# Patient Record
Sex: Male | Born: 1940 | Race: Black or African American | Hispanic: No | Marital: Married | Smoking: Former smoker
Health system: Southern US, Community
[De-identification: ages and names within clinical notes are randomized; demographics above are authoritative.]

## PROBLEM LIST (undated history)

## (undated) DIAGNOSIS — D509 Iron deficiency anemia, unspecified: Secondary | ICD-10-CM

## (undated) DIAGNOSIS — R609 Edema, unspecified: Secondary | ICD-10-CM

## (undated) DIAGNOSIS — M1990 Unspecified osteoarthritis, unspecified site: Secondary | ICD-10-CM

## (undated) DIAGNOSIS — R0609 Other forms of dyspnea: Secondary | ICD-10-CM

## (undated) DIAGNOSIS — R3915 Urgency of urination: Secondary | ICD-10-CM

## (undated) DIAGNOSIS — Z9862 Peripheral vascular angioplasty status: Secondary | ICD-10-CM

## (undated) DIAGNOSIS — E7800 Pure hypercholesterolemia, unspecified: Secondary | ICD-10-CM

## (undated) DIAGNOSIS — R600 Localized edema: Secondary | ICD-10-CM

## (undated) DIAGNOSIS — R350 Frequency of micturition: Secondary | ICD-10-CM

## (undated) DIAGNOSIS — D72829 Elevated white blood cell count, unspecified: Secondary | ICD-10-CM

## (undated) DIAGNOSIS — R011 Cardiac murmur, unspecified: Secondary | ICD-10-CM

## (undated) DIAGNOSIS — IMO0001 Reserved for inherently not codable concepts without codable children: Secondary | ICD-10-CM

## (undated) DIAGNOSIS — N529 Male erectile dysfunction, unspecified: Secondary | ICD-10-CM

## (undated) DIAGNOSIS — I2510 Atherosclerotic heart disease of native coronary artery without angina pectoris: Secondary | ICD-10-CM

## (undated) DIAGNOSIS — Z5189 Encounter for other specified aftercare: Secondary | ICD-10-CM

## (undated) DIAGNOSIS — I10 Essential (primary) hypertension: Secondary | ICD-10-CM

## (undated) DIAGNOSIS — I739 Peripheral vascular disease, unspecified: Secondary | ICD-10-CM

## (undated) DIAGNOSIS — C9210 Chronic myeloid leukemia, BCR/ABL-positive, not having achieved remission: Secondary | ICD-10-CM

## (undated) DIAGNOSIS — Z87438 Personal history of other diseases of male genital organs: Secondary | ICD-10-CM

## (undated) DIAGNOSIS — Z8679 Personal history of other diseases of the circulatory system: Secondary | ICD-10-CM

## (undated) DIAGNOSIS — I509 Heart failure, unspecified: Secondary | ICD-10-CM

## (undated) DIAGNOSIS — N189 Chronic kidney disease, unspecified: Secondary | ICD-10-CM

## (undated) DIAGNOSIS — M1A00X0 Idiopathic chronic gout, unspecified site, without tophus (tophi): Secondary | ICD-10-CM

## (undated) DIAGNOSIS — N179 Acute kidney failure, unspecified: Secondary | ICD-10-CM

## (undated) DIAGNOSIS — R0600 Dyspnea, unspecified: Secondary | ICD-10-CM

## (undated) DIAGNOSIS — K219 Gastro-esophageal reflux disease without esophagitis: Secondary | ICD-10-CM

## (undated) DIAGNOSIS — R351 Nocturia: Secondary | ICD-10-CM

## (undated) DIAGNOSIS — Z862 Personal history of diseases of the blood and blood-forming organs and certain disorders involving the immune mechanism: Secondary | ICD-10-CM

## (undated) DIAGNOSIS — C61 Malignant neoplasm of prostate: Secondary | ICD-10-CM

## (undated) DIAGNOSIS — M1A9XX0 Chronic gout, unspecified, without tophus (tophi): Secondary | ICD-10-CM

## (undated) HISTORY — DX: Personal history of other diseases of the circulatory system: Z86.79

## (undated) HISTORY — PX: OTHER SURGICAL HISTORY: SHX169

## (undated) HISTORY — PX: CARDIOVASCULAR STRESS TEST: SHX262

## (undated) HISTORY — PX: TRACHEOSTOMY: SUR1362

## (undated) HISTORY — PX: HEMORRHOID SURGERY: SHX153

## (undated) HISTORY — PX: TRANSTHORACIC ECHOCARDIOGRAM: SHX275

## (undated) HISTORY — PX: LUNG LOBECTOMY: SHX167

---

## 2004-04-03 ENCOUNTER — Ambulatory Visit: Payer: Self-pay | Admitting: Family Medicine

## 2004-05-08 ENCOUNTER — Ambulatory Visit: Payer: Self-pay | Admitting: Family Medicine

## 2004-10-15 ENCOUNTER — Ambulatory Visit: Payer: Self-pay | Admitting: Family Medicine

## 2005-01-08 ENCOUNTER — Ambulatory Visit: Payer: Self-pay | Admitting: Family Medicine

## 2005-04-02 ENCOUNTER — Ambulatory Visit: Payer: Self-pay | Admitting: Family Medicine

## 2007-03-24 ENCOUNTER — Ambulatory Visit: Payer: Self-pay | Admitting: Vascular Surgery

## 2007-04-07 ENCOUNTER — Ambulatory Visit (HOSPITAL_COMMUNITY): Admission: RE | Admit: 2007-04-07 | Discharge: 2007-04-07 | Payer: Self-pay | Admitting: Vascular Surgery

## 2007-04-07 ENCOUNTER — Ambulatory Visit: Payer: Self-pay | Admitting: Vascular Surgery

## 2007-05-17 ENCOUNTER — Encounter: Admission: RE | Admit: 2007-05-17 | Discharge: 2007-05-17 | Payer: Self-pay | Admitting: Vascular Surgery

## 2007-05-17 ENCOUNTER — Ambulatory Visit: Payer: Self-pay | Admitting: Vascular Surgery

## 2007-11-15 ENCOUNTER — Ambulatory Visit: Payer: Self-pay | Admitting: Vascular Surgery

## 2010-01-11 HISTORY — PX: CATARACT EXTRACTION W/ INTRAOCULAR LENS  IMPLANT, BILATERAL: SHX1307

## 2010-02-01 ENCOUNTER — Encounter: Payer: Self-pay | Admitting: Vascular Surgery

## 2010-05-26 NOTE — Assessment & Plan Note (Signed)
OFFICE VISIT   AARIZ, MAISH  DOB:  Apr 03, 1940                                       05/17/2007  ZOXWR#:[id number]   The patient returns for followup today.  He underwent angioplasty and  stenting of his left common iliac artery on March 27.  He states since  then his leg has improved significantly.  He is walking some but has not  really determined his precise walking distance at this point due to the  significant improvement in his leg.  His feet are much warmer he says as  well.   PHYSICAL EXAMINATION:  Vital signs:  On physical exam today blood  pressure is 176/83 in the left arm, pulse is 82 and regular.  Lower  extremities:  He has 2+ femoral pulses bilaterally.  Pedal pulses are  absent bilaterally.  Feet are warm and well-perfused.   Of note, he had findings of possible scar on his chest x-ray versus a  nodule.  Therefore, this was followed up with a chest CT which I  reviewed today.  This again showed right upper lobe scarring.   He had bilateral ABIs performed today which were 0.85 on the left  improved from 0.41 and 0.75 on the right improved from 0.59.  Overall,  the patient is significantly improved.  He will follow up in six months'  time for repeat ABIs and arterial duplex to make sure that he has had no  renarrowing of his iliac stent.  He was also referred to [facility] today for evaluation of erectile dysfunction.   Janetta Hora. Fields, MD  Electronically Signed   CEF/MEDQ  D:  05/18/2007  T:  05/18/2007  Job:  [id number]   cc:   Elease Hashimoto A. Benedetto Goad, M.D.  [facility]

## 2010-05-26 NOTE — Procedures (Signed)
AORTA-ILIAC DUPLEX EVALUATION   INDICATION:  Followup evaluation of lower extremity stent.   HISTORY:  Diabetes:  Yes.  Cardiac:  No.  Hypertension:  Yes.  Smoking:  Former smoker.  Previous Surgery:  Left common iliac artery PTA and stent on 04/07/2007.               SINGLE LEVEL ARTERIAL EXAM                              RIGHT                  LEFT  Brachial:                  156                    150  Anterior tibial:           96                     108  Posterior tibial:          126                    98  Peroneal:  Ankle/brachial index:      0.81                   0.72  Previous ABI/date:         05/17/2007, 0.75       05/17/2007, 0.85   AORTA-ILIAC DUPLEX EXAM  Aorta - Proximal     67 cm/s  Aorta - Mid          89 cm/s  Aorta - Distal       125 cm/s   RIGHT                                   LEFT  97 cm/s           CIA-PROXIMAL          167 cm/s  85 cm/s           CIA-DISTAL            84 cm/s  110 cm/s          HYPOGASTRIC           cm/s  101 cm/s          EIA-PROXIMAL          135 cm/s  189 cm/s          EIA-MID               132 cm/s  116 cm/s          EIA-DISTAL            113 cm/s   IMPRESSION:  1. ABIs are stable compared to previous study bilaterally.  2. No evidence of in-stent restenosis.   ___________________________________________  Janetta Hora Fields, MD   MC/MEDQ  D:  11/15/2007  T:  11/15/2007  Job:  [id number]
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
## 2010-05-26 NOTE — Assessment & Plan Note (Signed)
OFFICE VISIT   CLAYTON, BOSSERMAN  DOB:  03-29-1940                                       11/15/2007  UJWJX#:[id number]   The patient returns for follow-up today.  He underwent angioplasty and  stenting of his left common iliac artery in March 2009.  He states that  since then his claudication symptoms have resolved.  His primary  atherosclerotic risk factors include diabetes and hypertension.  He is a  former smoker and quit over 30 years ago.   On physical exam today, blood pressure is 155/79 in the left arm, pulse  is 71 and regular, temperature 98.  HEENT:  Unremarkable.  Neck:  Has 2+  carotid pulses without bruit.  Extremities:  He has 2+ femoral pulses  bilaterally.  He has absent popliteal and pedal pulses bilaterally.  He  had bilateral ABIs performed today which were 0.81 on the right and 0.72  on the left.  These are essentially unchanged from May 2009.  There was  no evidence of elevated velocity within the stent.   Overall, the patient is doing well.  His symptoms are stable and his  ABIs have not changed significantly since his stenting.  He will follow  up with me in 6 months' time for repeat ABIs or sooner if he has  recurrent claudication symptoms.   Janetta Hora. Fields, MD  Electronically Signed   CEF/MEDQ  D:  11/16/2007  T:  11/16/2007  Job:  [id number]   cc:   Elease Hashimoto A. Benedetto Goad, M.D.

## 2010-05-26 NOTE — Consult Note (Signed)
NEW PATIENT CONSULTATION   Dale Taylor, Dale Taylor  DOB:  July 16, 1940                                       03/24/2007  UYQIH#:[id number]   HISTORY:  The patient presents today for evaluation of lower extremity  discomfort.  He is an active 70 year old gentleman with a history of  progressive lower extremity claudication symptoms.  He reports that he  has left hip and buttock claudication and also some discomfort in both  calves with prolonged walking.  He also reports impotence.  He is a non-  insulin-dependent diabetic and he does have a history of hypertension.  He does not have a history of premature atherosclerotic disease in his  family.   SOCIAL HISTORY:  Significant for being married with 7 children.  He  works in Orthoptist.  He does not smoke, having quit 30 years ago.  He does  not drink alcohol on a regular basis.   REVIEW OF SYSTEMS:  Positive for shortness of breath with lying flat and  with exertion, pain in his legs with walking and also arthritis.   ALLERGIES:  He has no known drug allergies.   PHYSICAL EXAMINATION:  His blood pressure today is 154/64, pulse 82,  respirations 18.  His oxygen saturation is 95% on room air.  His carotid  artery is without bruits bilaterally.  He has 2+ radial pulses.  He has  diminished left femoral pulse and 2+ right femoral pulse.  He has absent  popliteal and distal pulses bilaterally.  Heart regular rate and rhythm.  Chest is clear bilaterally.  He has no tissue loss in his lower  extremities.   He underwent noninvasive vascular laboratory studies in our office today  and this reveals an ankle arm index of 0.59 on the right and 0.41 on the  left.  He has a copy of his CT [facility].  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  I  discussed this at length with the patient.  I suspect that he will  get  excellent relief of his hip claudication with angioplasty treatment of  his tight stenosis.  He prefers treatment and doctors visits on Fridays  from a work standpoint so therefore we will schedule him for outpatient  arteriography and probable angioplasty with Dr. Fabienne Bruns at [facility] on March 27.   Larina Earthly, M.D.  Electronically Signed   TFE/MEDQ  D:  03/24/2007  T:  03/27/2007  Job:  [id number]   cc:   Elease Hashimoto A. Benedetto Goad, M.D.

## 2010-10-05 LAB — CBC
HCT: 37 — ABNORMAL LOW
Hemoglobin: 12.5 — ABNORMAL LOW
RBC: 4.1 — ABNORMAL LOW
WBC: 4.6

## 2010-10-05 LAB — COMPREHENSIVE METABOLIC PANEL
ALT: 15
Alkaline Phosphatase: 61
BUN: 19
CO2: 23
Chloride: 107
Glucose, Bld: 115 — ABNORMAL HIGH
Potassium: 5.3 — ABNORMAL HIGH
Sodium: 136
Total Bilirubin: 0.6
Total Protein: 6.5

## 2010-10-05 LAB — PROTIME-INR: Prothrombin Time: 13.5

## 2010-10-09 ENCOUNTER — Other Ambulatory Visit: Payer: Self-pay | Admitting: Oncology

## 2010-10-09 ENCOUNTER — Other Ambulatory Visit (HOSPITAL_COMMUNITY)
Admission: RE | Admit: 2010-10-09 | Discharge: 2010-10-09 | Disposition: A | Discharge status: Home / Self Care | Payer: Medicare Other | Source: Ambulatory Visit | Attending: Oncology | Admitting: Oncology

## 2010-10-09 DIAGNOSIS — D72829 Elevated white blood cell count, unspecified: Secondary | ICD-10-CM | POA: Insufficient documentation

## 2010-11-18 ENCOUNTER — Encounter: Payer: Self-pay | Admitting: Radiation Oncology

## 2010-11-18 DIAGNOSIS — E7800 Pure hypercholesterolemia, unspecified: Secondary | ICD-10-CM | POA: Insufficient documentation

## 2010-11-19 ENCOUNTER — Ambulatory Visit: Payer: Medicare Other | Admitting: Radiation Oncology

## 2010-11-19 ENCOUNTER — Encounter: Payer: Self-pay | Admitting: Radiation Oncology

## 2010-11-19 ENCOUNTER — Ambulatory Visit
Admission: RE | Admit: 2010-11-19 | Discharge: 2010-11-19 | Disposition: A | Discharge status: Home / Self Care | Payer: Medicare Other | Source: Ambulatory Visit | Attending: Radiation Oncology | Admitting: Radiation Oncology

## 2010-11-19 ENCOUNTER — Ambulatory Visit: Payer: Medicare Other

## 2010-11-19 VITALS — BP 154/64 | HR 76 | Temp 97.8°F | Resp 20 | Ht 71.0 in | Wt 229.9 lb

## 2010-11-19 DIAGNOSIS — E119 Type 2 diabetes mellitus without complications: Secondary | ICD-10-CM | POA: Insufficient documentation

## 2010-11-19 DIAGNOSIS — I10 Essential (primary) hypertension: Secondary | ICD-10-CM | POA: Insufficient documentation

## 2010-11-19 DIAGNOSIS — C61 Malignant neoplasm of prostate: Secondary | ICD-10-CM | POA: Insufficient documentation

## 2010-11-19 DIAGNOSIS — Z79899 Other long term (current) drug therapy: Secondary | ICD-10-CM | POA: Insufficient documentation

## 2010-11-19 DIAGNOSIS — E7800 Pure hypercholesterolemia, unspecified: Secondary | ICD-10-CM | POA: Insufficient documentation

## 2010-11-19 HISTORY — DX: Pure hypercholesterolemia, unspecified: E78.00

## 2010-11-19 HISTORY — DX: Unspecified osteoarthritis, unspecified site: M19.90

## 2010-11-19 HISTORY — DX: Essential (primary) hypertension: I10

## 2010-11-19 HISTORY — DX: Malignant neoplasm of prostate: C61

## 2010-11-19 NOTE — Progress Notes (Signed)
Encounter addended by: Lowella Petties, RN on: 11/19/2010  1:26 PM<BR>     Documentation filed: Inpatient Document Flowsheet

## 2010-11-19 NOTE — Progress Notes (Signed)
Encounter addended by: Lowella Petties, RN on: 11/19/2010  1:34 PM<BR>     Documentation filed: Charges VN

## 2010-11-19 NOTE — Progress Notes (Signed)
Please see the Nurse Progress Note in the MD Initial Consult Encounter for this patient. 

## 2010-11-19 NOTE — Progress Notes (Signed)
[facility] Radiation Oncology NEW PATIENT EVALUATION  Name: Dale Taylor MRN: [id number]  Date: 11/19/2010  DOB: 02/10/1940  Status:outpatient    CC  Debroah Baller, MD (Urology),  Donnel Saxon (Primary Care)    REFERRING PHYSICIAN: Debroah Baller, MD   DIAGNOSIS: Stage TI C. favorable risk adenocarcinoma prostate   HISTORY OF PRESENT ILLNESS::Dale Taylor is a 70 y.o. male who is seen today for the courtesy of Dr. 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 He was noted to have a PSA of 8.4 and a PCA 3 of 101.6. He was seen by Dr. Saddie Benders who performed ultrasound-guided biopsies of the prostate on 10/21/2010. He was found to have Gleason's 6 (3+3) involving 10% of a core from the medial left base and 29% of a core from the lateral left base. His gland volume was 82.6 cc and prosthetic length 5.4 cm. He is doing reasonably well from a GU and GI standpoint. His IPS S. score is 6. He does have erectile dysfunction. A CT scan of the abdomen and pelvis on 11/04/2010 was without evidence for metastatic disease.    PREVIOUS RADIATION THERAPY: No   PAST MEDICAL HISTORY:  has a past medical history of Prostate cancer; Hypertension; Hypercholesterolemia; Diabetes mellitus; Arthritis; Cataract; and Heartburn.     PAST SURGICAL HISTORY: Past Surgical History  Procedure Date  . Lung lobectomy     RIGHT LOWER LOBECTOMY 20 YEARS AGO, NOT CANCER     ETIOLOGIC FACTORS:   FAMILY HISTORY: family history includes Cancer in his maternal aunt.   SOCIAL HISTORY:  reports that he quit smoking about 32 years ago. His smoking use included Cigarettes. He smoked 1.5 packs per day. He does not have any smokeless tobacco history on file. He reports that he does not drink alcohol or use illicit drugs.   ALLERGIES: Review of patient's allergies indicates no known allergies.   MEDICATIONS:Current outpatient  prescriptions:allopurinol (ZYLOPRIM) 300 MG tablet, Take 300 mg by mouth daily.  , Disp: , Rfl: ;  aspirin 81 MG chewable tablet, Chew 81 mg by mouth daily.  , Disp: , Rfl: ;  doxazosin (CARDURA) 8 MG tablet, Take 8 mg by mouth at bedtime.  , Disp: , Rfl: ;  furosemide (LASIX) 20 MG tablet, Take 20 mg by mouth 2 (two) times daily.  , Disp: , Rfl:  glimepiride (AMARYL) 4 MG tablet, Take 4 mg by mouth daily before breakfast.  , Disp: , Rfl: ;  labetalol (NORMODYNE) 100 MG tablet, Take 100 mg by mouth daily.  , Disp: , Rfl: ;  omeprazole (PRILOSEC) 40 MG capsule, Take 40 mg by mouth daily.  , Disp: , Rfl: ;  simvastatin (ZOCOR) 40 MG tablet, Take 40 mg by mouth at bedtime.  , Disp: , Rfl: ;  Tamsulosin HCl (FLOMAX) 0.4 MG CAPS, Take 0.4 mg by mouth daily.  , Disp: , Rfl:  traMADol (ULTRAM) 50 MG tablet, Take 50 mg by mouth every 6 (six) hours as needed. Maximum dose= 8 tablets per day , Disp: , Rfl: ;  vitamin B-12 (CYANOCOBALAMIN) 1000 MCG tablet, Take 1,000 mcg by mouth daily.  , Disp: , Rfl: ;  vitamin E 200 UNIT capsule, Take 200 Units by mouth daily.  , Disp: , Rfl: ;  vitamin E 400 UNIT capsule, Take 400 Units by mouth daily.  , Disp: , Rfl:    REVIEW OF SYSTEMS:  A comprehensive review of systems  was negative.    PHYSICAL EXAM:  height is 5\' 11"  (1.803 m) and weight is 229 lb 14.4 oz (104.282 kg). His temperature is 97.8 F (36.6 C). His blood pressure is 154/64 and his pulse is 76. His respiration is 20.   Alert and oriented. Head and neck examination unremarkable. Nodes: Without palpable cervical or supraclavicular lymphadenopathy. Chest: Lungs clear. Back: Without spinal or CVA tenderness. Heart: Regular rate and rhythm. Abdomen: Without masses or organomegaly. Genitalia: Unremarkable to inspection. Rectal: The prostate gland is moderately enlarged and is without focal induration or nodularity. Extremities without edema. Neurologic examination: Grossly nonfocal.   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 I explained to the patient that his prognosis is related to his stage, Gleason score, and PSA level. All are favorable. His management options include surgery versus close observation versus radiation therapy. I spent a good deal of time discussing his radiation therapy options which include prostate seed implantation or external beam/IMR T. After a lengthy discussion, he is most interested in prostate seed implantation. Because of his large gland size, he will have to be downsized prior to seed implantation. I spoke with Dr. Cindee Salt nurse and she will get this scheduled. I discussed the potential acute and late toxicities of prostate seed implantation with the patient and he wishes to proceed as outlined. He was given literature for review. I will see the patient back for a followup visit in approximately 3 months.   PLAN: As above. Followup visit with me in 3 months.  I spent 60 minutes minutes face to face with the patient and more than 50% of that time was spent in counseling and/or coordination of care.

## 2010-11-20 NOTE — Progress Notes (Signed)
Encounter addended by: Amanda Pea, RN on: 11/20/2010  4:39 PM<BR>     Documentation filed: Charges VN

## 2010-11-20 NOTE — Progress Notes (Signed)
Encounter addended by: Lowella Petties, RN on: 11/20/2010  9:19 AM<BR>     Documentation filed: Charges VN

## 2011-02-17 ENCOUNTER — Telehealth: Payer: Self-pay | Admitting: *Deleted

## 2011-02-23 ENCOUNTER — Ambulatory Visit: Payer: Medicare Other

## 2011-02-23 ENCOUNTER — Ambulatory Visit: Payer: Medicare Other | Admitting: Radiation Oncology

## 2011-02-25 ENCOUNTER — Encounter: Payer: Self-pay | Admitting: Radiation Oncology

## 2011-02-25 ENCOUNTER — Ambulatory Visit
Admission: RE | Admit: 2011-02-25 | Discharge: 2011-02-25 | Disposition: A | Discharge status: Home / Self Care | Payer: Medicare Other | Source: Ambulatory Visit | Attending: Radiation Oncology | Admitting: Radiation Oncology

## 2011-02-25 VITALS — BP 162/74 | HR 73 | Temp 97.1°F | Resp 18 | Ht 71.0 in | Wt 220.8 lb

## 2011-02-25 DIAGNOSIS — Z79899 Other long term (current) drug therapy: Secondary | ICD-10-CM | POA: Insufficient documentation

## 2011-02-25 DIAGNOSIS — E7800 Pure hypercholesterolemia, unspecified: Secondary | ICD-10-CM | POA: Insufficient documentation

## 2011-02-25 DIAGNOSIS — C61 Malignant neoplasm of prostate: Secondary | ICD-10-CM

## 2011-02-25 DIAGNOSIS — E119 Type 2 diabetes mellitus without complications: Secondary | ICD-10-CM | POA: Insufficient documentation

## 2011-02-25 DIAGNOSIS — I10 Essential (primary) hypertension: Secondary | ICD-10-CM | POA: Insufficient documentation

## 2011-02-25 NOTE — Progress Notes (Signed)
Patient presents to the clinic today unaccompanied for a follow up new consult with Dr. [name] reference prostate cancer. Patient is alert and oriented to person, place, and time. No distress noted. Steady gait noted. Pleasant affect noted. Patient denies pain at this time. Patient denies burning upon urination. Patient reports that is urine stream is neither weak or strong. Patient reports that he gets up every hour during the night to void. Patient denies hematuria. Patient denies nausea, vomiting, diarrhea, headaches or dizziness. Patient reports that he is "blessed and feels fine." IPSS score of 17 noted. Reported all findings to Dr. [name]. Patient reports his daughter is waiting in the upstairs lobby.

## 2011-02-25 NOTE — Progress Notes (Signed)
Please see the Nurse Progress Note in the MD Initial Consult Encounter for this patient. 

## 2011-02-25 NOTE — Progress Notes (Signed)
Followup note: Diagnosis Stage TI C. favorable risk adenocarcinoma prostate  History: Dale Taylor returns today for review and scheduling of his prostate seed implant after downsizing. I saw the patient in consultation on November 8. His gland volume at the time of his biopsy was 82.6 cc with a prostatic length 5.4 cm.  He started [place] just over 2 months ago. His I PSS score over 2 months ago was 6 and it is 14 today. He does have nocturia x3. He is without GI difficulties. He tells me he has been under the care of Dr. Melvyn Neth at [facility] for [facility]. I spoke with Dr. 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  Physical examination: Rectal the prostate gland is clearly smaller on digital examination. No masses or induration appreciated.  Impression: Stage TI C. favorable risk adenocarcinoma prostate. He had a CT arch study today which shows excellent downsizing with a prostate volume of 53 cc. His prosthetic length is 5.4 cm which includes a prominent median bar. His pubic arch is open widely. He is a candidate for prostate seed implantation with the exception of his prosthetic length approaching 5.5 cm. We recently ordered a new probe within extended focal length which should allow [place] to implant patient's with a prosthetic length up to 6 cm. This should be available in 2 months. I will ask Dr. Saddie Benders to continue with his androgen deprivation therapy for at least 2 more months until the new probe arrives and get him scheduled for his implant. As mentioned above, I spoke with Dr. 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 He suspects that he will die from something else. I discussed in detail the potential acute and late toxicities of radiation therapy. Consent was signed today.   Plan: As discussed above. I'll see by Dr. Saddie Benders about delaying his implant a few more months and continuing with his androgen deprivation  therapy. I will order his seeds, which have a 2 week delivery  time once the new probe has arrived.  30 minutes was spent face-to-face with the patient, primarily counseling the patient and coordinating his care.

## 2011-02-26 NOTE — Progress Notes (Signed)
Encounter addended by: Ardell Isaacs, RN on: 02/26/2011  4:59 PM<BR>     Documentation filed: Inpatient Patient Education, Charges VN

## 2011-03-05 NOTE — Telephone Encounter (Signed)
XXXX 

## 2011-03-05 NOTE — Telephone Encounter (Signed)
xxx

## 2011-03-18 ENCOUNTER — Other Ambulatory Visit (HOSPITAL_COMMUNITY)
Admission: RE | Admit: 2011-03-18 | Discharge: 2011-03-18 | Disposition: A | Discharge status: Home / Self Care | Payer: Medicare Other | Source: Ambulatory Visit | Attending: Oncology | Admitting: Oncology

## 2011-03-18 DIAGNOSIS — C9110 Chronic lymphocytic leukemia of B-cell type not having achieved remission: Secondary | ICD-10-CM | POA: Insufficient documentation

## 2011-03-18 DIAGNOSIS — D649 Anemia, unspecified: Secondary | ICD-10-CM | POA: Insufficient documentation

## 2011-05-11 ENCOUNTER — Ambulatory Visit
Admission: RE | Admit: 2011-05-11 | Discharge: 2011-05-11 | Disposition: A | Discharge status: Home / Self Care | Payer: Medicare Other | Source: Ambulatory Visit | Attending: Radiation Oncology | Admitting: Radiation Oncology

## 2011-05-11 ENCOUNTER — Encounter: Payer: Self-pay | Admitting: Radiation Oncology

## 2011-05-11 VITALS — BP 183/66 | HR 70 | Resp 18 | Ht 71.0 in | Wt 226.0 lb

## 2011-05-11 DIAGNOSIS — C61 Malignant neoplasm of prostate: Secondary | ICD-10-CM

## 2011-05-11 LAB — COMPREHENSIVE METABOLIC PANEL
ALT: 14 U/L (ref 0–53)
AST: 17 U/L (ref 0–37)
Albumin: 4 g/dL (ref 3.5–5.2)
Alkaline Phosphatase: 52 U/L (ref 39–117)
CO2: 26 mEq/L (ref 19–32)
Chloride: 105 mEq/L (ref 96–112)
Creatinine, Ser: 1.53 mg/dL — ABNORMAL HIGH (ref 0.50–1.35)
GFR calc non Af Amer: 44 mL/min — ABNORMAL LOW (ref >90)
Potassium: 5.5 mEq/L — ABNORMAL HIGH (ref 3.5–5.1)
Total Bilirubin: 0.2 mg/dL — ABNORMAL LOW (ref 0.3–1.2)

## 2011-05-11 LAB — CBC
HCT: 31.2 % — ABNORMAL LOW (ref 39.0–52.0)
MCV: 91.2 fL (ref 78.0–100.0)
Platelets: 186 10*3/uL (ref 150–400)
RBC: 3.42 MIL/uL — ABNORMAL LOW (ref 4.22–5.81)
RDW: 14.4 % (ref 11.5–15.5)
WBC: 6.7 10*3/uL (ref 4.0–10.5)

## 2011-05-11 LAB — PROTIME-INR: INR: 0.99 (ref 0.00–1.49)

## 2011-05-11 NOTE — Progress Notes (Signed)
Patient presents to the clinic today for a follow up post seed with Dr. [name] reference prostate ca. Patient is alert and oriented to person, place, and time. No distress noted. Steady gait noted. Pleasant affect noted. Patient denies pain at this time. Patient denies burning with urination or hematuria. Patient reports urgency. Patient reports taking flomax and proscar as directed. Patient's blood pressure elevated. Patient reports his PCP is aware of the elevated bp and working to find a medication that works for him. Patient reports on average he gets up to void four times per night or less. Reported all findings to Dr. [name].

## 2011-05-11 NOTE — Progress Notes (Signed)
Followup note:  [name] returns today for a repeat CT arch study to measure his prostatic length. He has a known median bar. His CT arch study today shows a prostate volume of approximately 45 cc and a prosthetic length approaching 5.0 cm. He is without new GU or GI difficulties.  Physical examination not performed.  Impression: Stage TI C. favorable risk adenocarcinoma prostate.  Plan: He is scheduled for seed implantation with Dr. Albin Felling on May 7. I will check to confirm that his consent has been previously signed.  15 minutes was spent face-to-face with the patient reviewing his recent CT arch study. I met with his daughter as well and answered all of their questions.

## 2011-05-11 NOTE — Progress Notes (Signed)
His CT arch study note: The patient underwent a CT arch study, having continued with "downsizing" prior to his implant which is tentatively scheduled for next week. His prostate volume was 45 cc with a prosthetic length approaching 5.0 cm. The arch remains open.

## 2011-05-11 NOTE — Progress Notes (Signed)
Met with patient to discuss RO billing.  Patient had no concerns today. 

## 2011-05-14 ENCOUNTER — Encounter (HOSPITAL_BASED_OUTPATIENT_CLINIC_OR_DEPARTMENT_OTHER): Payer: Self-pay | Admitting: *Deleted

## 2011-05-14 NOTE — Progress Notes (Addendum)
NPO AFTER MN WITH EXCEPTION CLEAR LIQUIDS (NO CREAM/ MILK PRODUCTS) UNTIL 0800. ARRIVES AT 1245. CURRENT LAB RESULTS, CXR AND EKG IN EPIC AND IN CHART. AND D/C NOTE AND ECHO REPORT W/ CHART THAT WAS REQUESTED FROM [facility]. WILL TAKE LABETOLOL, ISOSORBIDE, ZANTAC, PRILOSEC, AND SPRYCEL AM OF SURG. W/ SIPS OF WATER AND DO FLEET ENEMA.  REVIEWED CHART W/ DR SCHUSTER MDA.  DUE TO RECENT CHF AND DOCUMENTED CAD AND NO FOLLOW-UP, PT NEEDS CARDIAC CLEARANCE. NOTIFIED DEBORAH AT DR [facility] OFFICE, FAXED ABNORMAL LABS AND INFORMED THEM OF PT HX AND NEED OF CLEARANCE.

## 2011-05-17 ENCOUNTER — Telehealth: Payer: Self-pay | Admitting: *Deleted

## 2011-05-17 NOTE — Progress Notes (Signed)
Spoke w/ daughter Laurelyn Sickle.  Instructions reviewed w daughter per her request. She was also informed of cardiac clearance ordered by Dr. Shireen Quan .  She understands that if clearance not obtained, surgery may be cancelled.

## 2011-05-17 NOTE — Telephone Encounter (Signed)
Called patient to remind of procedure for 05-18-11, lvm for a return call

## 2011-05-18 ENCOUNTER — Ambulatory Visit (HOSPITAL_BASED_OUTPATIENT_CLINIC_OR_DEPARTMENT_OTHER)
Admission: RE | Admit: 2011-05-18 | Discharge: 2011-05-18 | Disposition: A | Discharge status: Home / Self Care | Payer: Medicare Other | Source: Ambulatory Visit | Attending: Urology | Admitting: Urology

## 2011-05-18 ENCOUNTER — Encounter (HOSPITAL_BASED_OUTPATIENT_CLINIC_OR_DEPARTMENT_OTHER)
Admission: RE | Disposition: A | Discharge status: Home / Self Care | Payer: Self-pay | Source: Ambulatory Visit | Attending: Urology

## 2011-05-18 DIAGNOSIS — Z01812 Encounter for preprocedural laboratory examination: Secondary | ICD-10-CM | POA: Insufficient documentation

## 2011-05-18 HISTORY — DX: Encounter for other specified aftercare: Z51.89

## 2011-05-18 HISTORY — DX: Peripheral vascular disease, unspecified: I73.9

## 2011-05-18 HISTORY — DX: Edema, unspecified: R60.9

## 2011-05-18 HISTORY — DX: Peripheral vascular angioplasty status: Z98.62

## 2011-05-18 HISTORY — DX: Localized edema: R60.0

## 2011-05-18 HISTORY — DX: Chronic gout, unspecified, without tophus (tophi): M1A.9XX0

## 2011-05-18 HISTORY — DX: Frequency of micturition: R35.0

## 2011-05-18 HISTORY — DX: Gastro-esophageal reflux disease without esophagitis: K21.9

## 2011-05-18 HISTORY — DX: Idiopathic chronic gout, unspecified site, without tophus (tophi): M1A.00X0

## 2011-05-18 HISTORY — DX: Chronic kidney disease, unspecified: N18.9

## 2011-05-18 HISTORY — DX: Urgency of urination: R39.15

## 2011-05-18 HISTORY — DX: Male erectile dysfunction, unspecified: N52.9

## 2011-05-18 HISTORY — DX: Other forms of dyspnea: R06.09

## 2011-05-18 HISTORY — DX: Personal history of diseases of the blood and blood-forming organs and certain disorders involving the immune mechanism: Z86.2

## 2011-05-18 HISTORY — DX: Cardiac murmur, unspecified: R01.1

## 2011-05-18 HISTORY — DX: Nocturia: R35.1

## 2011-05-18 HISTORY — DX: Elevated white blood cell count, unspecified: D72.829

## 2011-05-18 HISTORY — DX: Chronic myeloid leukemia, BCR/ABL-positive, not having achieved remission: C92.10

## 2011-05-18 HISTORY — DX: Iron deficiency anemia, unspecified: D50.9

## 2011-05-18 HISTORY — DX: Personal history of other diseases of male genital organs: Z87.438

## 2011-05-18 HISTORY — DX: Atherosclerotic heart disease of native coronary artery without angina pectoris: I25.10

## 2011-05-18 HISTORY — DX: Reserved for inherently not codable concepts without codable children: IMO0001

## 2011-05-18 HISTORY — DX: Heart failure, unspecified: I50.9

## 2011-05-18 HISTORY — DX: Dyspnea, unspecified: R06.00

## 2011-05-18 SURGERY — INSERTION, RADIATION SOURCE, PROSTATE
Anesthesia: General

## 2011-06-03 ENCOUNTER — Encounter: Payer: Self-pay | Admitting: Radiation Oncology

## 2011-06-03 NOTE — Progress Notes (Signed)
Chart note: I spoke with Dr. Bing Matter, his cardiologist, [phone] who cleared him from a cardiac standpoint. He has no cardiac ischemia and he has an ejection fraction of 55%. 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 I'm told that he should avoid hypotension at the time of general anesthesia.  Plan: I will go ahead and get him reschedule for seed implantation with Dr. Albin Felling.

## 2011-06-08 ENCOUNTER — Telehealth: Payer: Self-pay | Admitting: *Deleted

## 2011-06-08 NOTE — Telephone Encounter (Signed)
CALLED PATIENT TO INFORM OF IMPLANT ON 07-21-11 AT 2:00 PM, SPOKE WITH PATIENT AND HE IS AWARE OF THIS PROCEDURE

## 2011-07-09 ENCOUNTER — Encounter (HOSPITAL_BASED_OUTPATIENT_CLINIC_OR_DEPARTMENT_OTHER): Payer: Self-pay | Admitting: *Deleted

## 2011-07-09 NOTE — Progress Notes (Addendum)
Spoke with Dale Taylor in Dr Epifanio Lesches office-requested  his cardiac work up,also made aware he stated he made arrangements to have lab work in the office on 07/16/11. To [facility] at 1245-Npo after mn-will take nexium,imdur,sprycel with small  amt water that am.Clear liquids until 0700,then Npo.  REVIEWED CHART W/ DR DENNENEY MDA, WITH CARDIOLOGIST CLEARENCE AND STRESS TEST REPORT, OK TO PROCEED.

## 2011-07-12 ENCOUNTER — Encounter (HOSPITAL_BASED_OUTPATIENT_CLINIC_OR_DEPARTMENT_OTHER): Payer: Self-pay | Admitting: *Deleted

## 2011-07-13 ENCOUNTER — Telehealth: Payer: Self-pay | Admitting: *Deleted

## 2011-07-13 NOTE — Telephone Encounter (Signed)
CALLED PATIENT TO REMIND OF APPT., LVM FOR A RETURN CALL 

## 2011-07-20 ENCOUNTER — Telehealth: Payer: Self-pay | Admitting: *Deleted

## 2011-07-20 NOTE — Telephone Encounter (Signed)
Called patient to remind of procedure for 07-21-11, lvm for a return call

## 2011-07-21 ENCOUNTER — Encounter (HOSPITAL_BASED_OUTPATIENT_CLINIC_OR_DEPARTMENT_OTHER): Payer: Self-pay | Admitting: Urology

## 2011-07-21 ENCOUNTER — Ambulatory Visit (HOSPITAL_BASED_OUTPATIENT_CLINIC_OR_DEPARTMENT_OTHER)
Admission: RE | Admit: 2011-07-21 | Discharge: 2011-07-21 | Disposition: A | Discharge status: Home / Self Care | Payer: Medicare Other | Source: Ambulatory Visit | Attending: Urology | Admitting: Urology

## 2011-07-21 ENCOUNTER — Ambulatory Visit (HOSPITAL_COMMUNITY): Payer: Medicare Other

## 2011-07-21 ENCOUNTER — Encounter: Payer: Self-pay | Admitting: Radiation Oncology

## 2011-07-21 ENCOUNTER — Encounter (HOSPITAL_BASED_OUTPATIENT_CLINIC_OR_DEPARTMENT_OTHER)
Admission: RE | Disposition: A | Discharge status: Home / Self Care | Payer: Self-pay | Source: Ambulatory Visit | Attending: Urology

## 2011-07-21 ENCOUNTER — Encounter (HOSPITAL_BASED_OUTPATIENT_CLINIC_OR_DEPARTMENT_OTHER): Payer: Self-pay | Admitting: *Deleted

## 2011-07-21 ENCOUNTER — Encounter (HOSPITAL_BASED_OUTPATIENT_CLINIC_OR_DEPARTMENT_OTHER): Payer: Self-pay | Admitting: Anesthesiology

## 2011-07-21 ENCOUNTER — Ambulatory Visit (HOSPITAL_BASED_OUTPATIENT_CLINIC_OR_DEPARTMENT_OTHER): Payer: Medicare Other | Admitting: Anesthesiology

## 2011-07-21 DIAGNOSIS — E119 Type 2 diabetes mellitus without complications: Secondary | ICD-10-CM | POA: Insufficient documentation

## 2011-07-21 DIAGNOSIS — C61 Malignant neoplasm of prostate: Secondary | ICD-10-CM | POA: Insufficient documentation

## 2011-07-21 DIAGNOSIS — I10 Essential (primary) hypertension: Secondary | ICD-10-CM | POA: Insufficient documentation

## 2011-07-21 DIAGNOSIS — E7800 Pure hypercholesterolemia, unspecified: Secondary | ICD-10-CM

## 2011-07-21 DIAGNOSIS — Z87891 Personal history of nicotine dependence: Secondary | ICD-10-CM | POA: Insufficient documentation

## 2011-07-21 DIAGNOSIS — Z902 Acquired absence of lung [part of]: Secondary | ICD-10-CM | POA: Insufficient documentation

## 2011-07-21 DIAGNOSIS — Z79899 Other long term (current) drug therapy: Secondary | ICD-10-CM | POA: Insufficient documentation

## 2011-07-21 HISTORY — PX: CYSTOSCOPY: SHX5120

## 2011-07-21 HISTORY — PX: RADIOACTIVE SEED IMPLANT: SHX5150

## 2011-07-21 SURGERY — INSERTION, RADIATION SOURCE, PROSTATE
Anesthesia: General | Site: Prostate | Wound class: Clean Contaminated

## 2011-07-21 MED ORDER — GENTAMICIN IN SALINE 1.6-0.9 MG/ML-% IV SOLN
80.0000 mg | Freq: Once | INTRAVENOUS | Status: AC
  Administered 2011-07-21: 80 mg via INTRAVENOUS

## 2011-07-21 MED ORDER — BELLADONNA ALKALOIDS-OPIUM 16.2-60 MG RE SUPP
RECTAL | Status: DC | PRN
  Administered 2011-07-21: 1 via RECTAL

## 2011-07-21 MED ORDER — SODIUM CHLORIDE 0.9 % IV SOLN
10.0000 mg | INTRAVENOUS | Status: DC | PRN
  Administered 2011-07-21: 50 ug/min via INTRAVENOUS

## 2011-07-21 MED ORDER — LIDOCAINE HCL (CARDIAC) 20 MG/ML IV SOLN
INTRAVENOUS | Status: DC | PRN
  Administered 2011-07-21: 60 mg via INTRAVENOUS

## 2011-07-21 MED ORDER — FENTANYL CITRATE 0.05 MG/ML IJ SOLN: 25.0000 ug | INTRAMUSCULAR | Status: DC | PRN

## 2011-07-21 MED ORDER — FLEET ENEMA 7-19 GM/118ML RE ENEM: 1.0000 | ENEMA | Freq: Once | RECTAL | Status: DC

## 2011-07-21 MED ORDER — PHENYLEPHRINE HCL 10 MG/ML IJ SOLN
INTRAMUSCULAR | Status: DC | PRN
  Administered 2011-07-21 (×5): 40 ug via INTRAVENOUS

## 2011-07-21 MED ORDER — CIPROFLOXACIN HCL 500 MG PO TABS: 500.0000 mg | ORAL_TABLET | Freq: Two times a day (BID) | ORAL | Status: AC

## 2011-07-21 MED ORDER — FENTANYL CITRATE 0.05 MG/ML IJ SOLN
INTRAMUSCULAR | Status: DC | PRN
  Administered 2011-07-21: 50 ug via INTRAVENOUS
  Administered 2011-07-21 (×6): 25 ug via INTRAVENOUS

## 2011-07-21 MED ORDER — STERILE WATER FOR IRRIGATION IR SOLN
Status: DC | PRN
  Administered 2011-07-21: 3000 mL

## 2011-07-21 MED ORDER — LACTATED RINGERS IV SOLN
INTRAVENOUS | Status: DC
  Administered 2011-07-21: 100 mL/h via INTRAVENOUS

## 2011-07-21 MED ORDER — OXYCODONE HCL 5 MG PO TABS: 5.0000 mg | ORAL_TABLET | Freq: Four times a day (QID) | ORAL | Status: AC | PRN

## 2011-07-21 MED ORDER — PROPOFOL 10 MG/ML IV EMUL
INTRAVENOUS | Status: DC | PRN
  Administered 2011-07-21: 150 mg via INTRAVENOUS
  Administered 2011-07-21: 50 mg via INTRAVENOUS

## 2011-07-21 MED ORDER — IOHEXOL 350 MG/ML SOLN
INTRAVENOUS | Status: DC | PRN
  Administered 2011-07-21: 7 mL via INTRAVENOUS

## 2011-07-21 MED ORDER — STERILE WATER FOR IRRIGATION IR SOLN
Status: DC | PRN
  Administered 2011-07-21: 500 mL

## 2011-07-21 MED ORDER — ONDANSETRON HCL 4 MG/2ML IJ SOLN
INTRAMUSCULAR | Status: DC | PRN
  Administered 2011-07-21: 4 mg via INTRAVENOUS

## 2011-07-21 MED ORDER — CIPROFLOXACIN IN D5W 400 MG/200ML IV SOLN
400.0000 mg | INTRAVENOUS | Status: AC
  Administered 2011-07-21: 400 mg via INTRAVENOUS

## 2011-07-21 MED ORDER — DEXAMETHASONE SODIUM PHOSPHATE 4 MG/ML IJ SOLN
INTRAMUSCULAR | Status: DC | PRN
  Administered 2011-07-21: 5 mg via INTRAVENOUS

## 2011-07-21 SURGICAL SUPPLY — 28 items
BLADE SURG ROTATE 9660 (MISCELLANEOUS) ×3 IMPLANT
CATH FOLEY 2WAY SLVR  5CC 16FR (CATHETERS) ×2
CATH FOLEY 2WAY SLVR 5CC 16FR (CATHETERS) ×4 IMPLANT
CATH ROBINSON RED A/P 20FR (CATHETERS) ×3 IMPLANT
CLOTH BEACON ORANGE TIMEOUT ST (SAFETY) ×3 IMPLANT
COVER MAYO STAND STRL (DRAPES) ×3 IMPLANT
COVER TABLE BACK 60X90 (DRAPES) ×3 IMPLANT
DRSG TEGADERM 4X4.75 (GAUZE/BANDAGES/DRESSINGS) ×3 IMPLANT
DRSG TEGADERM 8X12 (GAUZE/BANDAGES/DRESSINGS) ×3 IMPLANT
GLOVE BIO SURGEON STRL SZ7.5 (GLOVE) ×5 IMPLANT
GLOVE BIOGEL M 6.5 STRL (GLOVE) ×1 IMPLANT
GLOVE BIOGEL PI IND STRL 6.5 (GLOVE) IMPLANT
GLOVE BIOGEL PI INDICATOR 6.5 (GLOVE) ×1
GLOVE ECLIPSE 6.0 STRL STRAW (GLOVE) ×1 IMPLANT
GLOVE ECLIPSE 8.0 STRL XLNG CF (GLOVE) IMPLANT
GLOVE SURG SIGNA 7.5 PF LTX (GLOVE) ×6 IMPLANT
GOWN PREVENTION PLUS LG XLONG (DISPOSABLE) ×2 IMPLANT
GOWN STRL REIN XL XLG (GOWN DISPOSABLE) ×2 IMPLANT
GOWN SURGICAL LARGE (GOWNS) ×1 IMPLANT
GOWN SURGICAL XLG (GOWNS) ×1 IMPLANT
HOLDER FOLEY CATH W/STRAP (MISCELLANEOUS) ×3 IMPLANT
PACK CYSTOSCOPY (CUSTOM PROCEDURE TRAY) ×3 IMPLANT
SYRINGE 10CC LL (SYRINGE) ×3 IMPLANT
TOWEL NATURAL 6PK STERILE (DISPOSABLE) ×2 IMPLANT
UNDERPAD 30X30 INCONTINENT (UNDERPADS AND DIAPERS) ×6 IMPLANT
WATER STERILE IRR 3000ML UROMA (IV SOLUTION) ×1 IMPLANT
WATER STERILE IRR 500ML POUR (IV SOLUTION) ×3 IMPLANT
nucletron selectseed ×1 IMPLANT

## 2011-07-21 NOTE — Anesthesia Postprocedure Evaluation (Signed)
  Anesthesia Post-op Note  Patient: Dale Taylor  Procedure(s) Performed: Procedure(s) (LRB): RADIOACTIVE SEED IMPLANT (N/A) CYSTOSCOPY (N/A)  Patient Location: PACU  Anesthesia Type: General  Level of Consciousness: awake and alert   Airway and Oxygen Therapy: Patient Spontanous Breathing  Post-op Pain: mild  Post-op Assessment: Post-op Vital signs reviewed, Patient's Cardiovascular Status Stable, Respiratory Function Stable, Patent Airway and No signs of Nausea or vomiting  Post-op Vital Signs: stable  Complications: No apparent anesthesia complications

## 2011-07-21 NOTE — Op Note (Signed)
Op note dictated:  MRN: [id number], CSN: [id number]  July 21, 2011  Dictation #: [id number]

## 2011-07-21 NOTE — Anesthesia Procedure Notes (Signed)
Procedure Name: LMA Insertion Date/Time: 07/21/2011 2:37 PM Performed by: Norva Pavlov Pre-anesthesia Checklist: Patient identified, Emergency Drugs available, Suction available and Patient being monitored Patient Re-evaluated:Patient Re-evaluated prior to inductionOxygen Delivery Method: Circle System Utilized Preoxygenation: Pre-oxygenation with 100% oxygen Intubation Type: IV induction Ventilation: Mask ventilation without difficulty LMA: LMA inserted LMA Size: 4.0 Number of attempts: 1 Airway Equipment and Method: bite block Placement Confirmation: positive ETCO2 Tube secured with: Tape Dental Injury: Teeth and Oropharynx as per pre-operative assessment

## 2011-07-21 NOTE — Transfer of Care (Signed)
Immediate Anesthesia Transfer of Care Note  Patient: Dale Taylor  Procedure(s) Performed: Procedure(s) (LRB): RADIOACTIVE SEED IMPLANT (N/A) CYSTOSCOPY (N/A)  Patient Location: PACU  Anesthesia Type: General  Level of Consciousness: awake, sedated, patient cooperative and responds to stimulation  Airway & Oxygen Therapy: Patient Spontanous Breathing and Patient connected to face mask oxygen  Post-op Assessment: Report given to PACU RN, Post -op Vital signs reviewed and stable and Patient moving all extremities  Post vital signs: Reviewed and stable  Complications: No apparent anesthesia complications

## 2011-07-21 NOTE — Progress Notes (Signed)
End of treatment summary:  Diagnosis: Stage TI C. favorable risk adenocarcinoma prostate  Site/dose: Prostate 14,500 cGy  Isotope: I-125 utilizing 72 seeds and 22 active needles  Seed activity 0.508 mCi per seed for a total implant activity of 36.58 mCi.  Narrative: The patient appears to undergone a successful Nucletron seed Selectron implant with Dr. Saddie Benders.  Plan: The patient will visit Dr. Saddie Benders tomorrow morning at his office and return here for a followup visit in 3 weeks. We will obtain a CT scan at that time for his post implant dosimetry to assess the quality of his implant.

## 2011-07-21 NOTE — Progress Notes (Signed)
Complex simulation note:  Mr. Danowski underwent complex simulation on April 30 for his CT arch study. He was taken to the CT simulator. His pelvis was scanned. I contoured his prostate and he was found to have a prostate volume of approximately 45 cc with a prosthetic length approaching 5.0 cm. The arch was open. Of note is that he underwent treatment planning on February 14 and again on April 30 in preparation of his prostate seed implant. I prescribed 14,500 cGy utilizing I-125 seeds. I requested that he be implanted with Nucletron seed Selectron system. His implant was delayed because of the need for cardiac clearance.

## 2011-07-21 NOTE — H&P (Signed)
NAMECLINT, BIELLO                 ACCOUNT NO.:  [id number]  MEDICAL RECORD NO.:  [id number]  LOCATION:                               FACILITY:  [facility]  PHYSICIAN:  Debroah Baller, M.D.     DATE OF BIRTH:  [birth year]  DATE OF ADMISSION: DATE OF DISCHARGE:                             HISTORY & PHYSICAL   HISTORY OF PRESENT ILLNESS:  Mr. Joung is a 71 year old male with a history of prostate cancer.  He has been undergoing downstaging and downsizing of his prostate over the last few months and he has been prepared now for radioactive seed implantation.  Initially, he was noted to have an elevated PSA of 8.4 and underwent transrectal ultrasound and biopsy of the prostate in October 2012.  He was found to have Gleason 6 (3+ 3) adenocarcinoma involving 10% of 1 core and 29% of the second core in the left base.  The staging of the cancer showed no evidence of metastasis.  His prostate was enlarged at that time, so he has undergone several months of hormonal downstaging and he is ready now for seed implantation.  PAST MEDICAL HISTORY:  Significant for hypertension, elevated cholesterol, diabetes, arthritis, history of angina, and he has been cleared by his cardiologist.  PAST SURGICAL HISTORY:  Includes lung lobectomy in the past.  FAMILY HISTORY:  Noncontributory.  SOCIAL HISTORY:  He does not smoke.  He stopped 32 years ago.  ALLERGIES:  He has no known drug allergies.  MEDICATIONS: 1. Zyloprim 200 mg daily. 2. Daily aspirin 81 mg which he has stopped. 3. Cardura 8 mg daily. 4. Lasix 20 mg b.i.d. 5. Amaryl 4 mg q.a.m. 6. Labetalol 100 mg tablet daily. 7. Prilosec 40 mg capsule daily. 8. Zocor 40 mg tablet daily. 9. Tamsulosin 0.4 mg daily. 10.He also does take some vitamin supplements.  REVIEW OF SYMPTOMS:  Show no complaints of shortness of breath, chest pain.  No abdominal pain.  Bowel movements are regular.  PHYSICAL EXAMINATION:  GENERAL:  He is a well-developed,  well-nourished man in no acute distress.  HEENT:  The head is normocephalic.  The eyes show pupils are reactive to light.  NECK:  Without masses, palpable nodes, or bruits.  Neck is supple. BACK:  Without CVA tenderness. LUNGS:  Clear to auscultation. HEART:  Sounds showed regular rate and rhythm. ABDOMEN:  Obese and nontender. GENITALIA:  Normal.  Penis is normal.  Both testes descended. RECTAL:  Shows an approximately 50 g prostate without nodularity at this time.  EXTREMITIES:  Without edema. NEUROLOGIC:  Nonfocal.  PERTINENT LABS:  Pending at this time.  His initial PSA was 8.4.  Recent PSA is less than 0.1.  IMPRESSION:  Stage T1c adenocarcinoma of the prostate.  The patient has been counseled extensively to his therapies.  PLAN:  We will proceed with radioactive seed implantation for definitive therapy of his prostate cancer.  His questions have been answered, and he is willing and ready to proceed.          ______________________________ Debroah Baller, M.D.     RC/MEDQ  D:  07/20/2011  T:  07/20/2011  Job:  [id number]

## 2011-07-21 NOTE — Progress Notes (Signed)
[facility] Radiation Oncology Brachytherapy Operative Procedure Note  Name: Darald Uzzle MRN: [id number]  Date:   06/03/2011           DOB: 1940/10/07  Status:outpatient    CC: Dr. Debroah Baller, Dr. Donnel Saxon DIAGNOSIS: A 71 year old gentlemen with stage T1 C. adenocarcinoma of the prostate with a Gleason of 6 and a PSA of 8.4.  PROCEDURE: Insertion of radioactive I-125 seeds into the prostate gland.  RADIATION DOSE: 145 Gy, definitive therapy.  TECHNIQUE: Dionisios Ricci was brought to the operating room with Dr. Saddie Benders. He was placed in the dorsolithotomy position. He was catheterized and a rectal tube was inserted. The perineum was shaved, prepped and draped. The ultrasound probe was then introduced into the rectum to see the prostate gland.  TREATMENT DEVICE: A needle grid was attached to the ultrasound probe stand and anchor needles were placed.  COMPLEX ISODOSE CALCULATION: The prostate was imaged in 3D using a sagittal sweep of the prostate probe. These images were transferred to the planning computer. There, the prostate, urethra and rectum were defined on each axial reconstructed image. Then, the software created an optimized plan and a few seed positions were adjusted. Then the accepted plan was uploaded to the seed Selectron afterloading unit.  SPECIAL TREATMENT PROCEDURE/SUPERVISION AND HANDLING: The Nucletron FIRST system was used to place the needles under sagittal guidance. A total of 22 needles were used to deposit 72 seeds in the prostate gland. The individual seed activity was 0.51 mCi for a total implant activity of 36.6 mCi.  COMPLEX SIMULATION: At the end of the procedure, an anterior radiograph of the pelvis was obtained to document seed positioning and count. Cystoscopy was performed to check the urethra and bladder.  MICRODOSIMETRY: At the end of the procedure, the patient was emitting 0.53 mrem/hr at 1 meter. Accordingly, he was considered safe for hospital  discharge.  PLAN: The patient will return to the radiation oncology clinic for post implant CT dosimetry in three weeks.

## 2011-07-21 NOTE — Anesthesia Preprocedure Evaluation (Addendum)
Anesthesia Evaluation  Patient identified by MRN, date of birth, ID band Patient awake  General Assessment Comment:Gout- left wrist painful today  Reviewed: Allergy & Precautions, H&P , NPO status , Patient's Chart, lab work & pertinent test results, reviewed documented beta blocker date and time   Airway Mallampati: II TM Distance: >3 FB Neck ROM: Full    Dental  (+) Upper Dentures and Lower Dentures   Pulmonary shortness of breath, former smoker,  CXR report from 12/05/10 reviewed. breath sounds clear to auscultation        Cardiovascular hypertension, Pt. on medications + Peripheral Vascular Disease negative cardio ROS  Rhythm:Regular Rate:Normal  Given CV clearance Denies cardiac symptoms Nl cardiolite scan May 2013   Neuro/Psych negative neurological ROS  negative psych ROS   GI/Hepatic Neg liver ROS, GERD-  Medicated and Controlled,  Endo/Other  Type 2, Oral Hypoglycemic AgentsGout  Renal/GU Mildly elevated Cr 1.6 potassium 5.5   Prostate cancer    Musculoskeletal negative musculoskeletal ROS (+)   Abdominal   Peds negative pediatric ROS (+)  Hematology Anemia, Hgb 10.5 Hx leukemia    Anesthesia Other Findings   Reproductive/Obstetrics negative OB ROS                         Anesthesia Physical Anesthesia Plan  ASA: III  Anesthesia Plan: General   Post-op Pain Management:    Induction: Intravenous  Airway Management Planned: LMA  Additional Equipment:   Intra-op Plan:   Post-operative Plan: Extubation in OR  Informed Consent: I have reviewed the patients History and Physical, chart, labs and discussed the procedure including the risks, benefits and alternatives for the proposed anesthesia with the patient or authorized representative who has indicated his/her understanding and acceptance.     Plan Discussed with: CRNA and Surgeon  Anesthesia Plan Comments:          Anesthesia Quick Evaluation

## 2011-07-22 ENCOUNTER — Encounter (HOSPITAL_BASED_OUTPATIENT_CLINIC_OR_DEPARTMENT_OTHER): Payer: Self-pay | Admitting: Urology

## 2011-07-22 NOTE — Cardiovascular Report (Signed)
Dale Taylor, Dale Taylor                 ACCOUNT NO.:  [id number]  MEDICAL RECORD NO.:  [id number]  LOCATION:                               FACILITY:  [facility]  PHYSICIAN:  Debroah Baller, M.D.     DATE OF BIRTH:  29-Nov-1940  DATE OF PROCEDURE:  07/21/2011 DATE OF DISCHARGE:                           CARDIAC CATHETERIZATION   PREOPERATIVE DIAGNOSIS:  Stage T1c prostate cancer.  POSTOP DIAGNOSIS:  Stage T1c prostate cancer.  PROCEDURE:  Transrectal ultrasound, injured prostatic seed implantation of I125 seeds, and cystoscopy.  SURGEON:  Dr. Debroah Baller and Dr. Maryln Gottron.  ANESTHESIA:  General.  INDICATION FOR PROCEDURE:  The patient is a 71 year old, black gentleman with a diagnosis of adenocarcinoma of the prostate gland Gleason 6 in the left base.  He has undergone hormonal down staging and is now brought in for definitive therapy of his prostate cancer with seed implantation.  PROCEDURE:  The patient was brought into the operating room, placed on the table in the supine position.  After adequate general anesthesia was achieved, Foley catheter was placed, and then the patient was carefully placed in exaggerated lithotomy with Yellofin stirrups.  The scrotum was then elevated and taped onto the lower abdomen.  The perineum was then shaved, prepped, and draped for the performance of the procedure.  The red rubber catheter was inserted into the rectum to relieve excess air and then the transrectal ultrasound probe was placed.  The prostate was scanned from apex to seminal vesicles and good images could be seen. Prostatic anchors were then placed.  Then prostate was again scanned from apex to seminal vesicles.  The images were captured then into the new Clinitron program.  Each individual images were then reviewed by myself and Dr. [name] and the prostatic borders were marked as was the urethra and rectum.  The computer program was then used to calculate appropriate seed  distribution.  All the individual images were then reviewed by myself physician assistant, Dr. [name], to ascertain seed placement.  Once we had good coverage of the prostate with sparing of the urethra and rectum, seed implantation was begun and the bladder neck was identified with the Foley catheter balloon in place with contrast. Needles were then placed from anterior to posterior on the prostate to encompass the prostate in its entirety, with no portion being left untreated.  Once the final needles were placed, the anchor, needles were removed and pelvic x-ray was obtained showing good distribution of the seeds.  Foley catheter was then removed.  The patient was then re- prepped and draped and flexible cystoscopy was performed which showed no seeds within the bladder.  Rectal exam was performed.  This did not reveal any foreign bodies in the rectum.  Under sterile conditions, a 16- [place] Foley was placed to drain the bladder.  Prior to the procedure, a B and O suppository was placed for local analgesia.  The patient had tolerated all the procedure well.  He was then awakened and taken to the recovery room in good condition.          ______________________________ Debroah Baller, M.D.     RC/MEDQ  D:  07/21/2011  T:  07/22/2011  Job:  [id number]

## 2011-08-06 ENCOUNTER — Encounter: Payer: Self-pay | Admitting: Radiation Oncology

## 2011-08-09 ENCOUNTER — Telehealth: Payer: Self-pay | Admitting: *Deleted

## 2011-08-09 NOTE — Telephone Encounter (Signed)
CALLED PATIENT TO REMIND OF APPTS. FOR 08-10-11, LVM FOR A RETURN CALL

## 2011-08-10 ENCOUNTER — Ambulatory Visit
Admission: RE | Admit: 2011-08-10 | Discharge: 2011-08-10 | Disposition: A | Discharge status: Home / Self Care | Payer: Medicare Other | Source: Ambulatory Visit | Attending: Radiation Oncology | Admitting: Radiation Oncology

## 2011-08-10 VITALS — BP 171/66 | HR 73 | Temp 98.1°F | Wt 225.7 lb

## 2011-08-10 DIAGNOSIS — Z79899 Other long term (current) drug therapy: Secondary | ICD-10-CM | POA: Insufficient documentation

## 2011-08-10 DIAGNOSIS — E119 Type 2 diabetes mellitus without complications: Secondary | ICD-10-CM | POA: Insufficient documentation

## 2011-08-10 DIAGNOSIS — E7800 Pure hypercholesterolemia, unspecified: Secondary | ICD-10-CM | POA: Insufficient documentation

## 2011-08-10 DIAGNOSIS — C61 Malignant neoplasm of prostate: Secondary | ICD-10-CM

## 2011-08-10 DIAGNOSIS — I10 Essential (primary) hypertension: Secondary | ICD-10-CM | POA: Insufficient documentation

## 2011-08-10 NOTE — Progress Notes (Signed)
Complex simulation note: The patient was taken to the CT simulator. His pelvis was scanned. He appears to have a satisfactory seed distribution. It is noted that he does have a median bar. The CT data set was sent to the [facility] seed Selectron planning system for contouring of his prostate and rectum. He'll then undergo 3-D simulation to assess the quality of his implant.

## 2011-08-10 NOTE — Progress Notes (Signed)
Followup note: Mr. Bologna returns today approximately 3 weeks following his prostate seed implant with Dr. Albin Felling in the management of his stage TI C. favorable risk adenocarcinoma prostate. Since his implant he has had worsening nocturia, urinating to 3 times an hour. His family feels that his urinary frequency may be worsened by taking his "fluid pill" after 8:00 in the evening. His urination is somewhat slow. Dysuria is minimal. He denies fever or chills. He will back to see Dr. Albin Felling on August 5. He is on Flomax once or twice a day. He does have occasional constipation.  Physical examination: He's not examined today. 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 He tells me he takes his diuretic when necessary he may want to hold off on taking his diuretic after 4:00 in the evening. Of note is that his CT scan for his post implant dosimetry shows what appears to be an excellent distribution with no seeds in the rectum. There is excellent coverage of the base, and it is noted that he does have a median bar.  Plan: The patient will keep his followup visit with Dr. Saddie Benders on August 5. If he does not have a post void residual and he may benefit from an antispasmodic agent in addition of Flomax. I certainly defer to Dr. Saddie Benders. I ask that Saddie Benders keep  posted on his progress. We will move ahead with his post implant dosimetry and for the results of Dr. Saddie Benders.

## 2011-08-10 NOTE — Progress Notes (Signed)
Here for routien follow up post radioactive seed implant on 07/21/11. Patient having frequency and urgency and nocturia as much as every 15 minutes.Currntly takes flomax bid. Has cramping pain on urination most everytime he urinates but no burning.Denies visible blood un urine.

## 2011-08-24 ENCOUNTER — Telehealth: Payer: Self-pay | Admitting: Radiation Oncology

## 2011-08-24 NOTE — Telephone Encounter (Signed)
Faxed FUP 02/25/11, EOT 07/21/11, FUP 08/10/11, path 10/21/10 and 10/27/10 to [facility], Fax [phone].  OK per SES in RJM's absence.

## 2011-12-03 ENCOUNTER — Ambulatory Visit
Admission: RE | Admit: 2011-12-03 | Discharge: 2011-12-03 | Disposition: A | Discharge status: Home / Self Care | Payer: Medicare Other | Source: Ambulatory Visit | Attending: Radiation Oncology | Admitting: Radiation Oncology

## 2011-12-03 DIAGNOSIS — C61 Malignant neoplasm of prostate: Secondary | ICD-10-CM | POA: Insufficient documentation

## 2011-12-08 ENCOUNTER — Encounter: Payer: Self-pay | Admitting: Radiation Oncology

## 2011-12-08 NOTE — Progress Notes (Signed)
Post-prostate seed implant dosimetry/3-D simulation note: The patient underwent post-prostate seed implant dosimetry/3-D simulation to assess the quality of his implant. His post implant CT simulation was on July 30, but the data set was not sent for contouring until this week, and thus the delay for reporting his post implant dosimetry. His intraoperative prostate volume by ultrasound was 45.7 cc while his postoperative prostate volume by CT was 49.7 cc, a good correlation. Dose volume histograms were obtained for the prostate and rectum. His prostate D 90 is 106.0% and V100 93.7%, both excellent. Only 0.1 cc of rectum received the prescribed dose of 14,500 cGy. In summary, Dale Taylor has excellent post implant dosimetry with a low-risk for late rectal toxicity.  CC: Dr. Debroah Baller

## 2011-12-20 ENCOUNTER — Encounter: Payer: Self-pay | Admitting: *Deleted

## 2012-06-06 DIAGNOSIS — I214 Non-ST elevation (NSTEMI) myocardial infarction: Secondary | ICD-10-CM | POA: Diagnosis present

## 2012-06-06 DIAGNOSIS — S72142A Displaced intertrochanteric fracture of left femur, initial encounter for closed fracture: Secondary | ICD-10-CM

## 2012-06-06 DIAGNOSIS — R339 Retention of urine, unspecified: Secondary | ICD-10-CM | POA: Insufficient documentation

## 2012-06-30 ENCOUNTER — Inpatient Hospital Stay (HOSPITAL_COMMUNITY): Payer: Medicare Other

## 2012-06-30 ENCOUNTER — Encounter (HOSPITAL_COMMUNITY): Payer: Self-pay | Admitting: Nephrology

## 2012-06-30 ENCOUNTER — Inpatient Hospital Stay (HOSPITAL_COMMUNITY)
Admission: AD | Admit: 2012-06-30 | Discharge: 2012-07-10 | DRG: 682 | Disposition: A | Discharge status: Skilled Nursing Facility | Payer: Medicare Other | Source: Other Acute Inpatient Hospital | Attending: Internal Medicine | Admitting: Internal Medicine

## 2012-06-30 DIAGNOSIS — R0602 Shortness of breath: Secondary | ICD-10-CM

## 2012-06-30 DIAGNOSIS — I252 Old myocardial infarction: Secondary | ICD-10-CM

## 2012-06-30 DIAGNOSIS — R339 Retention of urine, unspecified: Secondary | ICD-10-CM

## 2012-06-30 DIAGNOSIS — G929 Unspecified toxic encephalopathy: Secondary | ICD-10-CM | POA: Diagnosis present

## 2012-06-30 DIAGNOSIS — Z8546 Personal history of malignant neoplasm of prostate: Secondary | ICD-10-CM

## 2012-06-30 DIAGNOSIS — I129 Hypertensive chronic kidney disease with stage 1 through stage 4 chronic kidney disease, or unspecified chronic kidney disease: Secondary | ICD-10-CM | POA: Diagnosis present

## 2012-06-30 DIAGNOSIS — E875 Hyperkalemia: Secondary | ICD-10-CM | POA: Diagnosis present

## 2012-06-30 DIAGNOSIS — M109 Gout, unspecified: Secondary | ICD-10-CM | POA: Diagnosis present

## 2012-06-30 DIAGNOSIS — I4891 Unspecified atrial fibrillation: Secondary | ICD-10-CM | POA: Diagnosis not present

## 2012-06-30 DIAGNOSIS — C61 Malignant neoplasm of prostate: Secondary | ICD-10-CM | POA: Diagnosis present

## 2012-06-30 DIAGNOSIS — Z794 Long term (current) use of insulin: Secondary | ICD-10-CM

## 2012-06-30 DIAGNOSIS — E872 Acidosis, unspecified: Secondary | ICD-10-CM | POA: Diagnosis present

## 2012-06-30 DIAGNOSIS — D649 Anemia, unspecified: Secondary | ICD-10-CM

## 2012-06-30 DIAGNOSIS — S72142E Displaced intertrochanteric fracture of left femur, subsequent encounter for open fracture type I or II with routine healing: Secondary | ICD-10-CM

## 2012-06-30 DIAGNOSIS — C9210 Chronic myeloid leukemia, BCR/ABL-positive, not having achieved remission: Secondary | ICD-10-CM | POA: Diagnosis present

## 2012-06-30 DIAGNOSIS — N179 Acute kidney failure, unspecified: Principal | ICD-10-CM | POA: Diagnosis present

## 2012-06-30 DIAGNOSIS — D509 Iron deficiency anemia, unspecified: Secondary | ICD-10-CM | POA: Diagnosis present

## 2012-06-30 DIAGNOSIS — E1169 Type 2 diabetes mellitus with other specified complication: Secondary | ICD-10-CM | POA: Diagnosis present

## 2012-06-30 DIAGNOSIS — I5032 Chronic diastolic (congestive) heart failure: Secondary | ICD-10-CM | POA: Diagnosis present

## 2012-06-30 DIAGNOSIS — E119 Type 2 diabetes mellitus without complications: Secondary | ICD-10-CM | POA: Diagnosis present

## 2012-06-30 DIAGNOSIS — E162 Hypoglycemia, unspecified: Secondary | ICD-10-CM | POA: Diagnosis present

## 2012-06-30 DIAGNOSIS — R079 Chest pain, unspecified: Secondary | ICD-10-CM

## 2012-06-30 DIAGNOSIS — S72142A Displaced intertrochanteric fracture of left femur, initial encounter for closed fracture: Secondary | ICD-10-CM

## 2012-06-30 DIAGNOSIS — N184 Chronic kidney disease, stage 4 (severe): Secondary | ICD-10-CM

## 2012-06-30 DIAGNOSIS — I739 Peripheral vascular disease, unspecified: Secondary | ICD-10-CM | POA: Diagnosis present

## 2012-06-30 DIAGNOSIS — R4182 Altered mental status, unspecified: Secondary | ICD-10-CM | POA: Diagnosis present

## 2012-06-30 DIAGNOSIS — N400 Enlarged prostate without lower urinary tract symptoms: Secondary | ICD-10-CM | POA: Diagnosis present

## 2012-06-30 DIAGNOSIS — E8809 Other disorders of plasma-protein metabolism, not elsewhere classified: Secondary | ICD-10-CM | POA: Diagnosis present

## 2012-06-30 DIAGNOSIS — Z7982 Long term (current) use of aspirin: Secondary | ICD-10-CM

## 2012-06-30 DIAGNOSIS — G92 Toxic encephalopathy: Secondary | ICD-10-CM | POA: Diagnosis present

## 2012-06-30 DIAGNOSIS — E7800 Pure hypercholesterolemia, unspecified: Secondary | ICD-10-CM | POA: Diagnosis present

## 2012-06-30 DIAGNOSIS — I214 Non-ST elevation (NSTEMI) myocardial infarction: Secondary | ICD-10-CM

## 2012-06-30 DIAGNOSIS — I10 Essential (primary) hypertension: Secondary | ICD-10-CM | POA: Diagnosis present

## 2012-06-30 DIAGNOSIS — Z79899 Other long term (current) drug therapy: Secondary | ICD-10-CM

## 2012-06-30 DIAGNOSIS — I509 Heart failure, unspecified: Secondary | ICD-10-CM | POA: Diagnosis present

## 2012-06-30 DIAGNOSIS — E440 Moderate protein-calorie malnutrition: Secondary | ICD-10-CM | POA: Diagnosis present

## 2012-06-30 DIAGNOSIS — Z7902 Long term (current) use of antithrombotics/antiplatelets: Secondary | ICD-10-CM

## 2012-06-30 DIAGNOSIS — K219 Gastro-esophageal reflux disease without esophagitis: Secondary | ICD-10-CM | POA: Diagnosis present

## 2012-06-30 DIAGNOSIS — T383X5A Adverse effect of insulin and oral hypoglycemic [antidiabetic] drugs, initial encounter: Secondary | ICD-10-CM | POA: Diagnosis present

## 2012-06-30 DIAGNOSIS — D631 Anemia in chronic kidney disease: Secondary | ICD-10-CM | POA: Diagnosis present

## 2012-06-30 DIAGNOSIS — N183 Chronic kidney disease, stage 3 unspecified: Secondary | ICD-10-CM | POA: Diagnosis present

## 2012-06-30 DIAGNOSIS — Z87891 Personal history of nicotine dependence: Secondary | ICD-10-CM

## 2012-06-30 HISTORY — DX: Acute kidney failure, unspecified: N17.9

## 2012-06-30 LAB — CBC
HCT: 20.4 % — ABNORMAL LOW (ref 39.0–52.0)
Hemoglobin: 7.5 g/dL — ABNORMAL LOW (ref 13.0–17.0)
MCH: 29.9 pg (ref 26.0–34.0)
MCH: 29.7 pg (ref 26.0–34.0)
MCHC: 34.8 g/dL (ref 30.0–36.0)
MCV: 85.3 fL (ref 78.0–100.0)
MCV: 85.4 fL (ref 78.0–100.0)
Platelets: 369 10*3/uL (ref 150–400)
RBC: 2.51 MIL/uL — ABNORMAL LOW (ref 4.22–5.81)
RDW: 14.9 % (ref 11.5–15.5)
WBC: 11.4 10*3/uL — ABNORMAL HIGH (ref 4.0–10.5)

## 2012-06-30 LAB — ABO/RH: ABO/RH(D): O POS

## 2012-06-30 LAB — COMPREHENSIVE METABOLIC PANEL
ALT: 19 U/L (ref 0–53)
CO2: 21 mEq/L (ref 19–32)
Calcium: 8.2 mg/dL — ABNORMAL LOW (ref 8.4–10.5)
Creatinine, Ser: 6.97 mg/dL — ABNORMAL HIGH (ref 0.50–1.35)
GFR calc Af Amer: 8 mL/min — ABNORMAL LOW (ref >90)
GFR calc non Af Amer: 7 mL/min — ABNORMAL LOW (ref >90)
Glucose, Bld: 102 mg/dL — ABNORMAL HIGH (ref 70–99)

## 2012-06-30 LAB — RETICULOCYTES
RBC.: 2.39 MIL/uL — ABNORMAL LOW (ref 4.22–5.81)
Retic Count, Absolute: 23.9 10*3/uL (ref 19.0–186.0)
Retic Ct Pct: 1 % (ref 0.4–3.1)

## 2012-06-30 LAB — URINALYSIS, ROUTINE W REFLEX MICROSCOPIC
Nitrite: NEGATIVE
Protein, ur: NEGATIVE mg/dL
Urobilinogen, UA: 1 mg/dL (ref 0.0–1.0)

## 2012-06-30 LAB — GLUCOSE, CAPILLARY: Glucose-Capillary: 98 mg/dL (ref 70–99)

## 2012-06-30 LAB — FERRITIN: Ferritin: 831 ng/mL — ABNORMAL HIGH (ref 22–322)

## 2012-06-30 LAB — VITAMIN B12: Vitamin B-12: 913 pg/mL — ABNORMAL HIGH (ref 211–911)

## 2012-06-30 LAB — URINE MICROSCOPIC-ADD ON

## 2012-06-30 MED ORDER — INSULIN ASPART 100 UNIT/ML IV SOLN
6.0000 [IU] | Freq: Once | INTRAVENOUS | Status: AC
  Administered 2012-06-30: 6 [IU] via INTRAVENOUS
  Filled 2012-06-30: qty 0.06

## 2012-06-30 MED ORDER — FERROUS SULFATE 325 (65 FE) MG PO TABS
325.0000 mg | ORAL_TABLET | Freq: Every day | ORAL | Status: DC
  Administered 2012-07-01 – 2012-07-07 (×7): 325 mg via ORAL
  Filled 2012-06-30 (×10): qty 1

## 2012-06-30 MED ORDER — HEPARIN SODIUM (PORCINE) 5000 UNIT/ML IJ SOLN
5000.0000 [IU] | Freq: Three times a day (TID) | INTRAMUSCULAR | Status: DC
  Filled 2012-06-30 (×3): qty 1

## 2012-06-30 MED ORDER — OXYBUTYNIN CHLORIDE ER 5 MG PO TB24
5.0000 mg | ORAL_TABLET | Freq: Every day | ORAL | Status: DC
  Administered 2012-06-30 – 2012-07-09 (×10): 5 mg via ORAL
  Filled 2012-06-30 (×11): qty 1

## 2012-06-30 MED ORDER — TAMSULOSIN HCL 0.4 MG PO CAPS
0.4000 mg | ORAL_CAPSULE | Freq: Two times a day (BID) | ORAL | Status: DC
  Administered 2012-06-30 – 2012-07-10 (×20): 0.4 mg via ORAL
  Filled 2012-06-30 (×21): qty 1

## 2012-06-30 MED ORDER — CLOPIDOGREL BISULFATE 75 MG PO TABS
75.0000 mg | ORAL_TABLET | Freq: Every day | ORAL | Status: DC
  Administered 2012-06-30 – 2012-07-10 (×11): 75 mg via ORAL
  Filled 2012-06-30 (×11): qty 1

## 2012-06-30 MED ORDER — SODIUM CHLORIDE 0.9 % IJ SOLN
3.0000 mL | Freq: Two times a day (BID) | INTRAMUSCULAR | Status: DC
  Administered 2012-06-30 – 2012-07-10 (×12): 3 mL via INTRAVENOUS

## 2012-06-30 MED ORDER — SIMVASTATIN 40 MG PO TABS
40.0000 mg | ORAL_TABLET | Freq: Every day | ORAL | Status: DC
  Administered 2012-06-30 – 2012-07-09 (×10): 40 mg via ORAL
  Filled 2012-06-30 (×11): qty 1

## 2012-06-30 MED ORDER — LUBIPROSTONE 24 MCG PO CAPS
24.0000 ug | ORAL_CAPSULE | Freq: Two times a day (BID) | ORAL | Status: DC
  Administered 2012-07-01 – 2012-07-10 (×20): 24 ug via ORAL
  Filled 2012-06-30 (×23): qty 1

## 2012-06-30 MED ORDER — ALUM & MAG HYDROXIDE-SIMETH 200-200-20 MG/5ML PO SUSP: 30.0000 mL | Freq: Four times a day (QID) | ORAL | Status: DC | PRN

## 2012-06-30 MED ORDER — ALBUTEROL SULFATE (5 MG/ML) 0.5% IN NEBU
2.5000 mg | INHALATION_SOLUTION | Freq: Three times a day (TID) | RESPIRATORY_TRACT | Status: DC
  Administered 2012-06-30 – 2012-07-02 (×4): 2.5 mg via RESPIRATORY_TRACT
  Filled 2012-06-30 (×4): qty 0.5

## 2012-06-30 MED ORDER — SODIUM BICARBONATE 8.4 % IV SOLN
INTRAVENOUS | Status: DC
  Administered 2012-06-30 – 2012-07-01 (×2): via INTRAVENOUS
  Filled 2012-06-30 (×5): qty 150

## 2012-06-30 MED ORDER — SODIUM POLYSTYRENE SULFONATE 15 GM/60ML PO SUSP
45.0000 g | Freq: Once | ORAL | Status: AC
  Administered 2012-06-30: 45 g via ORAL
  Filled 2012-06-30: qty 180

## 2012-06-30 MED ORDER — FINASTERIDE 5 MG PO TABS
5.0000 mg | ORAL_TABLET | Freq: Every day | ORAL | Status: DC
  Administered 2012-06-30 – 2012-07-10 (×11): 5 mg via ORAL
  Filled 2012-06-30 (×11): qty 1

## 2012-06-30 MED ORDER — IPRATROPIUM BROMIDE 0.02 % IN SOLN
0.5000 mg | Freq: Three times a day (TID) | RESPIRATORY_TRACT | Status: DC
  Administered 2012-06-30 – 2012-07-02 (×4): 0.5 mg via RESPIRATORY_TRACT
  Filled 2012-06-30 (×4): qty 2.5

## 2012-06-30 MED ORDER — ALBUTEROL SULFATE (5 MG/ML) 0.5% IN NEBU: 2.5000 mg | INHALATION_SOLUTION | RESPIRATORY_TRACT | Status: DC | PRN

## 2012-06-30 MED ORDER — DEXTROSE-NACL 5-0.9 % IV SOLN
INTRAVENOUS | Status: DC
  Administered 2012-06-30: 18:00:00 via INTRAVENOUS

## 2012-06-30 MED ORDER — DEXTROSE 50 % IV SOLN
1.0000 | Freq: Once | INTRAVENOUS | Status: AC
  Administered 2012-06-30: 50 mL via INTRAVENOUS
  Filled 2012-06-30: qty 50

## 2012-06-30 MED ORDER — ACETAMINOPHEN 650 MG RE SUPP: 650.0000 mg | Freq: Four times a day (QID) | RECTAL | Status: DC | PRN

## 2012-06-30 MED ORDER — ACETAMINOPHEN 325 MG PO TABS
650.0000 mg | ORAL_TABLET | Freq: Four times a day (QID) | ORAL | Status: DC | PRN
  Administered 2012-07-01 – 2012-07-09 (×8): 650 mg via ORAL
  Filled 2012-06-30 (×8): qty 2

## 2012-06-30 MED ORDER — IPRATROPIUM-ALBUTEROL 0.5-2.5 (3) MG/3ML IN SOLN: 3.0000 mL | Freq: Three times a day (TID) | RESPIRATORY_TRACT | Status: DC

## 2012-06-30 MED ORDER — INSULIN ASPART 100 UNIT/ML IV SOLN
10.0000 [IU] | Freq: Once | INTRAVENOUS | Status: DC
  Filled 2012-06-30: qty 0.1

## 2012-06-30 MED ORDER — VITAMIN E 180 MG (400 UNIT) PO CAPS
400.0000 [IU] | ORAL_CAPSULE | Freq: Two times a day (BID) | ORAL | Status: DC
  Administered 2012-06-30 – 2012-07-10 (×20): 400 [IU] via ORAL
  Filled 2012-06-30 (×21): qty 1

## 2012-06-30 MED ORDER — ONDANSETRON HCL 4 MG/2ML IJ SOLN: 4.0000 mg | Freq: Four times a day (QID) | INTRAMUSCULAR | Status: DC | PRN

## 2012-06-30 MED ORDER — ONDANSETRON HCL 4 MG PO TABS
4.0000 mg | ORAL_TABLET | Freq: Four times a day (QID) | ORAL | Status: DC | PRN
  Administered 2012-07-01: 4 mg via ORAL
  Filled 2012-06-30: qty 1

## 2012-06-30 MED ORDER — ASPIRIN 81 MG PO CHEW
81.0000 mg | CHEWABLE_TABLET | ORAL | Status: DC
  Administered 2012-06-30 – 2012-07-10 (×6): 81 mg via ORAL
  Filled 2012-06-30 (×6): qty 1

## 2012-06-30 MED ORDER — SODIUM CHLORIDE 0.9 % IJ SOLN
INTRAMUSCULAR | Status: AC
  Administered 2012-06-30: 10 mL
  Filled 2012-06-30: qty 30

## 2012-06-30 NOTE — Consult Note (Addendum)
Requesting Physician:  Dr. Jerral Ralph Reason for Consult:  Acute renal failure and hyperkalemia HPI: The patient is a 72 y.o. year-old AAM transferred from the [facility] ED to [facility] because of acute renal failure. He has a background history of HTN, dyslipidemia, diastolic heart failure, prostate cancer,  urinary retention, gout, PAD, CML with recurrent symptomatic anemia, and baseline CKD3.  Patient's baseline creatinine is around 1.2  He was hospitalized at [facility] 5/27-06/16/12 with a hip fracture. At the time of presentation he had a creatinine of 4.3 and a potassium of 7.8.  He was on lisinopril and spironolactone at that time.  His potassium responded to conservative management, and his renal function returned to baseline with a creatinine 0n 06/16/12 of 1.10 and a potassium of 4.4  He was discharged to [facility] for rehab post hip fracture.  Says he has felt bad for a couple of weeks, no appetite.  Was taken to [facility] ED today because of abnormal labs done at the nursing home the day before (done because of altered mental status), with BUN 119, creatinine 6.3, potassium 6.5.  Labs today in the ED showed BUN 120, creatinine 7.4, potassium 6.4, CO2 of 20.  He was transferred to [facility] for further management.  Of note, his med list from the nursing facility showed that he was back on spironolactone and lisinopril.  He was treated in the ED there with kayexalate, glucose, insulin and bicarb and transferred here for further management.  Creatinine, Serum  Date/Time Value Range Status  06/30/12 7.4    06/29/12 6.3    06/16/12 1.2    06/05/12 4.3    05/11/2011  3:44 PM 1.53* 0.50 - 1.35 mg/dL Final  1/61/0960  4:54 AM 1.17   Final    Past Medical History:  Past Medical History  Diagnosis Date  . Hypertension   . Hypercholesterolemia   . GERD (gastroesophageal reflux disease)   . Dyspnea on exertion   . Claudication in peripheral vascular disease   . PVD (peripheral vascular  disease)   . S/P renal artery angioplasty 2009--- W/ STENT X1  TO LEFT COMMON ILIAC    . ED (erectile dysfunction)   . History of BPH   . Frequency of urination   . Urgency of urination   . Nocturia   . Chronic gouty arthritis FEET  . Diabetes mellitus     type II-- ORAL MED  . Prostate cancer SCHEDULED FOR RADIOACTIVE SEED IMPLANTS OF PROSTATE  05-18-2011    FOLLOWED BY DR [facility] AND DR [name]  . CML (chronic myelocytic leukemia) FOLLOWED BY DR LEWIS ([facility])    TAKES SPRYCEL PO MED FOR TX--  PT STATES STABLE  NO SYMPTOMS  . Iron deficiency anemia   . Chronic renal insufficiency   . PAD (peripheral artery disease)   . Cardiovascular disease with arteriosclerosis   . Blood transfusion LAST TIME NOV 2012  --  X2  UNITS  (FOR ANEMIA AND THROMBOCYTOPENIA  . Leukocytosis   . Peripheral edema   . History of thrombocytopenia   . Heart murmur   . CHF (congestive heart failure) ADMISSION NOV 2012  [facility])  REQUESTED RECORDS    DIATOSLIC CHF WITH LEFT VENTRICULAR IMPAIRED RELAXATION-----   CARDIOLOGIST----  DR Jaynie Bream  . Hypertension   . Arthritis   . History of angina     Past Surgical History:  Past Surgical History  Procedure Laterality Date  . Lung lobectomy  AGE 10 (APPROX.)  RIGHT LOWER LOBECTOMY 20 YEARS AGO, NOT CANCER (PROBLEM HEMORRHAGE)  . Hemorrhoid surgery    . Transthoracic echocardiogram  12-05-2010  [facility])    LEFT VENTRICULAR HYPERTROPHY / PRESERVED SYSTOLIC FUNCTION/ EF 60-65%/  IMPAIRED LEFT VENTRICULAR RELAXATION WITH LEFT ATRIAL ENLARGEMENT/ MILD MITRAL AND TRICUSPID REGURG.  . Cataract extraction w/ intraocular lens  implant, bilateral  2012  . Aortogram/ angioplasty and stenting of left common iliac artery  04-07-2007   DR FIELDS    LEFT COMMON ILIAC STENOSIS 80%, STENTED/ BILATERAL OCCLUSION ANTERIOR TIBIAL ARTERY/ BILATERAL INCOMPLETE PLANTAR ARCH FILLED BY PERONEAL AND POSTERIOR TIBIAL ARTERY/ DIFFUSE BUT PATENT  SUPERFICIIAL FEMORAL ARTERIES BILATERLLY  . Cardiovascular stress test  05-19-2011  DR ROBERT KRASOWSKI    NO EVIDENCE OF ISCHEMIA/ NORMAL LVSF/ EF 55%  . Radioactive seed implant  07/21/2011    Procedure: RADIOACTIVE SEED IMPLANT;  Surgeon: Debroah Baller, MD;  Location: [facility];  Service: Urology;  Laterality: N/A;    72   seeds implanted  . Cystoscopy  07/21/2011    Procedure: CYSTOSCOPY;  Surgeon: Debroah Baller, MD;  Location: [facility];  Service: Urology;  Laterality: N/A;  no seeds found in bladder   Left hip fracture surgical repair 06/08/12  Family History:  Family History  Problem Relation Age of Onset  . Cancer Maternal Aunt     UNKNOWN CANCER DIED AGE 17   Social History:  reports that he quit smoking about 33 years ago. His smoking use included Cigarettes. He has a 30 pack-year smoking history. He has never used smokeless tobacco. He reports that he does not drink alcohol or use illicit drugs.Has recently been living at [facility] for rehab following a left hip fracture 06/06/12.  Allergies: No Known Allergies  Home medications: Prior to Admission medications   Medication Sig Start Date End Date Taking? Authorizing Provider  allopurinol (ZYLOPRIM) 300 MG tablet Take 300 mg by mouth daily.   Yes Historical Provider, MD  amLODipine (NORVASC) 5 MG tablet Take 5 mg by mouth daily.   Yes Historical Provider, MD  aspirin 81 MG chewable tablet Chew 81 mg by mouth every other day.   Yes Historical Provider, MD  clopidogrel (PLAVIX) 75 MG tablet Take 75 mg by mouth daily.   Yes Historical Provider, MD  ferrous sulfate 325 (65 FE) MG tablet Take 325 mg by mouth daily with breakfast.   Yes Historical Provider, MD  finasteride (PROSCAR) 5 MG tablet Take 5 mg by mouth daily.   Yes Historical Provider, MD  furosemide (LASIX) 20 MG tablet Take 40-60 mg by mouth 2 (two) times daily. Take 60 mg in the morning and 40 mg in the evening   Yes Historical  Provider, MD  glimepiride (AMARYL) 4 MG tablet Take 4 mg by mouth daily before breakfast.    Yes Historical Provider, MD  hydrALAZINE (APRESOLINE) 10 MG tablet Take 10 mg by mouth 3 (three) times daily.   Yes Historical Provider, MD  ipratropium-albuterol (DUONEB) 0.5-2.5 (3) MG/3ML SOLN Take 3 mLs by nebulization every 6 (six) hours as needed (wheezing).   Yes Historical Provider, MD  isosorbide mononitrate (IMDUR) 30 MG 24 hr tablet Take 30 mg by mouth every morning.    Yes Historical Provider, MD  lisinopril (PRINIVIL,ZESTRIL) 5 MG tablet Take 5 mg by mouth daily.   Yes Historical Provider, MD  lubiprostone (AMITIZA) 24 MCG capsule Take 24 mcg by mouth 2 (two) times daily with a meal.   Yes  Historical Provider, MD  meclizine (ANTIVERT) 25 MG tablet Take 25 mg by mouth 3 (three) times daily as needed for dizziness.   Yes Historical Provider, MD  metoprolol (LOPRESSOR) 50 MG tablet Take 50 mg by mouth 2 (two) times daily.   Yes Historical Provider, MD  nilotinib (TASIGNA) 200 MG capsule Take 200 mg by mouth every 12 (twelve) hours. Give on an empty stomach 1 hour before or 2 hours after meals.   Yes Historical Provider, MD  omega-3 acid ethyl esters (LOVAZA) 1 G capsule Take 1 g by mouth 2 (two) times daily.   Yes Historical Provider, MD  oxybutynin (DITROPAN-XL) 5 MG 24 hr tablet Take 5 mg by mouth at bedtime.   Yes Historical Provider, MD  simvastatin (ZOCOR) 40 MG tablet Take 40 mg by mouth at bedtime.   Yes Historical Provider, MD  spironolactone (ALDACTONE) 25 MG tablet Take 25 mg by mouth daily.   Yes Historical Provider, MD  Tamsulosin HCl (FLOMAX) 0.4 MG CAPS Take 0.4 mg by mouth 2 (two) times daily.    Yes Historical Provider, MD  vitamin E 400 UNIT capsule Take 400 Units by mouth 2 (two) times daily.   Yes Historical Provider, MD    Inpatient medications: . ipratropium  0.5 mg Nebulization TID   And  . albuterol  2.5 mg Nebulization TID  . aspirin  81 mg Oral Q48H  . clopidogrel  75  mg Oral Daily  . [START ON 07/01/2012] ferrous sulfate  325 mg Oral Q breakfast  . finasteride  5 mg Oral Daily  . heparin  5,000 Units Subcutaneous Q8H  . [START ON 07/01/2012] lubiprostone  24 mcg Oral BID WC  . oxybutynin  5 mg Oral QHS  . simvastatin  40 mg Oral QHS  . sodium chloride  3 mL Intravenous Q12H  . tamsulosin  0.4 mg Oral BID  . vitamin E  400 Units Oral BID    Review of Systems Gen:  Denies headache, fever, chills, sweats. Generally weak since hip fracture  HEENT:  No visual change, sore throat, difficulty swallowing. Resp:  No difficulty breathing, DOE.  Cardiac:  No chest pain, orthopnea, PND.  Denies edema. GI:   Denies abdominal pain.   Endorses no appetite and not much intake GU:  Denies difficulty or change in voiding.  No change in urine color.     MS:  Denies joint pain or swelling.   Derm:  Denies skin rash or itching.  No chronic skin conditions.  Neuro:   Denies focal weakness, memory problems, hx stroke or TIA.   Psych:  Denies symptoms of depression of anxiety.  No hallucination.    Physical Exam:   Gen: Elderly BM NAD but having some myoclonic twitching Lines in left neck and right groin Skin: no rash, cyanosis Neck: no JVD, no bruits or LAN; left IJ line (in subclavian by CXR) Chest: Grossly clear anteriorly Heart: regular, no rub or gallop Abdomen: soft, no focal tenderness Ext: Chronic appearing 1+ edema LE's.  Healed scar left leg from repaired hip fracture Neuro: Awake, conversant, but slightly confused (can't remember what day it is, daughter says usually "pretty good") Heme/Lymph: no bruising or LAN   Basic Metabolic Panel:  Recent Labs  14/78/29 1705  NA 129*  K 6.4*  CL 95*  CO2 21  GLUCOSE 102*  BUN 114*  CREATININE 6.97*  CALCIUM 8.2*   Liver Function Tests:  Recent Labs  06/30/12 1705  AST 25  ALT 19  ALKPHOS 61  BILITOT 0.4  PROT 5.8*  ALBUMIN 2.6*   CBC:  Recent Labs  06/30/12 1705  WBC 11.7*  HGB 7.5*   HCT 21.4*  MCV 85.3  PLT 388   A  Recent Labs Lab 06/30/12 1705  WBC 11.7*  HGB 7.5*  HCT 21.4*  MCV 85.3  PLT 388    Impression 72 yo BM with HTN, h/o diastolic heart failure, baseline creatinine of 1.2 (06/16/12) with a recent (06/06/12) episode of acute renal failure and hyperkalemia while on lisinopril and spironolactone during a hospital admission at [facility] for hip fracture, that resolved to a creatinine of 1.19 on 06/15/12.  He presents now with recurrent acute renal failure and hyperkalemia, back on those same meds  along with lasix, with poor intake po past week or more .  Recommendations: IVF with isotonic sodium bicarbonate (carefully with h/o diastolic heart failure) Glucose/insulin/kayexelate 45 grams now Foley Stop lisinopril and aldactone (he should never get those drugs again) Renal ultrasound Urine lytes Hopefully will turn around as he did last month with conservative management and discontinuation of offending medications and not require dialysis. Will likely need transfusion for his anemia but would hold off until potassium controlled Check iron studies   Camille Bal,  MD [facility] [phone] pager 06/30/2012, 6:07 PM

## 2012-06-30 NOTE — H&P (Addendum)
PATIENT DETAILS Name: Dale Taylor Age: 72 y.o. Sex: male Date of Birth: Aug 30, 1940 Admit Date: 06/30/2012 [id number] Not In System   CHIEF COMPLAINT:  Transferred from [facility] for acute renal failure and hyperkalemia  HPI: Dale Taylor is a 72 y.o. 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 During that admission his creatinine was approximately 4, he also had hyperkalemia during that admission. Unknown what his renal function was on discharge. He was subsequently discharged to a skilled nursing facility for rehabilitation. Patient is a very poor historian, he is not exactly aware why he was transferred to [facility] emergency room today. His daughter was at bedside, apparently for the past few days while at the skilled nursing facility, patient has had worsening of his renal failure and hyperkalemia. This morning he was found to have altered mental status, and low blood sugars. Per daughter, she thinks her blood sugars were as low as in the 40s. He was then brought to the emergency room at [facility], a CT of the head was done which was negative for acute abnormalities, he was however found to have acute renal failure with hyperkalemia. The ED M.D. placed a left internal jugular line, apparently per the chest x-ray report, tip is in the left subclavian vein, apparently a right femoral jugular triple-lumen catheter was also placed. The patient and the daughter but denied any nausea vomiting or diarrhea. Patient does complain of generalized body aches, but denies any chest pain or shortness of breath. The patient does complain of  very poor appetite over the past few weeks. He is being admitted to [facility] for further evaluation and treatment of his hyperkalemia and acute renal failure.  ALLERGIES:  No Known Allergies  PAST MEDICAL HISTORY: Past Medical History  Diagnosis Date  . Hypertension   . Hypercholesterolemia   . GERD (gastroesophageal reflux disease)   . Dyspnea on exertion   . Claudication in peripheral vascular disease   . PVD (peripheral vascular disease)   . S/P renal artery angioplasty 2009--- W/ STENT X1  TO LEFT COMMON ILIAC    . ED (erectile dysfunction)   . History of BPH   . Frequency of urination   . Urgency of urination   . Nocturia   . Chronic gouty arthritis FEET  . Diabetes mellitus     type II-- ORAL MED  . Prostate cancer SCHEDULED FOR RADIOACTIVE SEED IMPLANTS OF PROSTATE  05-18-2011    FOLLOWED BY DR [facility] AND DR [name]  . CML (chronic myelocytic leukemia) FOLLOWED BY DR LEWIS ([facility])    TAKES SPRYCEL PO MED FOR TX--  PT STATES STABLE  NO SYMPTOMS  . Iron deficiency anemia   . Chronic renal insufficiency   . PAD (peripheral artery disease)   . Cardiovascular disease with arteriosclerosis   . Blood transfusion LAST TIME NOV 2012  --  X2  UNITS  (FOR ANEMIA AND THROMBOCYTOPENIA  . Leukocytosis   . Peripheral edema   . History of thrombocytopenia   . Heart murmur   . CHF (congestive heart failure) ADMISSION NOV 2012  [facility])  REQUESTED RECORDS    DIATOSLIC CHF WITH LEFT VENTRICULAR  IMPAIRED RELAXATION-----   CARDIOLOGIST----  DR Jaynie Bream  . Hypertension   . Arthritis   . History of angina     PAST SURGICAL HISTORY: Past Surgical History  Procedure Laterality Date  . Lung lobectomy  AGE 22 (APPROX.)    RIGHT LOWER LOBECTOMY 20 YEARS AGO, NOT CANCER (PROBLEM HEMORRHAGE)  . Hemorrhoid surgery    . Transthoracic echocardiogram  12-05-2010  [facility])    LEFT VENTRICULAR HYPERTROPHY / PRESERVED SYSTOLIC FUNCTION/ EF  60-65%/  IMPAIRED LEFT VENTRICULAR RELAXATION WITH LEFT ATRIAL ENLARGEMENT/ MILD MITRAL AND TRICUSPID REGURG.  . Cataract extraction w/ intraocular lens  implant, bilateral  2012  . Aortogram/ angioplasty and stenting of left common iliac artery  04-07-2007   DR FIELDS    LEFT COMMON ILIAC STENOSIS 80%, STENTED/ BILATERAL OCCLUSION ANTERIOR TIBIAL ARTERY/ BILATERAL INCOMPLETE PLANTAR ARCH FILLED BY PERONEAL AND POSTERIOR TIBIAL ARTERY/ DIFFUSE BUT PATENT SUPERFICIIAL FEMORAL ARTERIES BILATERLLY  . Cardiovascular stress test  05-19-2011  DR ROBERT KRASOWSKI    NO EVIDENCE OF ISCHEMIA/ NORMAL LVSF/ EF 55%  . Radioactive seed implant  07/21/2011    Procedure: RADIOACTIVE SEED IMPLANT;  Surgeon: Debroah Baller, MD;  Location: [facility];  Service: Urology;  Laterality: N/A;    72   seeds implanted  . Cystoscopy  07/21/2011    Procedure: CYSTOSCOPY;  Surgeon: Debroah Baller, MD;  Location: [facility];  Service: Urology;  Laterality: N/A;  no seeds found in bladder    MEDICATIONS AT HOME: Prior to Admission medications   Medication Sig Start Date End Date Taking? Authorizing Provider  allopurinol (ZYLOPRIM) 300 MG tablet Take 300 mg by mouth daily.   Yes Historical Provider, MD  amLODipine (NORVASC) 5 MG tablet Take 5 mg by mouth daily.   Yes Historical Provider, MD  aspirin 81 MG chewable tablet Chew 81 mg by mouth every other day.   Yes Historical Provider, MD  clopidogrel (PLAVIX) 75 MG tablet Take 75 mg by mouth daily.   Yes Historical Provider, MD  ferrous sulfate 325 (65 FE) MG tablet Take 325 mg by mouth daily with breakfast.   Yes Historical Provider, MD  finasteride (PROSCAR) 5 MG tablet Take 5 mg by mouth daily.   Yes Historical Provider, MD  furosemide (LASIX) 20 MG tablet Take 40-60 mg by mouth 2 (two) times daily. Take 60 mg in the morning and 40 mg in the evening   Yes Historical Provider, MD  glimepiride (AMARYL) 4 MG tablet Take 4 mg by mouth daily before  breakfast.    Yes Historical Provider, MD  hydrALAZINE (APRESOLINE) 10 MG tablet Take 10 mg by mouth 3 (three) times daily.   Yes Historical Provider, MD  ipratropium-albuterol (DUONEB) 0.5-2.5 (3) MG/3ML SOLN Take 3 mLs by nebulization every 6 (six) hours as needed (wheezing).   Yes Historical Provider, MD  isosorbide mononitrate (IMDUR) 30 MG 24 hr tablet Take 30 mg by mouth every morning.    Yes Historical Provider, MD  lisinopril (PRINIVIL,ZESTRIL) 5 MG tablet Take 5 mg by mouth daily.   Yes Historical Provider, MD  lubiprostone (AMITIZA) 24 MCG capsule Take 24 mcg by mouth 2 (two) times daily with a meal.   Yes Historical Provider, MD  meclizine (ANTIVERT) 25 MG tablet Take 25 mg by mouth 3 (three) times daily as needed for dizziness.   Yes Historical Provider, MD  metoprolol (LOPRESSOR) 50 MG tablet Take 50 mg by mouth 2 (two) times daily.   Yes Historical Provider,  MD  nilotinib (TASIGNA) 200 MG capsule Take 200 mg by mouth every 12 (twelve) hours. Give on an empty stomach 1 hour before or 2 hours after meals.   Yes Historical Provider, MD  omega-3 acid ethyl esters (LOVAZA) 1 G capsule Take 1 g by mouth 2 (two) times daily.   Yes Historical Provider, MD  oxybutynin (DITROPAN-XL) 5 MG 24 hr tablet Take 5 mg by mouth at bedtime.   Yes Historical Provider, MD  simvastatin (ZOCOR) 40 MG tablet Take 40 mg by mouth at bedtime.   Yes Historical Provider, MD  spironolactone (ALDACTONE) 25 MG tablet Take 25 mg by mouth daily.   Yes Historical Provider, MD  Tamsulosin HCl (FLOMAX) 0.4 MG CAPS Take 0.4 mg by mouth 2 (two) times daily.    Yes Historical Provider, MD  vitamin E 400 UNIT capsule Take 400 Units by mouth 2 (two) times daily.   Yes Historical Provider, MD    FAMILY HISTORY: Family History  Problem Relation Age of Onset  . Cancer Maternal Aunt     UNKNOWN CANCER DIED AGE 31    SOCIAL HISTORY:  reports that he quit smoking about 33 years ago. His smoking use included Cigarettes. He has  a 30 pack-year smoking history. He has never used smokeless tobacco. He reports that he does not drink alcohol or use illicit drugs.  REVIEW OF SYSTEMS:  Constitutional:   No  weight loss, night sweats,  Fevers, chills, fatigue.  HEENT:    No headaches, Difficulty swallowing,Tooth/dental problems,Sore throat,  No sneezing, itching, ear ache, nasal congestion, post nasal drip,   Cardio-vascular: No chest pain,  Orthopnea, PND, swelling in lower extremities, anasarca, dizziness, palpitations  GI:  No heartburn, indigestion, abdominal pain, nausea, vomiting, diarrhea, change in bowel habits, loss of appetite  Resp: No shortness of breath with exertion or at rest.  No excess mucus, no productive cough, No non-productive cough,  No coughing up of blood.No change in color of mucus.No wheezing.No chest wall deformity  Skin:  no rash or lesions.  GU:  no dysuria, change in color of urine, no urgency or frequency.  No flank pain.  Musculoskeletal: No joint pain or swelling.  No decreased range of motion.  No back pain.  Psych: No change in mood or affect. No depression or anxiety.  No memory loss.   PHYSICAL EXAM: There were no vitals taken for this visit.  General appearance :Awake, alert, not in any distress. Speech is slow but Clear. Not toxic Looking HEENT: Atraumatic and Normocephalic, pupils equally reactive to light and accomodation Neck: supple, no JVD. No cervical lymphadenopathy.  Chest:Good air entry bilaterally, no added sounds  CVS: S1 S2 regular, no murmurs.  Abdomen: Bowel sounds present, Non tender and not distended with no gaurding, rigidity or rebound. Extremities: Right lower extremity shows 2+ edema, mild edema on the left lower extremity as well. Both legs are warm to touch Neurology: Awake alert, and oriented X 3, CN II-XII intact, Non focal Skin:No Rash Wounds:N/A  LABS ON ADMISSION:  No results found for this basename: NA, K, CL, CO2, GLUCOSE, BUN,  CREATININE, CALCIUM, MG, PHOS,  in the last 72 hours No results found for this basename: AST, ALT, ALKPHOS, BILITOT, PROT, ALBUMIN,  in the last 72 hours No results found for this basename: LIPASE, AMYLASE,  in the last 72 hours No results found for this basename: WBC, NEUTROABS, HGB, HCT, MCV, PLT,  in the last 72 hours No results found for this  basename: CKTOTAL, CKMB, CKMBINDEX, TROPONINI,  in the last 72 hours No results found for this basename: DDIMER,  in the last 72 hours No components found with this basename: POCBNP,    RADIOLOGIC STUDIES ON ADMISSION: No results found.   EKG: Independently reviewed. Normal sinus rhythm  ASSESSMENT AND PLAN: Present on Admission:  . Acute renal failure - Suspect this is secondary to prerenal azotemia, medications (ACE inhibitor, Lasix and Aldactone) are the most likely culprits along with very poor oral intake. Patient claims that his oral intake has been very poor with a past 1 week.  - He does have a history of prostate cancer, however from the history obtained-it does not look like he had any significant amount of residual upon placement of the Foley catheter. - We will stop all culprit medications, avoid further nephrotoxic medications and placed on IV fluid hydration, strict I&O's will be done. Hopefully his renal function will recover with the above-noted measures, if not we will consult nephrology. Please note I did briefly discuss the case with Dr. Eliott Nine, they will follow repeat labs today, and monitor the patient and formally consult if needed.  . Hyperkalemia - From acute renal failure, lisinopril and Aldactone therapy.  - Patient had a recent admission to [facility] in May for a hip fracture, during that admission, patient did have acute renal failure with a creatinine of approximately 4 and hyperkalemia. It seems that he was restarted on lisinopril and Aldactone therapy on discharge from .given the fact that, this has reoccurred,  patient would be a very poor candidate to be continued on ACE inhibitor therapy in the future.  - Please note, patient was given insulin along with D50, bicarbonate and Kayexalate while at [facility] emergency room, I have ordered a stat chemistry panel, it is still currently pending. Further treatment plans will be dependent on repeat chemistry panel.  . Hypoglycemia - This is due to acute renal failure and sulfonylurea use.  - Hold Amaryl, monitor CBGs every 2 hours-placed on D5 normal saline.   . Altered mental status - Secondary to toxic metabolite encephalopathy from hypoglycemia and acute renal failure  - This seems to have resolved, currently slightly lethargic but mostly awake and alert. CT of the head done at [facility] emergency room is negative for acute abnormalities   . Anemia - I suspect that this is secondary to acute illness-from recent hip fracture and developing renal failure. There is no overt evidence of blood loss - Will type and screen, suspect he will further drop his hemoglobin as he will be on IV fluid hydration. We will transfuse if his hemoglobin is less than 7. - Check an anemia panel  . HTN (hypertension) - Blood pressure stable but soft, hold all antihypertensive therapy and diuretics for now  - Restart BP meds when able   . Diabetes - Will check A1c with a.m. labs  - Given hypoglycemia, holding all insulin and oral hypoglycemic agent   . Prostate cancer - History of prostate cancer status post radioactive seeding, follows with Dr. [name] at the cancer Center  - Will continue with Flomax and Ditropan  . Chronic diastolic heart failure - History of chronic diastolic heart failure, currently clinically compensated. Will be hydrated overnight for acute renal failure, please watch for signs of fluid overload  . Recent hip fracture - Will get PT evaluation - Does have swelling in his bilateral lower extremity, predominantly on the right, we'll check a  Doppler ultrasound.   Marland Kitchen  History of CML - Is on chronic nilotinib therapy, follows up at the [facility], given acute electrolyte abnormalities-I. will temporarily hold this for now. Please resume when able  .  Intravenous access Issues - Has a left IJ that is not at optimal position, also has a right femoral triple-lumen catheter-I. will see if the IV team can get a peripheral line, if so I will discontinue his left internal jugular catheter. We'll also then consider discontinuing his right femoral triple-lumen catheter as well. - Please note, per the nursing staff, patient does not appear to be a hard stick.  Further plan will depend as patient's clinical course evolves and further radiologic and laboratory data become available. Patient will be monitored closely.  DVT Prophylaxis: - For now I will withhold, pharmacological prophylaxis given anemia, after Doppler ultrasound has been done, we can placed on SCDs.  Code Status: Full Code  Family communication - Spoke with patient's daughter at bedside. Updated her regarding above-noted plan   Total time spent for admission equals 45 minutes.  [facility] Pager [phone]  If 7PM-7AM, please contact night-coverage [email or web address] Password [facility] 06/30/2012, 5:16 PM

## 2012-07-01 ENCOUNTER — Encounter (HOSPITAL_COMMUNITY): Payer: Self-pay

## 2012-07-01 DIAGNOSIS — C61 Malignant neoplasm of prostate: Secondary | ICD-10-CM

## 2012-07-01 DIAGNOSIS — E875 Hyperkalemia: Secondary | ICD-10-CM

## 2012-07-01 DIAGNOSIS — E162 Hypoglycemia, unspecified: Secondary | ICD-10-CM

## 2012-07-01 DIAGNOSIS — E119 Type 2 diabetes mellitus without complications: Secondary | ICD-10-CM

## 2012-07-01 LAB — COMPREHENSIVE METABOLIC PANEL
AST: 19 U/L (ref 0–37)
BUN: 109 mg/dL — ABNORMAL HIGH (ref 6–23)
CO2: 28 mEq/L (ref 19–32)
Calcium: 8 mg/dL — ABNORMAL LOW (ref 8.4–10.5)
Chloride: 97 mEq/L (ref 96–112)
Creatinine, Ser: 5.82 mg/dL — ABNORMAL HIGH (ref 0.50–1.35)
GFR calc Af Amer: 10 mL/min — ABNORMAL LOW (ref >90)
GFR calc non Af Amer: 9 mL/min — ABNORMAL LOW (ref >90)
Glucose, Bld: 221 mg/dL — ABNORMAL HIGH (ref 70–99)
Total Bilirubin: 0.4 mg/dL (ref 0.3–1.2)

## 2012-07-01 LAB — TROPONIN I: Troponin I: <0.3 ng/mL (ref <0.30)

## 2012-07-01 LAB — PROTIME-INR
INR: 1.46 (ref 0.00–1.49)
Prothrombin Time: 17.3 seconds — ABNORMAL HIGH (ref 11.6–15.2)

## 2012-07-01 LAB — BASIC METABOLIC PANEL
BUN: 113 mg/dL — ABNORMAL HIGH (ref 6–23)
CO2: 26 mEq/L (ref 19–32)
Calcium: 8.3 mg/dL — ABNORMAL LOW (ref 8.4–10.5)
Chloride: 95 mEq/L — ABNORMAL LOW (ref 96–112)
GFR calc Af Amer: 10 mL/min — ABNORMAL LOW (ref >90)
GFR calc non Af Amer: 8 mL/min — ABNORMAL LOW (ref >90)
Glucose, Bld: 239 mg/dL — ABNORMAL HIGH (ref 70–99)

## 2012-07-01 LAB — CBC
Hemoglobin: 6.8 g/dL — CL (ref 13.0–17.0)
MCH: 30.1 pg (ref 26.0–34.0)
Platelets: 334 10*3/uL (ref 150–400)
RBC: 2.26 MIL/uL — ABNORMAL LOW (ref 4.22–5.81)
WBC: 10.3 10*3/uL (ref 4.0–10.5)

## 2012-07-01 LAB — DIFFERENTIAL
Basophils Absolute: 0 10*3/uL (ref 0.0–0.1)
Eosinophils Relative: 4 % (ref 0–5)
Lymphocytes Relative: 12 % (ref 12–46)
Neutro Abs: 7.7 10*3/uL (ref 1.7–7.7)

## 2012-07-01 LAB — URINE CULTURE
Colony Count: NO GROWTH
Culture: NO GROWTH

## 2012-07-01 LAB — GLUCOSE, CAPILLARY
Glucose-Capillary: 142 mg/dL — ABNORMAL HIGH (ref 70–99)
Glucose-Capillary: 191 mg/dL — ABNORMAL HIGH (ref 70–99)
Glucose-Capillary: 157 mg/dL — ABNORMAL HIGH (ref 70–99)

## 2012-07-01 LAB — PREPARE RBC (CROSSMATCH)

## 2012-07-01 MED ORDER — SODIUM CHLORIDE 0.9 % IJ SOLN
INTRAMUSCULAR | Status: AC
  Administered 2012-07-01: 10 mL
  Filled 2012-07-01: qty 10

## 2012-07-01 MED ORDER — INSULIN ASPART 100 UNIT/ML ~~LOC~~ SOLN
0.0000 [IU] | Freq: Three times a day (TID) | SUBCUTANEOUS | Status: DC
  Administered 2012-07-01 – 2012-07-06 (×5): 2 [IU] via SUBCUTANEOUS
  Administered 2012-07-07: 1 [IU] via SUBCUTANEOUS
  Administered 2012-07-08: 2 [IU] via SUBCUTANEOUS
  Administered 2012-07-08 – 2012-07-09 (×2): 1 [IU] via SUBCUTANEOUS
  Administered 2012-07-09 – 2012-07-10 (×2): 2 [IU] via SUBCUTANEOUS
  Administered 2012-07-10: 1 [IU] via SUBCUTANEOUS

## 2012-07-01 MED ORDER — OXYCODONE HCL 5 MG PO TABS
5.0000 mg | ORAL_TABLET | Freq: Once | ORAL | Status: AC
Start: 2012-07-01 — End: 2012-07-01
  Administered 2012-07-01: 5 mg via ORAL
  Filled 2012-07-01: qty 1

## 2012-07-01 MED ORDER — FAMOTIDINE 40 MG PO TABS
40.0000 mg | ORAL_TABLET | Freq: Every day | ORAL | Status: DC
  Administered 2012-07-01 – 2012-07-10 (×10): 40 mg via ORAL
  Filled 2012-07-01 (×10): qty 1

## 2012-07-01 MED ORDER — NEPRO/CARBSTEADY PO LIQD
237.0000 mL | Freq: Two times a day (BID) | ORAL | Status: DC
  Administered 2012-07-01 – 2012-07-03 (×4): 237 mL via ORAL
  Filled 2012-07-01 (×4): qty 237

## 2012-07-01 MED ORDER — SODIUM CHLORIDE 0.9 % IV SOLN
INTRAVENOUS | Status: DC
  Administered 2012-07-01 – 2012-07-04 (×5): via INTRAVENOUS

## 2012-07-01 MED ORDER — GI COCKTAIL ~~LOC~~
30.0000 mL | Freq: Two times a day (BID) | ORAL | Status: DC | PRN
  Filled 2012-07-01: qty 30

## 2012-07-01 MED ORDER — SODIUM CHLORIDE 0.9 % IV SOLN
125.0000 mg | Freq: Every day | INTRAVENOUS | Status: AC
  Administered 2012-07-01 – 2012-07-05 (×5): 125 mg via INTRAVENOUS
  Filled 2012-07-01 (×10): qty 10

## 2012-07-01 NOTE — Progress Notes (Signed)
[facility] ROUNDING NOTE  Impression  72 yo BM with HTN, DM, PAD, prostate cancer, CML, h/o diastolic heart failure, baseline creatinine of 1.2 (06/16/12) with a recent (06/06/12) episode of acute renal failure and hyperkalemia while on lisinopril and spironolactone during a hospital admission at [facility] for hip fracture, that resolved to a creatinine of 1.19 on 06/15/12. He presents now with recurrent acute renal failure and hyperkalemia, back on those same meds along with lasix, with poor intake po past week or more .   1.Recurrent acute renal failure and hyperkalemia in the setting of ACE, spironolactone and probable intravascular volume depletion - all improving with conservative management, discontinuation of offending drugs and IVF hydration; K and creatinine both better, good urine output 2. Mild metabolic acidosis - resolved 3. Anemia (CML + renal failure) worsened after IV hydration 4. Iron deficiency 5. Prostate cancer with history of urinary retention Noted during hospitalization last month at [facility] 5. H/O left hip fracture repair 05/2012 6. H/O diastolic CHF 7. S/p NSTEMI 05/2012 post hip repair [facility]) 8. Hypertension prev on ACE normal now on no meds 9. DM 10. CML  Recommendations:  IVF can be changed back to NS and rate slowed Continue with foley and will likely require intermittent straight caths with h/o urinary retention Transfuse a unit of PRBC's (ordered) IV iron to replete iron stores (ordered) then continue po as you have Keep off  lisinopril and aldactone (he should never get those drugs again)  Appears he will turn around as he did last month with conservative management and discontinuation of offending medications not require dialysis.    Subjective:  Feels better but still not hungry and does not remember seeing me last PM No pain or shortness of breath UOP 1350 since admission  Objective Vital signs in last 24 hours: Filed Vitals:   06/30/12 1959 07/01/12 0013 07/01/12 0356 07/01/12 0809  BP:  116/42 120/56   Pulse:  98 93   Temp:  98.8 F (37.1 C) 98.4 F (36.9 C) 98.3 F (36.8 C)  TempSrc:  Oral Oral Oral  Resp:  15 14   Height:      Weight:   83.7 kg (184 lb 8.4 oz)   SpO2: 99% 98% 95%    Weight change:   Intake/Output Summary (Last 24 hours) at 07/01/12 0817 Last data filed at 07/01/12 0810  Gross per 24 hour  Intake   1880 ml  Output   1350 ml  Net    530 ml  Physical examination: Gen: Elderly BM NAD Does not appear to be twitching today  Line removed from left neck; patent line right groin Skin: no rash, cyanosis  Neck: no JVD Chest: Grossly clear anteriorly  Heart: regular, no rub or gallop  Abdomen: soft, no focal tenderness  Ext: Chronic appearing 1+ edema LE's. Healed scar left leg from repaired hip fracture  Neuro: Awake, conversant, more alert. No twitching now Heme/Lymph: no bruising or LAN   Labs: Basic Metabolic Panel:  Recent Labs Lab 06/30/12 1705 06/30/12 1830 07/01/12 0040 07/01/12 0400  NA 129*  --  131* 133*  K 6.4*  --  5.9* 5.4*  CL 95*  --  95* 97  CO2 21  --  26 28  GLUCOSE 102*  --  239* 221*  BUN 114*  --  113* 109*  CREATININE 6.97* 6.91* 6.10* 5.82*  CALCIUM 8.2*  --  8.3* 8.0*   Liver Function Tests:  Recent Labs Lab 06/30/12  1705 07/01/12 0400  AST 25 19  ALT 19 15  ALKPHOS 61 52  BILITOT 0.4 0.4  PROT 5.8* 5.1*  ALBUMIN 2.6* 2.5*   Recent Labs Lab 06/30/12 1705 06/30/12 1830 07/01/12 0400  WBC 11.7* 11.4* 10.3  NEUTROABS  --   --  7.7  HGB 7.5* 7.1* 6.8*  HCT 21.4* 20.4* 19.4*  MCV 85.3 85.4 85.8  PLT 388 369 334   Recent Labs Lab 06/30/12 1801 06/30/12 1955 06/30/12 2202 07/01/12 0012 07/01/12 0757  GLUCAP 128* 112* 142* 226* 157*    Iron Studies:  Recent Labs Lab 06/30/12 1830  IRON 21*  TIBC 152*  FERRITIN 831*   Studies/Results: US Renal  06/30/2012   *RADIOLOGY REPORT*  Clinical Data:  Acute renal failure   RENAL/URINARY TRACT ULTRASOUND COMPLETE  Comparison:  11/04/2010.  Findings:  Right Kidney:  Measures 9.1 cm, previously 11 cm.  Normal in size and parenchymal echogenicity.  No evidence of mass or hydronephrosis.  Left Kidney:  May use 10.4 cm.  Previously 11 cm.  Normal in size and parenchymal echogenicity.  No evidence of mass or hydronephrosis.  Bladder:  Collapsed around Foley catheter balloon.  IMPRESSION: 1.  No explanation for patient's acute renal failure.  No evidence for hydronephrosis.   Original Report Authenticated By: Signa Kell, M.D.   Medications: .  sodium bicarbonate  infusion 1000 mL 125 mL/hr at 07/01/12 0449   . ipratropium  0.5 mg Nebulization TID   And  . albuterol  2.5 mg Nebulization TID  . aspirin  81 mg Oral Q48H  . clopidogrel  75 mg Oral Daily  . ferrous sulfate  325 mg Oral Q breakfast  . finasteride  5 mg Oral Daily  . lubiprostone  24 mcg Oral BID WC  . oxybutynin  5 mg Oral QHS  . simvastatin  40 mg Oral QHS  . sodium chloride  3 mL Intravenous Q12H  . tamsulosin  0.4 mg Oral BID  . vitamin E  400 Units Oral BID    I  have reviewed scheduled and prn medications.  Camille Bal, MD [facility] [phone] pager 07/01/2012, 8:17 AM

## 2012-07-01 NOTE — Progress Notes (Signed)
Pt. Transferred to room 6735; vss.  Vivi Martens RN

## 2012-07-01 NOTE — Progress Notes (Signed)
Patient arrived to room from 3100. Patient is A/O and has no pain. Patient was placed on telemetry. Patient vitals were taken. Patient is stable and will continue to be monitored.

## 2012-07-01 NOTE — Progress Notes (Signed)
Patient's BS in the 200s at giving an amp of D50 to lower K levels. Elray Mcgregor notified. No new orders received. Will continue to monitor.   Rochele Pages, RN

## 2012-07-01 NOTE — Progress Notes (Signed)
CRITICAL VALUE ALERT  Critical value received:  Hgb 6.8   Date of notification:  07/01/12  Time of notification:  0420  Critical value read back:yes  Nurse who received alert:  Kathlene Cote  MD notified (1st page):  Elray Mcgregor  Time of first page:  0430  MD notified (2nd page):  Time of second page:  Responding MD:  Elray Mcgregor  Time MD responded:  [phone], no new orders received, will continue to monitor.

## 2012-07-01 NOTE — Progress Notes (Addendum)
Pt. C/O Chest Pain/pressure; blood has now been started; EKG done; MD made ware; orders received;  will continue to monitor.  Vivi Martens RN

## 2012-07-01 NOTE — Progress Notes (Signed)
PT Cancellation Note  Patient Details Name: Jaiden Dinkins MRN: [id number] DOB: 14-Mar-1940   Cancelled Treatment:    Reason Eval/Treat Not Completed: Patient not medically ready. Pt with HgB of 6.8. RN asked PT to wait until after pt received unit of blood. PT to return as able.   Marcene Brawn 07/01/2012, 10:53 AM

## 2012-07-01 NOTE — Progress Notes (Signed)
[facility] PROGRESS NOTE  Dale Taylor HQI:[id number] DOB: November 28, 1940 DOA: 06/30/2012 PCP: Provider Not In System  Assessment/Plan: Acute on chronic renal failure  - Suspect this is secondary to prerenal azotemia, medications (ACE inhibitor, Lasix and Aldactone) are the most likely culprits along with very poor oral intake. Patient  -will follow further renal recommendations -transition IVF's to NS at 75cc/hr -follow electrolytes -continue holding nephrotoxic agents -CR improving, no HD anticipated at this time.  . Hyperkalemia  - From acute renal failure, lisinopril and Aldactone therapy.  - resolving; K down to 5.4 after kayexalate -will continue monitoring on telemetry -renal panel in am  . Hypoglycemia  - This is due to acute renal failure and sulfonylurea use.  - Hold Amaryl -now CBG's in 220 range will start SSI (sensitive, no meal coverage, no HS)  . Altered mental status  - Secondary to toxic metabolite encephalopathy from hypoglycemia and acute renal failure. -improving. -will monitor.  . Anemia  - I suspect that this is a combination secondary to acute blood loss from  recent hip fracture and chronic renal failure. There is no overt evidence of blood loss. -will replete iron as recommended by renal -transfuse one unit PRBC and follow Hgb trend.  Marland Kitchen HTN (hypertension)  - Blood pressure stable now -will slowly restart antihypertensive agents if continue to be stable and BP continue rising; no ACE's, no aldactone  . Diabetes  -will check A1C  -will hold oral hypoglycemic agents -start SSI  . Prostate cancer  - History of prostate cancer status post radioactive seeding, follows with Dr. [name] [facility] the [facility]  - Will continue with Flomax and Ditropan   . Chronic diastolic heart failure  - History of chronic diastolic heart failure, currently clinically compensated. Will be hydrated overnight for acute renal failure, please watch for signs of fluid  overload   . Recent hip fracture  - Will follow PT eval and tx -LE's dopplers to r/o DVT.  Marland Kitchen History of CML  - Is on chronic nilotinib therapy, follows up at the [facility] -will continue holding nilotinib for now  . Intravenous access Issues  - will place 2 peripheral IV's and will discontinue femoral line. IJ discontinue on 6/20  Moderate protein calorie malnutrition and hypoalbuminemia: -will start Nepro BID -encourage PO intake  DVT Prophylaxis: SCD's after vascular studies  Code Status: Full Family Communication: no family at bedside Disposition Plan: to be determine   Consultants:  nephrology  Procedures:  See below for x-ray reports  Antibiotics:  none  HPI/Subjective: Patient feeling better today, less confuse during my exam (missing just day and year). Afebrile and complaining of pain on his left hip area when laying on that side  Objective: Filed Vitals:   06/30/12 1959 07/01/12 0013 07/01/12 0356 07/01/12 0809  BP:  116/42 120/56   Pulse:  98 93   Temp:  98.8 F (37.1 C) 98.4 F (36.9 C) 98.3 F (36.8 C)  TempSrc:  Oral Oral Oral  Resp:  15 14   Height:      Weight:   83.7 kg (184 lb 8.4 oz)   SpO2: 99% 98% 95%     Intake/Output Summary (Last 24 hours) at 07/01/12 1011 Last data filed at 07/01/12 0810  Gross per 24 hour  Intake   1880 ml  Output   1350 ml  Net    530 ml   Filed Weights   07/01/12 0356  Weight: 83.7 kg (184 lb 8.4 oz)  Exam:   General:  AAOX2 (missing day and year); afebrile, feeling better  Cardiovascular: S1 and S2, no rubs or gallops  Respiratory: CTA bilaterally  Abdomen: soft, NT, ND, positive BS  Musculoskeletal: Trace edema, no cyanosis  Data Reviewed: Basic Metabolic Panel:  Recent Labs Lab 06/30/12 1705 06/30/12 1830 07/01/12 0040 07/01/12 0400  NA 129*  --  131* 133*  K 6.4*  --  5.9* 5.4*  CL 95*  --  95* 97  CO2 21  --  26 28  GLUCOSE 102*  --  239* 221*  BUN 114*  --   113* 109*  CREATININE 6.97* 6.91* 6.10* 5.82*  CALCIUM 8.2*  --  8.3* 8.0*   Liver Function Tests:  Recent Labs Lab 06/30/12 1705 07/01/12 0400  AST 25 19  ALT 19 15  ALKPHOS 61 52  BILITOT 0.4 0.4  PROT 5.8* 5.1*  ALBUMIN 2.6* 2.5*   CBC:  Recent Labs Lab 06/30/12 1705 06/30/12 1830 07/01/12 0400  WBC 11.7* 11.4* 10.3  NEUTROABS  --   --  7.7  HGB 7.5* 7.1* 6.8*  HCT 21.4* 20.4* 19.4*  MCV 85.3 85.4 85.8  PLT 388 369 334   CBG:  Recent Labs Lab 07/01/12 0012 07/01/12 0208 07/01/12 0511 07/01/12 0620 07/01/12 0757  GLUCAP 226* 233* 127* 191* 157*    Recent Results (from the past 240 hour(s))  MRSA PCR SCREENING     Status: None   Collection Time    06/30/12  6:37 PM      Result Value Range Status   MRSA by PCR NEGATIVE  NEGATIVE Final   Comment:            The GeneXpert MRSA Assay (FDA     approved for NASAL specimens     only), is one component of a     comprehensive MRSA colonization     surveillance program. It is not     intended to diagnose MRSA     infection nor to guide or     monitor treatment for     MRSA infections.     Studies: US Renal  06/30/2012   *RADIOLOGY REPORT*  Clinical Data:  Acute renal failure  RENAL/URINARY TRACT ULTRASOUND COMPLETE  Comparison:  11/04/2010.  Findings:  Right Kidney:  Measures 9.1 cm, previously 11 cm.  Normal in size and parenchymal echogenicity.  No evidence of mass or hydronephrosis.  Left Kidney:  May use 10.4 cm.  Previously 11 cm.  Normal in size and parenchymal echogenicity.  No evidence of mass or hydronephrosis.  Bladder:  Collapsed around Foley catheter balloon.  IMPRESSION: 1.  No explanation for patient's acute renal failure.  No evidence for hydronephrosis.   Original Report Authenticated By: Signa Kell, M.D.    Scheduled Meds: . ipratropium  0.5 mg Nebulization TID   And  . albuterol  2.5 mg Nebulization TID  . aspirin  81 mg Oral Q48H  . clopidogrel  75 mg Oral Daily  . ferric gluconate  (FERRLECIT/NULECIT) IV  125 mg Intravenous Daily  . ferrous sulfate  325 mg Oral Q breakfast  . finasteride  5 mg Oral Daily  . lubiprostone  24 mcg Oral BID WC  . oxybutynin  5 mg Oral QHS  . simvastatin  40 mg Oral QHS  . sodium chloride  3 mL Intravenous Q12H  . tamsulosin  0.4 mg Oral BID  . vitamin E  400 Units Oral BID   Continuous Infusions: . sodium  chloride 75 mL/hr at 07/01/12 1009    Principal Problem:   Acute renal failure Active Problems:   Prostate cancer   Hyperkalemia   Anemia   Hypoglycemia   Altered mental status   HTN (hypertension)   Diabetes   Chronic diastolic heart failure   Intertrochanteric fracture of left hip    Time spent: >30 minutes   Mirl Hillery  [facility] Pager [phone]. If 7PM-7AM, please contact night-coverage at [email or web address], password [facility] 07/01/2012, 10:11 AM  LOS: 1 day

## 2012-07-02 DIAGNOSIS — I10 Essential (primary) hypertension: Secondary | ICD-10-CM

## 2012-07-02 DIAGNOSIS — M7989 Other specified soft tissue disorders: Secondary | ICD-10-CM

## 2012-07-02 LAB — DIFFERENTIAL
Basophils Absolute: 0 10*3/uL (ref 0.0–0.1)
Basophils Relative: 0 % (ref 0–1)
Eosinophils Absolute: 0.9 10*3/uL — ABNORMAL HIGH (ref 0.0–0.7)
Lymphs Abs: 1.8 10*3/uL (ref 0.7–4.0)
Neutrophils Relative %: 65 % (ref 43–77)

## 2012-07-02 LAB — GLUCOSE, CAPILLARY
Glucose-Capillary: 68 mg/dL — ABNORMAL LOW (ref 70–99)
Glucose-Capillary: 108 mg/dL — ABNORMAL HIGH (ref 70–99)
Glucose-Capillary: 111 mg/dL — ABNORMAL HIGH (ref 70–99)

## 2012-07-02 LAB — TYPE AND SCREEN: Unit division: 0

## 2012-07-02 LAB — CBC
MCHC: 33.8 g/dL (ref 30.0–36.0)
Platelets: 352 10*3/uL (ref 150–400)
RDW: 14.8 % (ref 11.5–15.5)
WBC: 10.1 10*3/uL (ref 4.0–10.5)

## 2012-07-02 LAB — RENAL FUNCTION PANEL
Albumin: 2.6 g/dL — ABNORMAL LOW (ref 3.5–5.2)
Calcium: 8.3 mg/dL — ABNORMAL LOW (ref 8.4–10.5)
GFR calc Af Amer: 17 mL/min — ABNORMAL LOW (ref >90)
GFR calc non Af Amer: 15 mL/min — ABNORMAL LOW (ref >90)
Glucose, Bld: 78 mg/dL (ref 70–99)
Phosphorus: 4.5 mg/dL (ref 2.3–4.6)
Potassium: 5.4 mEq/L — ABNORMAL HIGH (ref 3.5–5.1)
Sodium: 134 mEq/L — ABNORMAL LOW (ref 135–145)

## 2012-07-02 LAB — TROPONIN I: Troponin I: <0.3 ng/mL (ref <0.30)

## 2012-07-02 MED ORDER — IPRATROPIUM BROMIDE 0.02 % IN SOLN: 0.5000 mg | Freq: Two times a day (BID) | RESPIRATORY_TRACT | Status: DC

## 2012-07-02 MED ORDER — NILOTINIB HCL 150 MG PO CAPS
300.0000 mg | ORAL_CAPSULE | Freq: Two times a day (BID) | ORAL | Status: DC
  Administered 2012-07-02 – 2012-07-10 (×16): 300 mg via ORAL
  Filled 2012-07-02 (×4): qty 2

## 2012-07-02 MED ORDER — IPRATROPIUM BROMIDE 0.02 % IN SOLN
0.5000 mg | Freq: Two times a day (BID) | RESPIRATORY_TRACT | Status: DC
  Administered 2012-07-02 – 2012-07-07 (×9): 0.5 mg via RESPIRATORY_TRACT
  Filled 2012-07-02 (×10): qty 2.5

## 2012-07-02 MED ORDER — ALBUTEROL SULFATE (5 MG/ML) 0.5% IN NEBU: 2.5000 mg | INHALATION_SOLUTION | Freq: Three times a day (TID) | RESPIRATORY_TRACT | Status: DC

## 2012-07-02 MED ORDER — ALBUTEROL SULFATE (5 MG/ML) 0.5% IN NEBU
2.5000 mg | INHALATION_SOLUTION | Freq: Two times a day (BID) | RESPIRATORY_TRACT | Status: DC
  Administered 2012-07-02 – 2012-07-07 (×9): 2.5 mg via RESPIRATORY_TRACT
  Filled 2012-07-02 (×10): qty 0.5

## 2012-07-02 MED ORDER — NILOTINIB HCL 200 MG PO CAPS: 200.0000 mg | ORAL_CAPSULE | Freq: Two times a day (BID) | ORAL | Status: DC

## 2012-07-02 NOTE — Evaluation (Signed)
Physical Therapy Evaluation Patient Details Name: Dale Taylor MRN: [id number] DOB: [birth year] Today's Date: 07/02/2012 Time: 1308-6578 PT Time Calculation (min): 28 min  PT Assessment / Plan / Recommendation Clinical Impression  72 yo male admitted from SNF (where he was working on rehab after a L hip fracture) with acute renal failure; Presents to PT with decr functional mobility; Will benefit from Pt to maximize independence and safety with mobility  At this time, his WBing status on the LLE is unknown; Will minimize WBing until this is clarified    PT Assessment  Patient needs continued PT services    Follow Up Recommendations  SNF    Does the patient have the potential to tolerate intense rehabilitation      Barriers to Discharge None      Equipment Recommendations  Other (comment) (tbd)    Recommendations for Other Services     Frequency Min 3X/week    Precautions / Restrictions Precautions Precautions: Fall Restrictions Weight Bearing Restrictions: Yes LLE Weight Bearing:  (prev L hip fx; will minimize LLE WBing until WBing status is clarified)   Pertinent Vitals/Pain Grimaced with LLE movement; did not rate patient repositioned for comfort       Mobility  Bed Mobility Bed Mobility: Supine to Sit;Sitting - Scoot to Edge of Bed Supine to Sit: 1: +2 Total assist;HOB flat;With rails Supine to Sit: Patient Percentage: 60% Sitting - Scoot to Edge of Bed: 3: Mod assist Details for Bed Mobility Assistance: phyiscal assist to elevate trunk form bed; very good half-bridge to EOB in prep for getting up Transfers Transfers: Heritage manager Transfers: 1: +2 Total assist Squat Pivot Transfers: Patient Percentage: 50% Details for Transfer Assistance: Squat pivot transfer to pt's Right side; Attempted to minimize WBing LLE as pt's WBing status is unknown, still he did bear some weight through his L LE; Cues for technique, safety, and hand placement     Exercises     PT Diagnosis: Generalized weakness;Acute pain;Difficulty walking  PT Problem List: Decreased strength;Decreased activity tolerance;Decreased balance;Decreased mobility;Decreased cognition;Decreased knowledge of use of DME;Decreased safety awareness;Decreased knowledge of precautions;Pain PT Treatment Interventions: DME instruction;Gait training;Functional mobility training;Therapeutic activities;Therapeutic exercise;Balance training;Cognitive remediation;Patient/family education   PT Goals Acute Rehab PT Goals PT Goal Formulation: Patient unable to participate in goal setting Time For Goal Achievement: 07/16/12 Potential to Achieve Goals: Good Pt will go Supine/Side to Sit: with min assist PT Goal: Supine/Side to Sit - Progress: Goal set today Pt will go Sit to Supine/Side: with min assist PT Goal: Sit to Supine/Side - Progress: Goal set today Pt will go Sit to Stand: with min assist PT Goal: Sit to Stand - Progress: Goal set today Pt will go Stand to Sit: with min assist PT Goal: Stand to Sit - Progress: Goal set today Pt will Transfer Bed to Chair/Chair to Bed: with min assist PT Transfer Goal: Bed to Chair/Chair to Bed - Progress: Goal set today Pt will Perform Home Exercise Program: with min assist PT Goal: Perform Home Exercise Program - Progress: Goal set today  Visit Information  Last PT Received On: 07/02/12 Assistance Needed: +2    Subjective Data  Subjective: Agreeable to OOB Patient Stated Goal: Not stated   Prior Functioning  Home Living Available Help at Discharge: Skilled Nursing Facility Type of Home: Skilled Nursing Facility Additional Comments: was getting rehab for his L hip fracture at SNF PTA Prior Function Level of Independence: Needs assistance Comments: On eval, pt unable to give clear  picture of level of assist he was at SNF; Pt also unable to state his WBing status LLE Communication Communication: No difficulties    Cognition   Cognition Arousal/Alertness: Lethargic Behavior During Therapy: WFL for tasks assessed/performed Overall Cognitive Status: No family/caregiver present to determine baseline cognitive functioning    Extremity/Trunk Assessment Right Upper Extremity Assessment RUE ROM/Strength/Tone: [facility] for tasks assessed Left Upper Extremity Assessment LUE ROM/Strength/Tone: WFL for tasks assessed Right Lower Extremity Assessment RLE ROM/Strength/Tone: Deficits RLE ROM/Strength/Tone Deficits: Generalized weakness Left Lower Extremity Assessment LLE ROM/Strength/Tone: Deficits LLE ROM/Strength/Tone Deficits: decr AROM and strength, limited by pain in hip   Balance    End of Session PT - End of Session Equipment Utilized During Treatment: Gait belt Activity Tolerance: Patient tolerated treatment well Patient left: in chair;with call bell/phone within reach Nurse Communication: Mobility status  GP     Olen Pel [place], Andrew [phone]  07/02/2012, 3:18 PM

## 2012-07-02 NOTE — Progress Notes (Signed)
[facility] PROGRESS NOTE  Dale Taylor WUJ:[id number] DOB: 09/12/1940 DOA: 06/30/2012 PCP: Provider Not In System  Assessment/Plan: Acute on chronic renal failure  - Suspect this is secondary to prerenal azotemia, medications (ACE inhibitor, Lasix and Aldactone) are the most likely culprits along with very poor oral intake. Patient  -will follow further renal recommendations -transition IVF's to NS at 75cc/hr (for another day and then will change to [facility]) -follow electrolytes -continue holding nephrotoxic agents -CR improving (down to 3.8, no HD anticipated at this time).  . Hyperkalemia  - From acute renal failure, lisinopril and Aldactone therapy.  - resolving; K down to 5.4 after kayexalate -will continue monitoring on telemetry -BMET in am  . Hypoglycemia  - This is due to acute renal failure and sulfonylurea use.  - Hold Amaryl -now CBG's in 220 range will start SSI (sensitive, no meal coverage, no HS)  . Altered mental status  - Secondary to toxic metabolite encephalopathy from hypoglycemia and acute renal failure. -continue improving and pretty much back to baseline -will monitor.  . Anemia  - I suspect that this is a combination secondary to acute blood loss from  recent hip fracture and chronic renal failure. There is no overt evidence of blood loss. -will replete iron as recommended by renal -transfuse one unit PRBC and follow Hgb trend.  Marland Kitchen HTN (hypertension)  - Blood pressure stable now -will continue avoiding ACE and aldactone  . Diabetes  -A1C 5.5 -will hold oral hypoglycemic agents -start SSI w/o meal or bedtime coverage -base on his A1C, no needs for outpatient hypoglycemic regimen at this point.  . Prostate cancer and urinary retention - History of prostate cancer status post radioactive seeding, follows with Dr. [name] at the cancer [facility]  - Will continue with Flomax and Ditropan   . Chronic diastolic heart failure  -History of chronic diastolic  heart failure, currently clinically compensated.  -will continue monitoring I's and O's and start decreasing IVF's   . Recent hip fracture  - Will follow PT eval and tx -LE's dopplers negative for DVT.  Marland Kitchen History of CML  - Is on chronic nilotinib therapy, follows up at the [facility] -will resume nilotinib   . Intravenous access Issues  - will place 2 peripheral IV's and will discontinue femoral line. IJ discontinue on 6/20   Moderate protein calorie malnutrition and hypoalbuminemia: -will start Nepro BID -encourage PO intake  DVT Prophylaxis: SCD's   Code Status: Full Family Communication: no family at bedside Disposition Plan: to be determine  Consultants:  nephrology  Procedures:  See below for x-ray reports  Antibiotics:  none  HPI/Subjective: Patient feeling better today, afebrile and denying CP or SOB.  Objective: Filed Vitals:   07/01/12 1852 07/01/12 2038 07/02/12 0513 07/02/12 1011  BP: 107/44 109/42 117/45 112/42  Pulse: 96 96 97 109  Temp: 98.3 F (36.8 C) 98.3 F (36.8 C) 98.4 F (36.9 C) 98.2 F (36.8 C)  TempSrc: Oral Oral Oral Oral  Resp: 16 16 16 16   Height:  5\' 11"  (1.803 m)    Weight:  83.7 kg (184 lb 8.4 oz)    SpO2: 96% 99% 96% 100%    Intake/Output Summary (Last 24 hours) at 07/02/12 1404 Last data filed at 07/02/12 0600  Gross per 24 hour  Intake   1305 ml  Output   1550 ml  Net   -245 ml   Filed Weights   07/01/12 0356 07/01/12 2038  Weight: 83.7 kg (184  lb 8.4 oz) 83.7 kg (184 lb 8.4 oz)    Exam:   General:  AAOX2; afebrile, feeling better; no CP  Cardiovascular: S1 and S2, no rubs or gallops  Respiratory: CTA bilaterally  Abdomen: soft, NT, ND, positive BS  Musculoskeletal: Trace edema, no cyanosis  Data Reviewed: Basic Metabolic Panel:  Recent Labs Lab 06/30/12 1705 06/30/12 1830 07/01/12 0040 07/01/12 0400 07/02/12 0535  NA 129*  --  131* 133* 134*  K 6.4*  --  5.9* 5.4* 5.4*  CL 95*   --  95* 97 98  CO2 21  --  26 28 25   GLUCOSE 102*  --  239* 221* 78  BUN 114*  --  113* 109* 87*  CREATININE 6.97* 6.91* 6.10* 5.82* 3.81*  CALCIUM 8.2*  --  8.3* 8.0* 8.3*  PHOS  --   --   --   --  4.5   Liver Function Tests:  Recent Labs Lab 06/30/12 1705 07/01/12 0400 07/02/12 0535  AST 25 19  --   ALT 19 15  --   ALKPHOS 61 52  --   BILITOT 0.4 0.4  --   PROT 5.8* 5.1*  --   ALBUMIN 2.6* 2.5* 2.6*   CBC:  Recent Labs Lab 06/30/12 1705 06/30/12 1830 07/01/12 0400 07/02/12 0535  WBC 11.7* 11.4* 10.3 10.1  NEUTROABS  --   --  7.7 6.5  HGB 7.5* 7.1* 6.8* 7.8*  HCT 21.4* 20.4* 19.4* 23.1*  MCV 85.3 85.4 85.8 88.2  PLT 388 369 334 352   CBG:  Recent Labs Lab 07/01/12 1146 07/01/12 1720 07/02/12 0749 07/02/12 0840 07/02/12 1211  GLUCAP 102* 176* 68* 108* 102*    Recent Results (from the past 240 hour(s))  URINE CULTURE     Status: None   Collection Time    06/30/12  5:35 PM      Result Value Range Status   Specimen Description URINE, CATHETERIZED   Final   Special Requests NONE   Final   Culture  Setup Time 06/30/2012 18:38   Final   Colony Count NO GROWTH   Final   Culture NO GROWTH   Final   Report Status 07/01/2012 FINAL   Final  MRSA PCR SCREENING     Status: None   Collection Time    06/30/12  6:37 PM      Result Value Range Status   MRSA by PCR NEGATIVE  NEGATIVE Final   Comment:            The GeneXpert MRSA Assay (FDA     approved for NASAL specimens     only), is one component of a     comprehensive MRSA colonization     surveillance program. It is not     intended to diagnose MRSA     infection nor to guide or     monitor treatment for     MRSA infections.     Studies: US Renal  06/30/2012   *RADIOLOGY REPORT*  Clinical Data:  Acute renal failure  RENAL/URINARY TRACT ULTRASOUND COMPLETE  Comparison:  11/04/2010.  Findings:  Right Kidney:  Measures 9.1 cm, previously 11 cm.  Normal in size and parenchymal echogenicity.  No evidence  of mass or hydronephrosis.  Left Kidney:  May use 10.4 cm.  Previously 11 cm.  Normal in size and parenchymal echogenicity.  No evidence of mass or hydronephrosis.  Bladder:  Collapsed around Foley catheter balloon.  IMPRESSION: 1.  No explanation  for patient's acute renal failure.  No evidence for hydronephrosis.   Original Report Authenticated By: Signa Kell, M.D.    Scheduled Meds: . albuterol  2.5 mg Nebulization BID   And  . ipratropium  0.5 mg Nebulization BID  . aspirin  81 mg Oral Q48H  . clopidogrel  75 mg Oral Daily  . famotidine  40 mg Oral Daily  . feeding supplement (NEPRO CARB STEADY)  237 mL Oral BID BM  . ferric gluconate (FERRLECIT/NULECIT) IV  125 mg Intravenous Daily  . ferrous sulfate  325 mg Oral Q breakfast  . finasteride  5 mg Oral Daily  . insulin aspart  0-9 Units Subcutaneous TID WC  . lubiprostone  24 mcg Oral BID WC  . oxybutynin  5 mg Oral QHS  . simvastatin  40 mg Oral QHS  . sodium chloride  3 mL Intravenous Q12H  . tamsulosin  0.4 mg Oral BID  . vitamin E  400 Units Oral BID   Continuous Infusions: . sodium chloride 75 mL/hr at 07/02/12 1325    Principal Problem:   Acute renal failure Active Problems:   Prostate cancer   Hyperkalemia   Anemia   Hypoglycemia   Altered mental status   HTN (hypertension)   Diabetes   Chronic diastolic heart failure   Intertrochanteric fracture of left hip    Time spent: >30 minutes   Alnisa Hasley  [facility] Pager [phone]. If 7PM-7AM, please contact night-coverage at [email or web address], password [facility] 07/02/2012, 2:04 PM  LOS: 2 days

## 2012-07-02 NOTE — Progress Notes (Signed)
VASCULAR LAB PRELIMINARY  PRELIMINARY  PRELIMINARY  PRELIMINARY  Bilateral lower extremity venous Dopplers completed.    Preliminary report:  There is no DVT or SVT noted in the bilateral lower extremities.  Sundeep Destin, RVT 07/02/2012, 9:06 AM

## 2012-07-02 NOTE — Progress Notes (Signed)
CBG: 68  Treatment: 15 GM carbohydrate snack  Symptoms: None  Follow-up CBG: 841  Time:CBG Result:108  Possible Reasons for Event: Inadequate meal intake  Comments/MD notified:yes

## 2012-07-02 NOTE — Progress Notes (Signed)
[facility] ROUNDING NOTE  Impression  72 yo BM with HTN, DM, PAD, prostate cancer, CML, h/o diastolic heart failure, baseline creatinine of 1.2 (06/16/12) with a recent (06/06/12) episode of acute renal failure and hyperkalemia while on lisinopril and spironolactone during a hospital admission at [facility] for hip fracture, that resolved to a creatinine of 1.19 on 06/15/12. He presents now with recurrent acute renal failure and hyperkalemia, back on those same meds along with lasix, with poor intake po past week or more .   Recurrent acute renal failure and hyperkalemia in the setting of ACE, spironolactone and probable intravascular volume depletion  Continued improvement with conservative management, discontinuation of offending drugs and IVF hydration Still mild hyperkalemia but change in Rx not indicated Excellent UOP (3350 last 24 hours) Expect continued recovery He should not get lisinopril or aldactone ever again in my opinion  Mild metabolic acidosis Resolved  Anemia (CML + renal failure)  worsened after IV hydration Transfused 1 unit 6/21 Improved to 7.8 today  Iron deficiency Getting ferrelecit for restoration of iron stores (daily X 5 started 6/21)  Prostate cancer with history of urinary retention  Urinary retention noted during hospitalization last month at [facility] Continue with foley and will likely require intermittent straight caths with h/o urinary retention  H/O left hip fracture repair 05/2012 H/O diastolic CHF S/p NSTEMI 05/2012 post hip repair [facility]) Hypertension prev on ACE normal now on no meds DM CML  Final Recommendations:  Given the continued improvement in acute renal failure, with excellent UOP, nothing to add to your management. Expect continued recovery without the need for dialysis Additional recommendations as previously highlighted He should not get lisinopril or aldactone ever again in my opinion Will sign off, but please call  if questions arise  Subjective:  Still pretty sleepy but awakens and answers questions Large amount of urine in foley I/O last 3 completed shifts: In: 3202.5 [P.O.:720; I.V.:2345; Blood:137.5] Out: 3350 [Urine:3350]   Objective Vital signs in last 24 hours: Filed Vitals:   07/01/12 1852 07/01/12 2038 07/02/12 0513 07/02/12 1011  BP: 107/44 109/42 117/45 112/42  Pulse: 96 96 97 109  Temp: 98.3 F (36.8 C) 98.3 F (36.8 C) 98.4 F (36.9 C) 98.2 F (36.8 C)  TempSrc: Oral Oral Oral Oral  Resp: 16 16 16 16   Height:  5\' 11"  (1.803 m)    Weight:  83.7 kg (184 lb 8.4 oz)    SpO2: 96% 99% 96% 100%   Physical examination: Gen: Elderly BM NAD Not twitching today but still seems somewhat lethargic Patent line right groin Skin: no rash, cyanosis  Neck: no JVD Chest: Grossly clear anteriorly  Heart: regular, no rub or gallop  Abdomen: soft, no focal tenderness  Ext: Chronic appearing 1+ edema LE's. Healed scar left leg from repaired hip fracture  Neuro: Sleepy but awakens and answers questions   Basic Metabolic Panel:  Recent Labs Lab 06/30/12 1705 06/30/12 1830 07/01/12 0040 07/01/12 0400 07/02/12 0535  NA 129*  --  131* 133* 134*  K 6.4*  --  5.9* 5.4* 5.4*  CL 95*  --  95* 97 98  CO2 21  --  26 28 25   GLUCOSE 102*  --  239* 221* 78  BUN 114*  --  113* 109* 87*  CREATININE 6.97* 6.91* 6.10* 5.82* 3.81*  CALCIUM 8.2*  --  8.3* 8.0* 8.3*  PHOS  --   --   --   --  4.5   Liver  Function Tests:  Recent Labs Lab 06/30/12 1705 07/01/12 0400 07/02/12 0535  AST 25 19  --   ALT 19 15  --   ALKPHOS 61 52  --   BILITOT 0.4 0.4  --   PROT 5.8* 5.1*  --   ALBUMIN 2.6* 2.5* 2.6*    Recent Labs Lab 06/30/12 1705 06/30/12 1830 07/01/12 0400 07/02/12 0535  WBC 11.7* 11.4* 10.3 10.1  NEUTROABS  --   --  7.7 6.5  HGB 7.5* 7.1* 6.8* 7.8*  HCT 21.4* 20.4* 19.4* 23.1*  MCV 85.3 85.4 85.8 88.2  PLT 388 369 334 352    Recent Labs Lab 07/01/12 0757 07/01/12 1146  07/01/12 1720 07/02/12 0749 07/02/12 0840  GLUCAP 157* 102* 176* 68* 108*    Iron Studies:   Recent Labs Lab 06/30/12 1830  IRON 21*  TIBC 152*  FERRITIN 831*   Studies/Results: US Renal  06/30/2012   *RADIOLOGY REPORT*  Clinical Data:  Acute renal failure  RENAL/URINARY TRACT ULTRASOUND COMPLETE  Comparison:  11/04/2010.  Findings:  Right Kidney:  Measures 9.1 cm, previously 11 cm.  Normal in size and parenchymal echogenicity.  No evidence of mass or hydronephrosis.  Left Kidney:  May use 10.4 cm.  Previously 11 cm.  Normal in size and parenchymal echogenicity.  No evidence of mass or hydronephrosis.  Bladder:  Collapsed around Foley catheter balloon.  IMPRESSION: 1.  No explanation for patient's acute renal failure.  No evidence for hydronephrosis.   Original Report Authenticated By: Signa Kell, M.D.   Medications: . sodium chloride 75 mL/hr at 07/01/12 2124   . ipratropium  0.5 mg Nebulization TID   And  . albuterol  2.5 mg Nebulization TID  . aspirin  81 mg Oral Q48H  . clopidogrel  75 mg Oral Daily  . famotidine  40 mg Oral Daily  . feeding supplement (NEPRO CARB STEADY)  237 mL Oral BID BM  . ferric gluconate (FERRLECIT/NULECIT) IV  125 mg Intravenous Daily  . ferrous sulfate  325 mg Oral Q breakfast  . finasteride  5 mg Oral Daily  . insulin aspart  0-9 Units Subcutaneous TID WC  . lubiprostone  24 mcg Oral BID WC  . oxybutynin  5 mg Oral QHS  . simvastatin  40 mg Oral QHS  . sodium chloride  3 mL Intravenous Q12H  . tamsulosin  0.4 mg Oral BID  . vitamin E  400 Units Oral BID    I  have reviewed scheduled and prn medications.  Camille Bal, MD [facility] [phone] pager 07/02/2012, 11:11 AM

## 2012-07-03 DIAGNOSIS — N184 Chronic kidney disease, stage 4 (severe): Secondary | ICD-10-CM

## 2012-07-03 LAB — CBC
MCH: 28.8 pg (ref 26.0–34.0)
MCHC: 32.2 g/dL (ref 30.0–36.0)
MCV: 89.5 fL (ref 78.0–100.0)
Platelets: 343 10*3/uL (ref 150–400)
RBC: 2.57 MIL/uL — ABNORMAL LOW (ref 4.22–5.81)

## 2012-07-03 LAB — BASIC METABOLIC PANEL
CO2: 26 mEq/L (ref 19–32)
Calcium: 8.6 mg/dL (ref 8.4–10.5)
Creatinine, Ser: 2.52 mg/dL — ABNORMAL HIGH (ref 0.50–1.35)
GFR calc non Af Amer: 24 mL/min — ABNORMAL LOW (ref >90)
Sodium: 138 mEq/L (ref 135–145)

## 2012-07-03 MED ORDER — TRAMADOL HCL 50 MG PO TABS
50.0000 mg | ORAL_TABLET | Freq: Four times a day (QID) | ORAL | Status: DC | PRN
  Administered 2012-07-03 – 2012-07-08 (×8): 50 mg via ORAL
  Filled 2012-07-03 (×6): qty 1

## 2012-07-03 NOTE — Progress Notes (Signed)
[facility] PROGRESS NOTE  Dale Taylor ZOX:[id number] DOB: 25-Sep-1940 DOA: 06/30/2012 PCP: Provider Not In System  Assessment/Plan: Acute on chronic renal failure  - Suspect this is secondary to prerenal azotemia, medications (ACE inhibitor, Lasix and Aldactone) are the most likely culprits along with very poor oral intake. Patient  -transition IVF's to [facility] and encourage PO intake -follow electrolytes -continue holding nephrotoxic agents -CR improving (down to 2.5 , no HD anticipated at this time).  . Hyperkalemia  - From acute renal failure, lisinopril and Aldactone therapy.  - resolving; K down to 5.4 after kayexalate -will continue monitoring on telemetry -BMET in am  . Hypoglycemia  -This is due to acute renal failure and sulfonylurea use.  -will d/c Amaryl at discharge -now CBG's in 220 range will start SSI (sensitive, no meal coverage, no HS)  . Altered mental status  - Secondary to toxic metabolite encephalopathy from hypoglycemia and acute renal failure. -continue improving and pretty much back to baseline -will monitor.  . Anemia  - I suspect that this is a combination secondary to acute blood loss from  recent hip fracture and chronic renal failure. There is no overt evidence of blood loss. -will continue iron repletion as recommended by renal -will follow follow Hgb trend.  Marland Kitchen HTN (hypertension)  - Blood pressure stable now w/o meds -will monitor -will avoid ACE and aldactone  . Diabetes  -A1C 5.5 -will hold oral hypoglycemic agents -start SSI w/o meal or bedtime coverage -base on his A1C, no needs for outpatient hypoglycemic regimen at this point.  . Prostate cancer and urinary retention - History of prostate cancer status post radioactive seeding, [facility] with [facility]. [name] [facility]  - Will continue with Flomax and Ditropan   . Chronic diastolic heart failure  -History of chronic diastolic heart failure, currently clinically compensated.   -will continue monitoring I's and O's and start decreasing IVF's   . Recent hip fracture and pain all over - Will need SNf per PT rec's -will use PRN tramadol -LE's dopplers negative for DVT.  Marland Kitchen History of CML  - Is on chronic nilotinib therapy, follows up at the [facility] -will resume nilotinib (family to bring medication)  . Intravenous access Issues  -IJ discontinue on 6/20 -femoral line discontinue on 6/22 -2 peripheral IV's in place  Moderate protein calorie malnutrition and hypoalbuminemia: -will start Nepro BID -encourage PO intake  DVT Prophylaxis: SCD's   Code Status: Full Family Communication: no family at bedside Disposition Plan: to be determine  Consultants:  nephrology  Procedures:  See below for x-ray reports  Antibiotics:  none  HPI/Subjective: Patient complaining of pain al over, good urine output, no fever, no CP and no SOB.  Objective: Filed Vitals:   07/03/12 0819 07/03/12 0823 07/03/12 0930 07/03/12 1359  BP:   109/36 138/51  Pulse:   111 99  Temp:   99.5 F (37.5 C) 98.9 F (37.2 C)  TempSrc:      Resp:   18 18  Height:      Weight:      SpO2: 95% 96% 100% 96%    Intake/Output Summary (Last 24 hours) at 07/03/12 1529 Last data filed at 07/03/12 1359  Gross per 24 hour  Intake   1665 ml  Output   4375 ml  Net  -2710 ml   Filed Weights   07/01/12 0356 07/01/12 2038 07/02/12 2106  Weight: 83.7 kg (184 lb 8.4 oz) 83.7 kg (184 lb 8.4  oz) 83.7 kg (184 lb 8.4 oz)    Exam:   General:  AAOX2; afebrile, feeling better; complaining of pain all over  Cardiovascular: S1 and S2, no rubs or gallops  Respiratory: CTA bilaterally  Abdomen: soft, NT, ND, positive BS  Musculoskeletal: Trace edema, no cyanosis  Data Reviewed: Basic Metabolic Panel:  Recent Labs Lab 06/30/12 1705 06/30/12 1830 07/01/12 0040 07/01/12 0400 07/02/12 0535 07/03/12 0555  NA 129*  --  131* 133* 134* 138  K 6.4*  --  5.9* 5.4* 5.4*  5.7*  CL 95*  --  95* 97 98 105  CO2 21  --  26 28 25 26   GLUCOSE 102*  --  239* 221* 78 76  BUN 114*  --  113* 109* 87* 62*  CREATININE 6.97* 6.91* 6.10* 5.82* 3.81* 2.52*  CALCIUM 8.2*  --  8.3* 8.0* 8.3* 8.6  PHOS  --   --   --   --  4.5  --    Liver Function Tests:  Recent Labs Lab 06/30/12 1705 07/01/12 0400 07/02/12 0535  AST 25 19  --   ALT 19 15  --   ALKPHOS 61 52  --   BILITOT 0.4 0.4  --   PROT 5.8* 5.1*  --   ALBUMIN 2.6* 2.5* 2.6*   CBC:  Recent Labs Lab 06/30/12 1705 06/30/12 1830 07/01/12 0400 07/02/12 0535 07/03/12 0555  WBC 11.7* 11.4* 10.3 10.1 10.1  NEUTROABS  --   --  7.7 6.5  --   HGB 7.5* 7.1* 6.8* 7.8* 7.4*  HCT 21.4* 20.4* 19.4* 23.1* 23.0*  MCV 85.3 85.4 85.8 88.2 89.5  PLT 388 369 334 352 343   CBG:  Recent Labs Lab 07/02/12 0840 07/02/12 1211 07/02/12 1706 07/03/12 0722 07/03/12 1144  GLUCAP 108* 102* 111* 81 97    Recent Results (from the past 240 hour(s))  URINE CULTURE     Status: None   Collection Time    06/30/12  5:35 PM      Result Value Range Status   Specimen Description URINE, CATHETERIZED   Final   Special Requests NONE   Final   Culture  Setup Time 06/30/2012 18:38   Final   Colony Count NO GROWTH   Final   Culture NO GROWTH   Final   Report Status 07/01/2012 FINAL   Final  MRSA PCR SCREENING     Status: None   Collection Time    06/30/12  6:37 PM      Result Value Range Status   MRSA by PCR NEGATIVE  NEGATIVE Final   Comment:            The GeneXpert MRSA Assay (FDA     approved for NASAL specimens     only), is one component of a     comprehensive MRSA colonization     surveillance program. It is not     intended to diagnose MRSA     infection nor to guide or     monitor treatment for     MRSA infections.     Studies: No results found.  Scheduled Meds: . albuterol  2.5 mg Nebulization BID   And  . ipratropium  0.5 mg Nebulization BID  . aspirin  81 mg Oral Q48H  . clopidogrel  75 mg Oral  Daily  . famotidine  40 mg Oral Daily  . feeding supplement (NEPRO CARB STEADY)  237 mL Oral BID BM  . ferric gluconate (FERRLECIT/NULECIT)  IV  125 mg Intravenous Daily  . ferrous sulfate  325 mg Oral Q breakfast  . finasteride  5 mg Oral Daily  . insulin aspart  0-9 Units Subcutaneous TID WC  . lubiprostone  24 mcg Oral BID WC  . nilotinib  300 mg Oral Q12H  . oxybutynin  5 mg Oral QHS  . simvastatin  40 mg Oral QHS  . sodium chloride  3 mL Intravenous Q12H  . tamsulosin  0.4 mg Oral BID  . vitamin E  400 Units Oral BID   Continuous Infusions: . sodium chloride 75 mL/hr at 07/03/12 4098    Principal Problem:   Acute renal failure Active Problems:   Prostate cancer   Hyperkalemia   Anemia   Hypoglycemia   Altered mental status   HTN (hypertension)   Diabetes   Chronic diastolic heart failure   Intertrochanteric fracture of left hip    Time spent: >30 minutes   Vergil Burby  [facility] Pager [phone]. If 7PM-7AM, please contact night-coverage at [email or web address], password [facility] 07/03/2012, 3:29 PM  LOS: 3 days

## 2012-07-04 ENCOUNTER — Inpatient Hospital Stay (HOSPITAL_COMMUNITY): Payer: Medicare Other

## 2012-07-04 DIAGNOSIS — S72009D Fracture of unspecified part of neck of unspecified femur, subsequent encounter for closed fracture with routine healing: Secondary | ICD-10-CM

## 2012-07-04 DIAGNOSIS — R0602 Shortness of breath: Secondary | ICD-10-CM

## 2012-07-04 LAB — CBC
Hemoglobin: 7.9 g/dL — ABNORMAL LOW (ref 13.0–17.0)
MCH: 29.8 pg (ref 26.0–34.0)
MCHC: 33.3 g/dL (ref 30.0–36.0)
MCV: 89.4 fL (ref 78.0–100.0)
RBC: 2.65 MIL/uL — ABNORMAL LOW (ref 4.22–5.81)

## 2012-07-04 LAB — GLUCOSE, CAPILLARY
Glucose-Capillary: 109 mg/dL — ABNORMAL HIGH (ref 70–99)
Glucose-Capillary: 181 mg/dL — ABNORMAL HIGH (ref 70–99)
Glucose-Capillary: 105 mg/dL — ABNORMAL HIGH (ref 70–99)

## 2012-07-04 LAB — BASIC METABOLIC PANEL
BUN: 46 mg/dL — ABNORMAL HIGH (ref 6–23)
CO2: 25 mEq/L (ref 19–32)
Calcium: 8.8 mg/dL (ref 8.4–10.5)
Glucose, Bld: 79 mg/dL (ref 70–99)
Potassium: 5.8 mEq/L — ABNORMAL HIGH (ref 3.5–5.1)
Sodium: 138 mEq/L (ref 135–145)

## 2012-07-04 LAB — TROPONIN I: Troponin I: <0.3 ng/mL (ref <0.30)

## 2012-07-04 MED ORDER — NEPRO/CARBSTEADY PO LIQD
237.0000 mL | ORAL | Status: DC
  Administered 2012-07-05 – 2012-07-06 (×2): 237 mL via ORAL

## 2012-07-04 MED ORDER — FUROSEMIDE 20 MG PO TABS
20.0000 mg | ORAL_TABLET | Freq: Every day | ORAL | Status: DC
  Administered 2012-07-04 – 2012-07-10 (×7): 20 mg via ORAL
  Filled 2012-07-04 (×7): qty 1

## 2012-07-04 MED ORDER — GI COCKTAIL ~~LOC~~
30.0000 mL | Freq: Three times a day (TID) | ORAL | Status: DC | PRN
  Administered 2012-07-04 (×2): 30 mL via ORAL
  Filled 2012-07-04 (×2): qty 30

## 2012-07-04 MED ORDER — SUCRALFATE 1 GM/10ML PO SUSP
1.0000 g | Freq: Three times a day (TID) | ORAL | Status: DC
  Administered 2012-07-04 – 2012-07-10 (×23): 1 g via ORAL
  Filled 2012-07-04 (×27): qty 10

## 2012-07-04 MED ORDER — METOPROLOL TARTRATE 12.5 MG HALF TABLET
12.5000 mg | ORAL_TABLET | Freq: Two times a day (BID) | ORAL | Status: DC
  Administered 2012-07-04 (×2): 12.5 mg via ORAL
  Filled 2012-07-04 (×4): qty 1

## 2012-07-04 MED ORDER — MAGNESIUM OXIDE 400 (241.3 MG) MG PO TABS
400.0000 mg | ORAL_TABLET | Freq: Every day | ORAL | Status: DC
  Administered 2012-07-04 – 2012-07-10 (×7): 400 mg via ORAL
  Filled 2012-07-04 (×7): qty 1

## 2012-07-04 MED ORDER — BOOST / RESOURCE BREEZE PO LIQD
1.0000 | ORAL | Status: DC
  Administered 2012-07-04 – 2012-07-09 (×4): 1 via ORAL

## 2012-07-04 NOTE — Progress Notes (Signed)
[facility] PROGRESS NOTE  Dale Taylor NWG:[id number] DOB: November 26, 1940 DOA: 06/30/2012 PCP: Provider Not In System Brief summary 72 y.o. 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 During that admission his creatinine was approximately 4, he also had hyperkalemia during that admission. Unknown what his renal function was on discharge. He was subsequently discharged to a skilled nursing facility for rehabilitation. Patient is a very poor historian, he is not exactly aware why he was transferred to [facility] emergency room today. His daughter was at bedside, apparently for the past few days while at the skilled nursing facility, patient has had worsening of his renal failure and hyperkalemia. This morning he was found to have altered mental status, and low blood sugars. Per daughter, she thinks her blood sugars were as low as in the 40s. He was then brought to the emergency room at [facility], a CT of the head was done which was negative for acute abnormalities, he was however found to have acute renal failure with hyperkalemia. The ED M.D. placed a left internal jugular line, apparently per the chest x-ray report, tip is in the left subclavian vein, apparently a right femoral jugular triple-lumen catheter was also placed.  The patient and the daughter but denied any nausea vomiting or diarrhea. Patient does complain of generalized body aches, but denies any chest pain or shortness of breath. The patient does complain of very poor appetite over the past few weeks.  He is being admitted to [facility] for further evaluation and treatment  of his hyperkalemia and acute renal failure. Patient renal function now back to baseline; needs SNF for discharge; active chest pain on 07/04/12; most likely GI in nature but with risk factors. Will r/o any heart issues with EKG and CE'z cycling while finishing stabilising prior to discharge.  Assessment/Plan: Acute on chronic renal failure  - Suspect this is secondary to prerenal azotemia, medications (ACE inhibitor, Lasix and Aldactone) are the most likely culprits along with very poor oral intake. Patient  -IVF's change to NSL -follow electrolytes -continue indeterminate holding spironolactone and ACE inhibitorsagents -CR improving (down to 2.4 , no HD anticipated at this time). -will resume lasix  . Hyperkalemia  - From acute renal failure, lisinopril and Aldactone therapy.  - slight elevation to 5.8; most likely due to hemolysis -will continue tele, check BMET in am -restarted on lasix  . Diabetes with Hypoglycemia  -This is due to acute renal failure and sulfonylurea use.  -will d/c Amaryl at discharge -stable. Will continue SSI while inpatient -at discharge discontinue any hypoglycemic agents and give a trial control with diet.  . Altered mental status  - Secondary to toxic metabolite encephalopathy from hypoglycemia and acute renal failure. -continue improving and pretty much back to baseline -will monitor.  . Anemia  - I suspect that this is a combination secondary to acute blood loss from  recent hip fracture and chronic renal failure. There is no overt evidence of blood loss. -will continue iron repletion as recommended by renal -will follow follow Hgb trend, Last Hgb 7.9  . HTN (hypertension)  - Blood pressure rising -will restart low dose metoprolol, lasix and monitor -will avoid  ACE and aldactone  . Prostate cancer and urinary retention - History of prostate cancer status post radioactive seeding, follows with Dr. [name] at the cancer Center  - Will continue with  Flomax and Ditropan   . Chronic diastolic heart failure  -History of chronic diastolic heart failure, currently clinically compensated.  -will continue monitoring I's and O's and start decreasing IVF's   . Recent hip fracture and pain all over - Will need SNF per PT rec's -will use PRN tramadol -LE's dopplers negative for DVT.  Marland Kitchen History of CML  - Is on chronic nilotinib therapy, follows up at the [facility] -will resume nilotinib (family to bring medication)  . Intravenous access Issues  -IJ discontinue on 6/20 -femoral line discontinue on 6/22 -2 peripheral IV's in place  Moderate protein calorie malnutrition and hypoalbuminemia: -will start Nepro BID -encourage PO intake  DVT Prophylaxis: SCD's   Code Status: Full Family Communication: no family at bedside Disposition Plan: to be determine  Consultants:  nephrology  Procedures:  See below for x-ray reports  Antibiotics:  none  HPI/Subjective: Patient complaining of mid epigastric/CP discomfort; PO intake still poor. Also with some SOB.  Objective: Filed Vitals:   07/03/12 2145 07/04/12 0553 07/04/12 1000 07/04/12 1310  BP: 127/55 136/55 142/62 128/62  Pulse: 95 97 105 110  Temp: 98.7 F (37.1 C) 98.8 F (37.1 C) 98.7 F (37.1 C) 99.7 F (37.6 C)  TempSrc: Oral Oral Oral Oral  Resp: 18 18 18 22   Height: 5\' 8"  (1.727 m)     Weight: 83.7 kg (184 lb 8.4 oz)     SpO2: 96% 100% 100% 95%    Intake/Output Summary (Last 24 hours) at 07/04/12 1505 Last data filed at 07/04/12 1356  Gross per 24 hour  Intake 975.33 ml  Output   2350 ml  Net -1374.67 ml   Filed Weights   07/01/12 2038 07/02/12 2106 07/03/12 2145  Weight: 83.7 kg (184 lb 8.4 oz) 83.7 kg (184 lb 8.4 oz) 83.7 kg (184 lb 8.4 oz)    Exam:   General:  AAOX2; afebrile,complaining of CP and slight SOB  Cardiovascular: S1 and S2, no rubs or gallops  Respiratory: decrease BS at bases, no wheezing  Abdomen: soft, NT, ND,  positive BS  Musculoskeletal: Trace edema, no cyanosis  Data Reviewed: Basic Metabolic Panel:  Recent Labs Lab 07/01/12 0040 07/01/12 0400 07/02/12 0535 07/03/12 0555 07/04/12 0606  NA 131* 133* 134* 138 138  K 5.9* 5.4* 5.4* 5.7* 5.8*  CL 95* 97 98 105 104  CO2 26 28 25 26 25   GLUCOSE 239* 221* 78 76 79  BUN 113* 109* 87* 62* 46*  CREATININE 6.10* 5.82* 3.81* 2.52* 2.07*  CALCIUM 8.3* 8.0* 8.3* 8.6 8.8  MG  --   --   --   --  1.3*  PHOS  --   --  4.5  --   --    Liver Function Tests:  Recent Labs Lab 06/30/12 1705 07/01/12 0400 07/02/12 0535  AST 25 19  --   ALT 19 15  --   ALKPHOS 61 52  --   BILITOT 0.4 0.4  --   PROT 5.8* 5.1*  --   ALBUMIN 2.6* 2.5* 2.6*   CBC:  Recent Labs Lab 06/30/12 1830 07/01/12 0400 07/02/12 0535 07/03/12 0555 07/04/12 0606  WBC 11.4* 10.3 10.1 10.1 10.8*  NEUTROABS  --  7.7 6.5  --   --   HGB 7.1* 6.8*  7.8* 7.4* 7.9*  HCT 20.4* 19.4* 23.1* 23.0* 23.7*  MCV 85.4 85.8 88.2 89.5 89.4  PLT 369 334 352 343 336   CBG:  Recent Labs Lab 07/03/12 1658 07/03/12 2144 07/04/12 0808 07/04/12 0846 07/04/12 1154  GLUCAP 111* 138* 69* 109* 181*    Recent Results (from the past 240 hour(s))  URINE CULTURE     Status: None   Collection Time    06/30/12  5:35 PM      Result Value Range Status   Specimen Description URINE, CATHETERIZED   Final   Special Requests NONE   Final   Culture  Setup Time 06/30/2012 18:38   Final   Colony Count NO GROWTH   Final   Culture NO GROWTH   Final   Report Status 07/01/2012 FINAL   Final  MRSA PCR SCREENING     Status: None   Collection Time    06/30/12  6:37 PM      Result Value Range Status   MRSA by PCR NEGATIVE  NEGATIVE Final   Comment:            The GeneXpert MRSA Assay (FDA     approved for NASAL specimens     only), is one component of a     comprehensive MRSA colonization     surveillance program. It is not     intended to diagnose MRSA     infection nor to guide or      monitor treatment for     MRSA infections.     Studies: No results found.  Scheduled Meds: . albuterol  2.5 mg Nebulization BID   And  . ipratropium  0.5 mg Nebulization BID  . aspirin  81 mg Oral Q48H  . clopidogrel  75 mg Oral Daily  . famotidine  40 mg Oral Daily  . feeding supplement (NEPRO CARB STEADY)  237 mL Oral Q24H  . feeding supplement  1 Container Oral Q24H  . ferric gluconate (FERRLECIT/NULECIT) IV  125 mg Intravenous Daily  . ferrous sulfate  325 mg Oral Q breakfast  . finasteride  5 mg Oral Daily  . furosemide  20 mg Oral Daily  . insulin aspart  0-9 Units Subcutaneous TID WC  . lubiprostone  24 mcg Oral BID WC  . magnesium oxide  400 mg Oral Daily  . metoprolol  12.5 mg Oral BID  . nilotinib  300 mg Oral Q12H  . oxybutynin  5 mg Oral QHS  . simvastatin  40 mg Oral QHS  . sodium chloride  3 mL Intravenous Q12H  . sucralfate  1 g Oral TID WC & HS  . tamsulosin  0.4 mg Oral BID  . vitamin E  400 Units Oral BID   Continuous Infusions: . sodium chloride 10 mL/hr at 07/03/12 1528    Principal Problem:   Acute renal failure Active Problems:   Prostate cancer   Hyperkalemia   Anemia   Hypoglycemia   Altered mental status   HTN (hypertension)   Diabetes   Chronic diastolic heart failure   Intertrochanteric fracture of left hip    Time spent: >30 minutes   Merilyn Pagan  [facility] Pager [phone]. If 7PM-7AM, please contact night-coverage at [email or web address], password [facility] 07/04/2012, 3:05 PM  LOS: 4 days

## 2012-07-04 NOTE — Progress Notes (Signed)
CBG:69 Treatment: 15 GM carbohydrate snack  Symptoms: None  Follow-up CBG: Time: 846 CBG Result: 108  Possible Reasons for Event: Unknown  Comments/MD notified:

## 2012-07-04 NOTE — Progress Notes (Signed)
Physical Therapy Treatment Patient Details Name: Dale Taylor MRN: [id number] DOB: [birth year] Today's Date: 07/04/2012 Time: 1191-4782 PT Time Calculation (min): 23 min  PT Assessment / Plan / Recommendation Comments on Treatment Session  Pt more alert today. Does state he was walking with assistance at the rehab center, however unable to recall with what device/if any. Also unable to recall the ortho surgeon who performed his hip surgery. States he was at the rehab for 14 days before coming here. Does recall that he is in the hospital in [place], [place], not the name of the hospital. Pt's family (sister's x2 and brother in law?) arrived at end of session and were unable to provide any information on pt's level of mobility before admission here. Have called and left a message with the SNF to call primay PT back with prior status/wt bearing information. Pt progressing toward goals with incr'd activity/less assistance today.                         Follow Up Recommendations  SNF           Equipment Recommendations  Other (comment) (tbd)       Frequency Min 3X/week   Plan Discharge plan remains appropriate;Frequency remains appropriate    Precautions / Restrictions Precautions Precautions: Fall Restrictions Weight Bearing Restrictions: Yes LLE Weight Bearing:  (left message today with SNF asking for clarification)       Mobility  Bed Mobility Supine to Sit: 4: Min assist;With rails;HOB elevated Supine to Sit: Patient Percentage: 80% Sitting - Scoot to Edge of Bed: 4: Min assist;With rail Details for Bed Mobility Assistance: cues for technique, increased time required with mobility and redirection to task needed. Transfers Transfers: Editor, commissioning Transfers: 1: +2 Total assist;From elevated surface Stand Pivot Transfers: Patient Percentage: 60% Details for Transfer Assistance: 2 person hand held assist for transfer bed to chair. Pt taking 4-5 steps with incr time  and placing less than 25% wt on left leg.  Ambulation/Gait Ambulation/Gait Assistance: Not tested (comment)       Visit Information  Last PT Received On: 07/04/12 Assistance Needed: +2    Subjective Data  Subjective: Agreeable to OOB with therapy today. Reports he was walking with assistance at the rehab center. Unable to state with or with out a device or recall wt bearing. "I put about half I think"   Cognition  Cognition DO NOT USE:  Arousal/Alertness: Awake/alert Behavior During Therapy: WFL for tasks assessed/performed Overall Cognitive Status: Within Functional Limits for tasks assessed       End of Session PT - End of Session Equipment Utilized During Treatment: Gait belt Activity Tolerance: Patient tolerated treatment well Patient left: in bed;with call bell/phone within reach;with family/visitor present Nurse Communication: Mobility status   GP     Sallyanne Kuster 07/04/2012, 4:11 PM  Sallyanne Kuster, PTA Office- [phone]

## 2012-07-04 NOTE — Progress Notes (Signed)
INITIAL NUTRITION ASSESSMENT  DOCUMENTATION CODES  Per approved criteria  -Severe malnutrition in the context of chronic illness   INTERVENTION: 1. Decrease Nepro Shake to daily. 2. Add Resource Breeze po daily, each supplement provides 250 kcal and 9 grams of protein. 3. RD to continue to follow nutrition care plan.  NUTRITION DIAGNOSIS: Inadequate oral intake related to ARF as evidenced by dietary recall and ongoing weight loss.   Goal: Intake to meet >90% of estimated nutrition needs.  Monitor:  weight trends, lab trends, I/O's, PO intake, supplement tolerance  Reason for Assessment: Malnutrition Screening  72 y.o. male  Admitting Dx: Acute renal failure  ASSESSMENT: PMHx of CML, stage II-III CKD, hx of prostate CA.  Complains of very poor appetite over the past few weeks. Work-up reveals ARF.  Started on Nepro PO BID. Currently on Dysphagia 3 diet with thin liquids, eating 10% of meals. Pt reports that he has had poor appetite for at least 2 months. He tells me that he started eating less when he was told to start avoiding salt. Once he started eating less, he developed poor appetite, per his report.  Pt with mild-moderate temporal wasting. He confirms weight loss but it is unable to tell me how much he usually weighs, per chart review, pt with 19% wt loss x 1 year. Pt meets criteria for moderate MALNUTRITION in the context of chronic illness as evidenced by 19% wt loss x 1 year, intake of <75% x at least 1 month and mild-moderate muscle mass loss.  Height: Ht Readings from Last 1 Encounters:  07/03/12 5\' 8"  (1.727 m)    Weight: Wt Readings from Last 1 Encounters:  07/03/12 184 lb 8.4 oz (83.7 kg)    Ideal Body Weight: 154 lb  % Ideal Body Weight: 119%  Wt Readings from Last 10 Encounters:  07/03/12 184 lb 8.4 oz (83.7 kg)  08/10/11 225 lb 11.2 oz (102.377 kg)  05/14/11 226 lb (102.513 kg)  05/14/11 226 lb (102.513 kg)  05/11/11 226 lb (102.513 kg)  02/25/11  220 lb 12.8 oz (100.154 kg)  11/19/10 229 lb 14.4 oz (104.282 kg)    Usual Body Weight: 225 lb  % Usual Body Weight: 81%; 19% wt loss x 1 year  BMI:  Body mass index is 28.06 kg/(m^2). Overweight.  Estimated Nutritional Needs: Kcal: 1800 - 2000 Protein: 66 - 83 g Fluid: 1.8 - 2 liters  Skin: intact  Diet Order: Dysphagia 3; thin liquids  EDUCATION NEEDS: -No education needs identified at this time   Intake/Output Summary (Last 24 hours) at 07/04/12 0905 Last data filed at 07/04/12 0553  Gross per 24 hour  Intake 1255.33 ml  Output   3150 ml  Net -1894.67 ml    Last BM: 6/23  Labs:   Recent Labs Lab 07/01/12 0400 07/02/12 0535 07/03/12 0555 07/04/12 0606  NA 133* 134* 138 138  K 5.4* 5.4* 5.7* 5.8*  CL 97 98 105 104  CO2 28 25 26 25   BUN 109* 87* 62* 46*  CREATININE 5.82* 3.81* 2.52* 2.07*  CALCIUM 8.0* 8.3* 8.6 8.8  MG  --   --   --  1.3*  PHOS  --  4.5  --   --   GLUCOSE 221* 78 76 79    CBG (last 3)   Recent Labs  07/03/12 1144 07/03/12 1658 07/03/12 2144  GLUCAP 97 111* 138*    Scheduled Meds: . albuterol  2.5 mg Nebulization BID   And  .  ipratropium  0.5 mg Nebulization BID  . aspirin  81 mg Oral Q48H  . clopidogrel  75 mg Oral Daily  . famotidine  40 mg Oral Daily  . feeding supplement (NEPRO CARB STEADY)  237 mL Oral BID BM  . ferric gluconate (FERRLECIT/NULECIT) IV  125 mg Intravenous Daily  . ferrous sulfate  325 mg Oral Q breakfast  . finasteride  5 mg Oral Daily  . furosemide  20 mg Oral Daily  . insulin aspart  0-9 Units Subcutaneous TID WC  . lubiprostone  24 mcg Oral BID WC  . magnesium oxide  400 mg Oral Daily  . nilotinib  300 mg Oral Q12H  . oxybutynin  5 mg Oral QHS  . simvastatin  40 mg Oral QHS  . sodium chloride  3 mL Intravenous Q12H  . tamsulosin  0.4 mg Oral BID  . vitamin E  400 Units Oral BID    Continuous Infusions: . sodium chloride 10 mL/hr at 07/03/12 1528    Past Medical History  Diagnosis Date   . Hypertension   . Hypercholesterolemia   . GERD (gastroesophageal reflux disease)   . Dyspnea on exertion   . Claudication in peripheral vascular disease   . PVD (peripheral vascular disease)   . S/P renal artery angioplasty 2009--- W/ STENT X1  TO LEFT COMMON ILIAC    . ED (erectile dysfunction)   . History of BPH   . Frequency of urination   . Urgency of urination   . Nocturia   . Chronic gouty arthritis FEET  . Diabetes mellitus     type II-- ORAL MED  . Prostate cancer SCHEDULED FOR RADIOACTIVE SEED IMPLANTS OF PROSTATE  05-18-2011    FOLLOWED BY DR [facility] AND DR [name]  . CML (chronic myelocytic leukemia) FOLLOWED BY DR LEWIS ([facility])    TAKES SPRYCEL PO MED FOR TX--  PT STATES STABLE  NO SYMPTOMS  . Iron deficiency anemia   . Chronic renal insufficiency   . PAD (peripheral artery disease)   . Cardiovascular disease with arteriosclerosis   . Blood transfusion LAST TIME NOV 2012  --  X2  UNITS  (FOR ANEMIA AND THROMBOCYTOPENIA  . Leukocytosis   . Peripheral edema   . History of thrombocytopenia   . Heart murmur   . CHF (congestive heart failure) ADMISSION NOV 2012  [facility])  REQUESTED RECORDS    DIATOSLIC CHF WITH LEFT VENTRICULAR IMPAIRED RELAXATION-----   CARDIOLOGIST----  DR Jaynie Bream  . Hypertension   . Arthritis   . History of angina   . Acute renal failure Admitted to [facility] 06/06/12 with hip fracture; creatinine 4.3, K 7.8. Aldactone and lisinopril stopped    Past Surgical History  Procedure Laterality Date  . Lung lobectomy  AGE 45 (APPROX.)    RIGHT LOWER LOBECTOMY 20 YEARS AGO, NOT CANCER (PROBLEM HEMORRHAGE)  . Hemorrhoid surgery    . Transthoracic echocardiogram  12-05-2010  [facility])    LEFT VENTRICULAR HYPERTROPHY / PRESERVED SYSTOLIC FUNCTION/ EF 60-65%/  IMPAIRED LEFT VENTRICULAR RELAXATION WITH LEFT ATRIAL ENLARGEMENT/ MILD MITRAL AND TRICUSPID REGURG.  . Cataract extraction w/ intraocular lens  implant, bilateral   2012  . Aortogram/ angioplasty and stenting of left common iliac artery  04-07-2007   DR FIELDS    LEFT COMMON ILIAC STENOSIS 80%, STENTED/ BILATERAL OCCLUSION ANTERIOR TIBIAL ARTERY/ BILATERAL INCOMPLETE PLANTAR ARCH FILLED BY PERONEAL AND POSTERIOR TIBIAL ARTERY/ DIFFUSE BUT PATENT SUPERFICIIAL FEMORAL ARTERIES BILATERLLY  . Cardiovascular  stress test  05-19-2011  DR ROBERT KRASOWSKI    NO EVIDENCE OF ISCHEMIA/ NORMAL LVSF/ EF 55%  . Radioactive seed implant  07/21/2011    Procedure: RADIOACTIVE SEED IMPLANT;  Surgeon: Debroah Baller, MD;  Location: [facility];  Service: Urology;  Laterality: N/A;    72   seeds implanted  . Cystoscopy  07/21/2011    Procedure: CYSTOSCOPY;  Surgeon: Debroah Baller, MD;  Location: [facility];  Service: Urology;  Laterality: N/A;  no seeds found in bladder    Jarold Motto MS, RD, LDN Pager: [phone] After-hours pager: [phone]

## 2012-07-04 NOTE — Progress Notes (Signed)
CRITICAL VALUE ALERT  Critical value received:  Troponin 0.47 Date of notification: 07/04/12  Time of notification:  2325 Critical value read back:yes  Nurse who received alert:  Theresa Mulligan, Rn  MD notified (1st page):  Kirtland Bouchard Schorr  Time of first page:  2331 MD notified (2nd page):  Time of second page:  Responding MD: Merdis Delay  Time MD responded: 1610

## 2012-07-05 DIAGNOSIS — I4891 Unspecified atrial fibrillation: Secondary | ICD-10-CM

## 2012-07-05 DIAGNOSIS — R079 Chest pain, unspecified: Secondary | ICD-10-CM

## 2012-07-05 LAB — CBC
HCT: 24.5 % — ABNORMAL LOW (ref 39.0–52.0)
MCH: 29.7 pg (ref 26.0–34.0)
MCHC: 33.5 g/dL (ref 30.0–36.0)
MCV: 88.8 fL (ref 78.0–100.0)
Platelets: 296 10*3/uL (ref 150–400)
RDW: 14.2 % (ref 11.5–15.5)
WBC: 16.2 10*3/uL — ABNORMAL HIGH (ref 4.0–10.5)

## 2012-07-05 LAB — BASIC METABOLIC PANEL
BUN: 36 mg/dL — ABNORMAL HIGH (ref 6–23)
CO2: 24 mEq/L (ref 19–32)
Calcium: 8.9 mg/dL (ref 8.4–10.5)
Creatinine, Ser: 1.73 mg/dL — ABNORMAL HIGH (ref 0.50–1.35)
Glucose, Bld: 101 mg/dL — ABNORMAL HIGH (ref 70–99)

## 2012-07-05 LAB — GLUCOSE, CAPILLARY
Glucose-Capillary: 174 mg/dL — ABNORMAL HIGH (ref 70–99)
Glucose-Capillary: 110 mg/dL — ABNORMAL HIGH (ref 70–99)
Glucose-Capillary: 136 mg/dL — ABNORMAL HIGH (ref 70–99)

## 2012-07-05 MED ORDER — NITROGLYCERIN 0.4 MG SL SUBL
SUBLINGUAL_TABLET | SUBLINGUAL | Status: AC
  Filled 2012-07-05: qty 25

## 2012-07-05 MED ORDER — METOPROLOL TARTRATE 25 MG PO TABS
25.0000 mg | ORAL_TABLET | Freq: Two times a day (BID) | ORAL | Status: DC
  Administered 2012-07-05 – 2012-07-06 (×3): 25 mg via ORAL
  Filled 2012-07-05 (×4): qty 1

## 2012-07-05 MED ORDER — METOPROLOL TARTRATE 1 MG/ML IV SOLN
5.0000 mg | INTRAVENOUS | Status: AC
  Administered 2012-07-05: 5 mg via INTRAVENOUS
  Filled 2012-07-05: qty 5

## 2012-07-05 MED ORDER — SODIUM POLYSTYRENE SULFONATE 15 GM/60ML PO SUSP
45.0000 g | Freq: Once | ORAL | Status: AC
  Administered 2012-07-05: 45 g via ORAL
  Filled 2012-07-05: qty 180

## 2012-07-05 MED ORDER — WHITE PETROLATUM GEL
Status: AC
  Administered 2012-07-05: 0.2
  Filled 2012-07-05: qty 5

## 2012-07-05 NOTE — Progress Notes (Signed)
Pt Hr increased to 160s-170s  Sustaining. Pt also was cc of sharp chest pain. Rapid response and MD notified at the time. EKG complete showing A fib with RVR.   Rapid at bedside,  pt receiving bolus order. Will continue to monitor

## 2012-07-05 NOTE — Consult Note (Signed)
Reason for Consult: Atrial Fibrillation with RVR Referring Physician: TRH  NOTE: The patient appeared very drowsy during encounter and is a very poor historian. Thus the history is very limited. The majority of the history for this HPI was obtained by chart review.   HPI: The patient is a 72 y/o AA male, with a medical history significant for chronic diastolic heart failure, HTN, DM, CKD, CML, prostate cancer and a recent hip fracture, s/p surgical repair roughly 3 weeks ago,  who was directly transferred from [facility] to [facility] on 6/20 for treatment of acute on chronic kidney failure and hyperkalemia. When he first presented, he apparently had altered mental status. He has been under the care of [facility]. [facility] has been consulted for new onset of atrial fibrillation with RVR, that first occurred earlier this morning. He has seen a cardiologist remotely, but this was in [place]. He does not recall the physician's name. Apparently, the patient had ventricular rates in the 160's. Chart review reveals that the patient had endorsed chest pain earlier today, however, during encounter, the patient said that he didn't have pain. He denied SOB, lightheadedness or dizziness. The patient was administered 5 mg of IV metoprolol and converted to NSR.   Past Medical History  Diagnosis Date  . Hypertension   . Hypercholesterolemia   . GERD (gastroesophageal reflux disease)   . Dyspnea on exertion   . Claudication in peripheral vascular disease   . PVD (peripheral vascular disease)   . S/P renal artery angioplasty 2009--- W/ STENT X1  TO LEFT COMMON ILIAC    . ED (erectile dysfunction)   . History of BPH   . Frequency of urination   . Urgency of urination   . Nocturia   . Chronic gouty arthritis FEET  . Diabetes mellitus     type II-- [facility]AL MED  . Prostate cancer SCHEDULED FOR RADIOACTIVE SEED IMPLANTS OF PROSTATE  05-18-2011    FOLLOWED BY DR [facility] AND DR [name]  . CML (chronic  myelocytic leukemia) FOLLOWED BY DR LEWIS ([facility])    TAKES SPRYCEL PO MED FOR TX--  PT STATES STABLE  NO SYMPTOMS  . Iron deficiency anemia   . Chronic renal insufficiency   . PAD (peripheral artery disease)   . Cardiovascular disease with arteriosclerosis   . Blood transfusion LAST TIME NOV 2012  --  X2  UNITS  (FOR ANEMIA AND THROMBOCYTOPENIA  . Leukocytosis   . Peripheral edema   . History of thrombocytopenia   . Heart murmur   . CHF (congestive heart failure) ADMISSION NOV 2012  [facility])  REQUESTED RECORDS    DIATOSLIC CHF WITH LEFT VENTRICULAR IMPAIRED RELAXATION-----   CARDIOLOGIST----  DR Jaynie Bream  . Hypertension   . Arthritis   . History of angina   . Acute renal failure Admitted to [facility] 06/06/12 with hip fracture; creatinine 4.3, K 7.8. Aldactone and lisinopril stopped    Past Surgical History  Procedure Laterality Date  . Lung lobectomy  AGE 4 (APPROX.)    RIGHT LOWER LOBECTOMY 20 YEARS AGO, NOT CANCER (PROBLEM HEMORRHAGE)  . Hemorrhoid surgery    . Transthoracic echocardiogram  12-05-2010  [facility])    LEFT VENTRICULAR HYPERTROPHY / PRESERVED SYSTOLIC FUNCTION/ EF 60-65%/  IMPAIRED LEFT VENTRICULAR RELAXATION WITH LEFT ATRIAL ENLARGEMENT/ MILD MITRAL AND TRICUSPID REGURG.  . Cataract extraction w/ intraocular lens  implant, bilateral  2012  . Aortogram/ angioplasty and stenting of left common iliac artery  04-07-2007  DR FIELDS    LEFT COMMON ILIAC STENOSIS 80%, STENTED/ BILATERAL OCCLUSION ANTERIOR TIBIAL ARTERY/ BILATERAL INCOMPLETE PLANTAR ARCH FILLED BY PERONEAL AND POSTERIOR TIBIAL ARTERY/ DIFFUSE BUT PATENT SUPERFICIIAL FEMORAL ARTERIES BILATERLLY  . Cardiovascular stress test  05-19-2011  DR ROBERT KRASOWSKI    NO EVIDENCE OF ISCHEMIA/ NORMAL LVSF/ EF 55%  . Radioactive seed implant  07/21/2011    Procedure: RADIOACTIVE SEED IMPLANT;  Surgeon: Debroah Baller, MD;  Location: [facility];  Service: Urology;   Laterality: N/A;    72   seeds implanted  . Cystoscopy  07/21/2011    Procedure: CYSTOSCOPY;  Surgeon: Debroah Baller, MD;  Location: [facility];  Service: Urology;  Laterality: N/A;  no seeds found in bladder    Family History  Problem Relation Age of Onset  . Cancer Maternal Aunt     UNKNOWN CANCER DIED AGE 72    Social History:  reports that he quit smoking about 33 years ago. His smoking use included Cigarettes. He has a 30 pack-year smoking history. He has never used smokeless tobacco. He reports that he does not drink alcohol or use illicit drugs.  Allergies: No Known Allergies  Medications:  Prior to Admission:  Prescriptions prior to admission  Medication Sig Dispense Refill  . allopurinol (ZYLOPRIM) 300 MG tablet Take 300 mg by mouth daily.      Marland Kitchen amLODipine (NORVASC) 5 MG tablet Take 5 mg by mouth daily.      Marland Kitchen aspirin 81 MG chewable tablet Chew 81 mg by mouth every other day.      . clopidogrel (PLAVIX) 75 MG tablet Take 75 mg by mouth daily.      . ferrous sulfate 325 (65 FE) MG tablet Take 325 mg by mouth daily with breakfast.      . finasteride (PROSCAR) 5 MG tablet Take 5 mg by mouth daily.      . furosemide (LASIX) 20 MG tablet Take 40-60 mg by mouth 2 (two) times daily. Take 60 mg in the morning and 40 mg in the evening      . glimepiride (AMARYL) 4 MG tablet Take 4 mg by mouth daily before breakfast.       . hydrALAZINE (APRESOLINE) 10 MG tablet Take 10 mg by mouth 3 (three) times daily.      Marland Kitchen ipratropium-albuterol (DUONEB) 0.5-2.5 (3) MG/3ML SOLN Take 3 mLs by nebulization every 6 (six) hours as needed (wheezing).      . isosorbide mononitrate (IMDUR) 30 MG 24 hr tablet Take 30 mg by mouth every morning.       Marland Kitchen lisinopril (PRINIVIL,ZESTRIL) 5 MG tablet Take 5 mg by mouth daily.      Marland Kitchen lubiprostone (AMITIZA) 24 MCG capsule Take 24 mcg by mouth 2 (two) times daily with a meal.      . meclizine (ANTIVERT) 25 MG tablet Take 25 mg by mouth 3 (three) times  daily as needed for dizziness.      . metoprolol (LOPRESSOR) 50 MG tablet Take 50 mg by mouth 2 (two) times daily.      . nilotinib (TASIGNA) 150 MG capsule Take 300 mg by mouth every 12 (twelve) hours.      Marland Kitchen omega-3 acid ethyl esters (LOVAZA) 1 G capsule Take 1 g by mouth 2 (two) times daily.      Marland Kitchen oxybutynin (DITROPAN-XL) 5 MG 24 hr tablet Take 5 mg by mouth at bedtime.      . simvastatin (ZOCOR) 40 MG  tablet Take 40 mg by mouth at bedtime.      Marland Kitchen spironolactone (ALDACTONE) 25 MG tablet Take 25 mg by mouth daily.      . Tamsulosin HCl (FLOMAX) 0.4 MG CAPS Take 0.4 mg by mouth 2 (two) times daily.       . vitamin E 400 UNIT capsule Take 400 Units by mouth 2 (two) times daily.        Results for orders placed during the hospital encounter of 06/30/12 (from the past 48 hour(s))  GLUCOSE, CAPILLARY     Status: Abnormal   Collection Time    07/03/12  4:58 PM      Result Value Range   Glucose-Capillary 111 (*) 70 - 99 mg/dL  GLUCOSE, CAPILLARY     Status: Abnormal   Collection Time    07/03/12  9:44 PM      Result Value Range   Glucose-Capillary 138 (*) 70 - 99 mg/dL  MAGNESIUM     Status: Abnormal   Collection Time    07/04/12  6:06 AM      Result Value Range   Magnesium 1.3 (*) 1.5 - 2.5 mg/dL  BASIC METABOLIC PANEL     Status: Abnormal   Collection Time    07/04/12  6:06 AM      Result Value Range   Sodium 138  135 - 145 mEq/L   Potassium 5.8 (*) 3.5 - 5.1 mEq/L   Chloride 104  96 - 112 mEq/L   CO2 25  19 - 32 mEq/L   Glucose, Bld 79  70 - 99 mg/dL   BUN 46 (*) 6 - 23 mg/dL   Creatinine, Ser 1.61 (*) 0.50 - 1.35 mg/dL   Calcium 8.8  8.4 - 09.6 mg/dL   GFR calc non Af Amer 31 (*) >90 mL/min   GFR calc Af Amer 35 (*) >90 mL/min   Comment:            The eGFR has been calculated     using the CKD EPI equation.     This calculation has not been     validated in all clinical     situations.     eGFR's persistently     <90 mL/min signify     possible Chronic Kidney  Disease.  CBC     Status: Abnormal   Collection Time    07/04/12  6:06 AM      Result Value Range   WBC 10.8 (*) 4.0 - 10.5 K/uL   RBC 2.65 (*) 4.22 - 5.81 MIL/uL   Hemoglobin 7.9 (*) 13.0 - 17.0 g/dL   HCT 04.5 (*) 40.9 - 81.1 %   MCV 89.4  78.0 - 100.0 fL   MCH 29.8  26.0 - 34.0 pg   MCHC 33.3  30.0 - 36.0 g/dL   RDW 91.4  78.2 - 95.6 %   Platelets 336  150 - 400 K/uL  GLUCOSE, CAPILLARY     Status: Abnormal   Collection Time    07/04/12  8:08 AM      Result Value Range   Glucose-Capillary 69 (*) 70 - 99 mg/dL   Comment 1 Documented in Chart    GLUCOSE, CAPILLARY     Status: Abnormal   Collection Time    07/04/12  8:46 AM      Result Value Range   Glucose-Capillary 109 (*) 70 - 99 mg/dL   Comment 1 Documented in Chart    GLUCOSE, CAPILLARY  Status: Abnormal   Collection Time    07/04/12 11:54 AM      Result Value Range   Glucose-Capillary 181 (*) 70 - 99 mg/dL   Comment 1 Documented in Chart    TROPONIN I     Status: None   Collection Time    07/04/12  3:38 PM      Result Value Range   Troponin I <0.30  <0.30 ng/mL   Comment:            Due to the release kinetics of cTnI,     a negative result within the first hours     of the onset of symptoms does not rule out     myocardial infarction with certainty.     If myocardial infarction is still suspected,     repeat the test at appropriate intervals.  GLUCOSE, CAPILLARY     Status: Abnormal   Collection Time    07/04/12  4:35 PM      Result Value Range   Glucose-Capillary 105 (*) 70 - 99 mg/dL  GLUCOSE, CAPILLARY     Status: Abnormal   Collection Time    07/04/12  9:27 PM      Result Value Range   Glucose-Capillary 132 (*) 70 - 99 mg/dL   Comment 1 Notify RN    TROPONIN I     Status: Abnormal   Collection Time    07/04/12  9:48 PM      Result Value Range   Troponin I 0.47 (*) <0.30 ng/mL   Comment:            Due to the release kinetics of cTnI,     a negative result within the first hours     of the  onset of symptoms does not rule out     myocardial infarction with certainty.     If myocardial infarction is still suspected,     repeat the test at appropriate intervals.     CRITICAL RESULT CALLED TO, READ BACK BY AND VERIFIED WITH:     BEASON,D RN 07/04/2012 2325 JORDANS  TROPONIN I     Status: Abnormal   Collection Time    07/05/12  2:34 AM      Result Value Range   Troponin I 0.59 (*) <0.30 ng/mL   Comment:            Due to the release kinetics of cTnI,     a negative result within the first hours     of the onset of symptoms does not rule out     myocardial infarction with certainty.     If myocardial infarction is still suspected,     repeat the test at appropriate intervals.     CRITICAL VALUE NOTED.  VALUE IS CONSISTENT WITH PREVIOUSLY REPORTED AND CALLED VALUE.  BASIC METABOLIC PANEL     Status: Abnormal   Collection Time    07/05/12  2:35 AM      Result Value Range   Sodium 135  135 - 145 mEq/L   Potassium 6.4 (*) 3.5 - 5.1 mEq/L   Comment: DELTA CHECK NOTED     NO VISIBLE HEMOLYSIS     CRITICAL RESULT CALLED TO, READ BACK BY AND VERIFIED WITH:     BEASON,D RN 07/05/2012 0350 JORDANS   Chloride 103  96 - 112 mEq/L   CO2 24  19 - 32 mEq/L   Glucose, Bld 101 (*) 70 - 99 mg/dL  BUN 36 (*) 6 - 23 mg/dL   Creatinine, Ser 1.32 (*) 0.50 - 1.35 mg/dL   Calcium 8.9  8.4 - 44.0 mg/dL   GFR calc non Af Amer 38 (*) >90 mL/min   GFR calc Af Amer 44 (*) >90 mL/min   Comment:            The eGFR has been calculated     using the CKD EPI equation.     This calculation has not been     validated in all clinical     situations.     eGFR's persistently     <90 mL/min signify     possible Chronic Kidney Disease.  CBC     Status: Abnormal   Collection Time    07/05/12  2:35 AM      Result Value Range   WBC 16.2 (*) 4.0 - 10.5 K/uL   RBC 2.76 (*) 4.22 - 5.81 MIL/uL   Hemoglobin 8.2 (*) 13.0 - 17.0 g/dL   HCT 10.2 (*) 72.5 - 36.6 %   MCV 88.8  78.0 - 100.0 fL   MCH 29.7   26.0 - 34.0 pg   MCHC 33.5  30.0 - 36.0 g/dL   RDW 44.0  34.7 - 42.5 %   Platelets 296  150 - 400 K/uL  GLUCOSE, CAPILLARY     Status: Abnormal   Collection Time    07/05/12  7:28 AM      Result Value Range   Glucose-Capillary 142 (*) 70 - 99 mg/dL  GLUCOSE, CAPILLARY     Status: Abnormal   Collection Time    07/05/12 11:32 AM      Result Value Range   Glucose-Capillary 174 (*) 70 - 99 mg/dL    Dg Chest 2 View  9/56/3875   *RADIOLOGY REPORT*  Clinical Data: Shortness of breath.  CHEST - 2 VIEW  Comparison: 06/30/2012  Findings: Elevation of the right hemidiaphragm, stable.  Scarring or atelectasis in the right base.  Postoperative changes on the right.  Mild cardiomegaly and vascular congestion.  No confluent opacity on the left.  No visible effusions.  Increasing interstitial prominence of the lungs could reflect interstitial edema.  IMPRESSION: Postoperative changes on the right with chronic elevation of the right hemidiaphragm.  Question mild interstitial edema.   Original Report Authenticated By: Charlett Nose, M.D.    Review of Systems  Unable to perform ROS: mental status change   Blood pressure 118/51, pulse 110, temperature 97.8 F (36.6 C), temperature source Oral, resp. rate 20, height 5\' 8"  (1.727 m), weight 177 lb 4 oz (80.4 kg), SpO2 100.00%. Physical Exam  Constitutional: He appears well-developed and well-nourished. No distress.  HENT:  Head: Normocephalic and atraumatic.  Neck: No JVD present.  Cardiovascular: Normal rate, regular rhythm, normal heart sounds and intact distal pulses.  Exam reveals no gallop and no friction rub.   No murmur heard. Respiratory: Effort normal and breath sounds normal. No respiratory distress. He has no wheezes. He has no rales.  GI: Soft. Bowel sounds are normal.  Musculoskeletal: He exhibits no edema.  Skin: Skin is warm and dry. He is not diaphoretic.  Psychiatric: He is slowed and withdrawn. He is inattentive.     Assessment/Plan: Principal Problem:   Acute renal failure Active Problems:   Prostate cancer   Hyperkalemia   Anemia   Hypoglycemia   Altered mental status   HTN (hypertension)   Diabetes   Chronic diastolic heart failure  Intertrochanteric fracture of left hip   Atrial fibrillation with RVR  Plan: Pt is now in NSR. He easily converted after only one dose of 5 mg IV metoprolol. He has had some recurrent sinus tachycardia. His dose of PO Lopressor was increased today by TRH from 12.5 mg BID to 25 mg BID. His HR is currently in the mid 90s. His HR is stable. Continue on BB and increase if patient continues to have tachycardia. If a-fib with RVR recurs, and rate control is not achieved with IV Lopressor, may need to consider IV diltiazem for rate control. It is unsure if this is his fist occurrence of atrial fibrillation. This may be secondary to acute illness. May also consider checking TSH. Pt also had positive troponins x 2 (0.47 and 0.59). ? If this is related to a-fib/ extreme tachycardia. A clear history is difficult to obtain from patient, so it is unclear if he ever has angina. There are no acute changes on EKGs, but there does appear to be q waves. Records here at [facility] show that pt has a history of diastolic heart failure. Unsure of coronary history. Pt has received cardiovascular care in the past in [place]. In patient's chart, there is documentation of the results of a Lexiscan NST from 05/18/2011, done a [facility]. There apparently was no ischemia, only soft tissue attenuation. His EF was 55%. Will attempt to obtain records from [facility] to help guide further treatment.   Allayne Butcher, PA-C 07/05/2012, 12:38 PM    Patient seen and examined. Agree with assessment and plan. 72 yo AAM with h/o HTN, DM, CML, prostate Ca, progressive renal insufficiency, anemia with recent hyperkalemia and hypoglycemia who went into AF with RVR today. Pt has since converted to  ST following 5 mg IV Lopressor. He denies any chest pain. A prior nuclear study 1 year ago was nonischemic. Will re check 2d echo. Troponins are minimally positive. ECG did show rate related ST-T changes. Now on increased lopressor and maintaining sinus rhythm. Would change amlodipine to diltiazem for additional rate control. Will follow.   Lennette Bihari, MD, [facility] 07/05/2012 3:41 PM

## 2012-07-05 NOTE — Progress Notes (Signed)
[facility] PROGRESS NOTE  Dale Taylor WGN:[id number] DOB: 1940/02/21 DOA: 06/30/2012 PCP: Provider Not In System Brief summary 72 y.o. 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 During that admission his creatinine was approximately 4, he also had hyperkalemia during that admission. Unknown what his renal function was on discharge. He was subsequently discharged to a skilled nursing facility for rehabilitation. Patient is a very poor historian, he is not exactly aware why he was transferred to [facility] emergency room today. His daughter was at bedside, apparently for the past few days while at the skilled nursing facility, patient has had worsening of his renal failure and hyperkalemia. This morning he was found to have altered mental status, and low blood sugars. Per daughter, she thinks her blood sugars were as low as in the 40s. He was then brought to the emergency room at [facility], a CT of the head was done which was negative for acute abnormalities, he was however found to have acute renal failure with hyperkalemia. The ED M.D. placed a left internal jugular line, apparently per the chest x-ray report, tip is in the left subclavian vein, apparently a right femoral jugular triple-lumen catheter was also placed.  The patient and the daughter but denied any nausea vomiting or diarrhea. Patient does complain of generalized body aches, but denies any chest pain or shortness of breath. The patient does complain of very poor appetite over the past few weeks.  He is being admitted to [facility] for further evaluation and treatment  of his hyperkalemia and acute renal failure. Patient renal function now back to baseline; needs SNF for discharge; active chest pain on 07/04/12; most likely GI in nature but with risk factors. On follow up on 6/25 pt in tachy-160 up to 180, with chest pain, EKG with afib,,and troponins elevated>>metoprolol>>cards consulted  Assessment/Plan:  Afib with RVR with Chest pain &Troponins elevated -pt given IVF, metoprolol -obtain echo -consult cards- [facility] for further recommendations including ?anticoagulation  Acute on chronic renal failure  - Suspect this is secondary to prerenal azotemia, medications (ACE inhibitor, Lasix and Aldactone) are the most likely culprits along with very poor oral intake. Patient  -continue indeterminately holding spironolactone and ACE inhibitor agents -CR continues to improve, lasix was resumed 6/24 but with tachy above pt given iv bolus as above-follow and recheck  . Hyperkalemia  - From acute renal failure, lisinopril and Aldactone therapy.  - slight elevation to 5.8; most likely due to hemolysis -resolved, follow and recheck  . Diabetes with Hypoglycemia  -This is due to acute renal failure and sulfonylurea use.  -will d/c Amaryl at discharge -stable. Will continue SSI while inpatient -at discharge discontinue any hypoglycemic agents and give a trial control with diet.  . Altered mental status  - Secondary to toxic metabolite encephalopathy from hypoglycemia and acute renal failure. -continue improving and pretty much back to baseline -will monitor.  . Anemia  - I suspect that this is a combination secondary to acute blood loss from  recent hip fracture and chronic renal failure. There is no overt evidence of blood loss. -will continue iron repletion as recommended by renal -will follow follow Hgb trend,  Hgb today 8.2  . HTN (hypertension)  -continue metoprolol, lasix and monitor -will avoid ACE and aldactone  . Prostate cancer and urinary  retention - History of prostate cancer status post radioactive seeding, [facility] with Dr. Dayton [facility]  - Will continue with Flomax and Ditropan   . Chronic diastolic heart failure  -History of chronic diastolic heart failure, currently clinically compensated.  -monitor fluid status closely with ivf today, follow and continue lasix as above  . Recent hip fracture and pain all over - Will need SNF per PT rec's -will use PRN tramadol -LE's dopplers negative for DVT.  Marland Kitchen History of CML  - Is on chronic nilotinib therapy, follows up at [facility] -continue nilotinib (family to bring medication)  . Intravenous access Issues  -IJ discontinue on 6/20 -femoral line discontinue on 6/22 -2 peripheral IV's in place  Moderate protein calorie malnutrition and hypoalbuminemia: -continue Nepro BID -encourage PO intake  DVT Prophylaxis: SCD's   Code Status: Full Family Communication: no family at bedside Disposition Plan: to be determine  Consultants:  nephrology  Procedures:  See below for x-ray reports  Antibiotics:  none  HPI/Subjective: Patient complaining sharp chest pain and per nsg tachy 160-180s. Denies SOB  Objective: Filed Vitals:   07/05/12 0731 07/05/12 0756 07/05/12 0800 07/05/12 0830  BP: 100/53 124/67 134/52 129/65  Pulse: 114 156 117 103  Temp:  99.4 F (37.4 C)    TempSrc: Oral Oral    Resp: 18 22 20 20   Height:      Weight:      SpO2: 94% 100% 100% 100%    Intake/Output Summary (Last 24 hours) at 07/05/12 0907 Last data filed at 07/05/12 0546  Gross per 24 hour  Intake    471 ml  Output    850 ml  Net   -379 ml   Filed Weights   07/02/12 2106 07/03/12 2145 07/04/12 2105  Weight: 83.7 kg (184 lb 8.4 oz) 83.7 kg (184 lb 8.4 oz) 80.4 kg (177 lb 4 oz)    Exam:   General:  AAOX2; afebrile,complaining of CP and slight SOB  Cardiovascular: S1 and S2, no rubs or gallops  Respiratory: decrease BS at bases, no  wheezing  Abdomen: soft, NT, ND, positive BS  Musculoskeletal: Trace edema-R. Foot with dependent pedal edema, no cyanosis  Data Reviewed: Basic Metabolic Panel:  Recent Labs Lab 07/01/12 0400 07/02/12 0535 07/03/12 0555 07/04/12 0606 07/05/12 0235  NA 133* 134* 138 138 135  K 5.4* 5.4* 5.7* 5.8* 6.4*  CL 97 98 105 104 103  CO2 28 25 26 25 24   GLUCOSE 221* 78 76 79 101*  BUN 109* 87* 62* 46* 36*  CREATININE 5.82* 3.81* 2.52* 2.07* 1.73*  CALCIUM 8.0* 8.3* 8.6 8.8 8.9  MG  --   --   --  1.3*  --   PHOS  --  4.5  --   --   --    Liver Function Tests:  Recent Labs Lab 06/30/12 1705 07/01/12 0400 07/02/12 0535  AST 25 19  --   ALT 19 15  --   ALKPHOS 61 52  --   BILITOT 0.4 0.4  --   PROT 5.8* 5.1*  --   ALBUMIN 2.6* 2.5* 2.6*   CBC:  Recent Labs Lab 06/30/12 1830 07/01/12 0400 07/02/12 0535 07/03/12 0555 07/04/12 0606 07/05/12 0235  WBC 11.4* 10.3 10.1 10.1 10.8* 16.2*  NEUTROABS  --  7.7 6.5  --   --   --  HGB 7.1* 6.8* 7.8* 7.4* 7.9* 8.2*  HCT 20.4* 19.4* 23.1* 23.0* 23.7* 24.5*  MCV 85.4 85.8 88.2 89.5 89.4 88.8  PLT 369 334 352 343 336 296   CBG:  Recent Labs Lab 07/04/12 0846 07/04/12 1154 07/04/12 1635 07/04/12 2127 07/05/12 0728  GLUCAP 109* 181* 105* 132* 142*    Recent Results (from the past 240 hour(s))  URINE CULTURE     Status: None   Collection Time    06/30/12  5:35 PM      Result Value Range Status   Specimen Description URINE, CATHETERIZED   Final   Special Requests NONE   Final   Culture  Setup Time 06/30/2012 18:38   Final   Colony Count NO GROWTH   Final   Culture NO GROWTH   Final   Report Status 07/01/2012 FINAL   Final  MRSA PCR SCREENING     Status: None   Collection Time    06/30/12  6:37 PM      Result Value Range Status   MRSA by PCR NEGATIVE  NEGATIVE Final   Comment:            The GeneXpert MRSA Assay (FDA     approved for NASAL specimens     only), is one component of a     comprehensive MRSA  colonization     surveillance program. It is not     intended to diagnose MRSA     infection nor to guide or     monitor treatment for     MRSA infections.     Studies: Dg Chest 2 View  07/04/2012   *RADIOLOGY REPORT*  Clinical Data: Shortness of breath.  CHEST - 2 VIEW  Comparison: 06/30/2012  Findings: Elevation of the right hemidiaphragm, stable.  Scarring or atelectasis in the right base.  Postoperative changes on the right.  Mild cardiomegaly and vascular congestion.  No confluent opacity on the left.  No visible effusions.  Increasing interstitial prominence of the lungs could reflect interstitial edema.  IMPRESSION: Postoperative changes on the right with chronic elevation of the right hemidiaphragm.  Question mild interstitial edema.   Original Report Authenticated By: Charlett Nose, M.D.    Scheduled Meds: . albuterol  2.5 mg Nebulization BID   And  . ipratropium  0.5 mg Nebulization BID  . aspirin  81 mg Oral Q48H  . clopidogrel  75 mg Oral Daily  . famotidine  40 mg Oral Daily  . feeding supplement (NEPRO CARB STEADY)  237 mL Oral Q24H  . feeding supplement  1 Container Oral Q24H  . ferric gluconate (FERRLECIT/NULECIT) IV  125 mg Intravenous Daily  . ferrous sulfate  325 mg Oral Q breakfast  . finasteride  5 mg Oral Daily  . furosemide  20 mg Oral Daily  . insulin aspart  0-9 Units Subcutaneous TID WC  . lubiprostone  24 mcg Oral BID WC  . magnesium oxide  400 mg Oral Daily  . metoprolol  5 mg Intravenous STAT  . metoprolol  25 mg Oral BID  . nilotinib  300 mg Oral Q12H  . nitroGLYCERIN      . oxybutynin  5 mg Oral QHS  . simvastatin  40 mg Oral QHS  . sodium chloride  3 mL Intravenous Q12H  . sucralfate  1 g Oral TID WC & HS  . tamsulosin  0.4 mg Oral BID  . vitamin E  400 Units Oral BID   Continuous Infusions: .  sodium chloride 10 mL/hr at 07/04/12 1623    Principal Problem:   Acute renal failure Active Problems:   Prostate cancer   Hyperkalemia   Anemia    Hypoglycemia   Altered mental status   HTN (hypertension)   Diabetes   Chronic diastolic heart failure   Intertrochanteric fracture of left hip    Time spent: critical care time   [facility]  [facility] Pager [phone]. If 7PM-7AM, please contact night-coverage at [email or web address], password [facility] 07/05/2012, 9:07 AM  LOS: 5 days

## 2012-07-05 NOTE — Significant Event (Addendum)
Rapid Response Event Note  Overview:  Called to assist with patient with elevated HR and some chest pain. Time Called: 0735 Arrival Time: 0740 Event Type: Cardiac  Initial Focused Assessment:  On arrival patient supine in bed - alert - skin hot and dry - NAD noted - states he has the same pain he has had since admission - points mostly to upper abd area - tender to palpate - states he feels a little different this AM but cannot describe - denies nausea or SOB - was placed on 2 liter nasal cannula prior to my arrival.  Bil BS present - some crackles noted LLL - otherwise few Rh - RR and slightly rapid but not labored - no pedal edema toed - no JVD - oral temp 99.6 BP 100/53 HR 114 - ST with lots PAC's - 12 lead noted - RR 22 O2 sats 94% on 2 liter nasal cannula.  Patient had hyperkalemia this AM treated with kayexalate per night time RN report.     Interventions:  500cc NS bolus started per order Dr. Donna Bernard.  IV patent.  0805  143/52  HR 117 99% O2 sat  Patient resting without distress.  HR 155 now with afib noted on 12 lead.  Patient denies CP or SOB or nausea.  Metroprolol 5 mg IV given per order.  HR 105 BP 129/65 RR 20 O2sats 100%. Back in NSR.  NS bolus complete - BS remain same.  Resps reg and unlabored.  Dr. Donna Bernard present - patient now resting - all pain subsided.  Handoff to [employer].  To call as needed.     Event Summary: Name of Physician Notified: Dr. Donna Bernard at  (PTA of [facility])    at    Outcome: Stayed in room and stabalized  Event End Time: 0825  [place]

## 2012-07-05 NOTE — Progress Notes (Signed)
CRITICAL VALUE ALERT  Critical value received:potassium 6.4  Date of notification: 07/05/12 Time of notification:  0340  Critical value read back:yes  Nurse who received alert:  Theresa Mulligan   MD notified (1st page): Kirtland Bouchard Schorr  Time of first page:  0410 MD notified (2nd page):   Time of second BJYN:[id number]  Responding MD: Merdis Delay  Time MD responded:  0425  Orders received

## 2012-07-05 NOTE — Significant Event (Signed)
Rapid Response Event Note Pt went into SVT per primary RN at 2216  Overview: Time Called: 2221 Arrival Time: 2245 Event Type: Cardiac  Initial Focused Assessment: On arrival to unit, pt found to be alert & speaking softly with c/o mid back pain.  HR 155-171 A.fib.  Nightly dose of lopressor given at 2226 & tramadol being given for back pain.  Texte paged Molli Barrows, NP to update & she will come see pt.  Interventions:   Event Summary: Name of Physician Notified: Merdis Delay, NP at 2253    at      [facility], Dale Taylor

## 2012-07-06 DIAGNOSIS — E7800 Pure hypercholesterolemia, unspecified: Secondary | ICD-10-CM

## 2012-07-06 DIAGNOSIS — I517 Cardiomegaly: Secondary | ICD-10-CM

## 2012-07-06 LAB — CBC
Hemoglobin: 7.8 g/dL — ABNORMAL LOW (ref 13.0–17.0)
MCH: 29.3 pg (ref 26.0–34.0)
Platelets: 312 10*3/uL (ref 150–400)
RBC: 2.66 MIL/uL — ABNORMAL LOW (ref 4.22–5.81)
WBC: 11.4 10*3/uL — ABNORMAL HIGH (ref 4.0–10.5)

## 2012-07-06 LAB — GLUCOSE, CAPILLARY
Glucose-Capillary: 101 mg/dL — ABNORMAL HIGH (ref 70–99)
Glucose-Capillary: 167 mg/dL — ABNORMAL HIGH (ref 70–99)
Glucose-Capillary: 182 mg/dL — ABNORMAL HIGH (ref 70–99)

## 2012-07-06 LAB — BASIC METABOLIC PANEL
CO2: 26 mEq/L (ref 19–32)
Chloride: 107 mEq/L (ref 96–112)
GFR calc Af Amer: 59 mL/min — ABNORMAL LOW (ref >90)
Potassium: 4.4 mEq/L (ref 3.5–5.1)
Sodium: 140 mEq/L (ref 135–145)

## 2012-07-06 LAB — TROPONIN I: Troponin I: <0.3 ng/mL (ref <0.30)

## 2012-07-06 MED ORDER — GLUCERNA PO LIQD: 237.0000 mL | Freq: Three times a day (TID) | ORAL | Status: DC

## 2012-07-06 MED ORDER — METOPROLOL TARTRATE 25 MG PO TABS
37.5000 mg | ORAL_TABLET | Freq: Two times a day (BID) | ORAL | Status: DC
  Administered 2012-07-06 – 2012-07-10 (×8): 37.5 mg via ORAL
  Filled 2012-07-06 (×9): qty 1

## 2012-07-06 MED ORDER — GLUCERNA SHAKE PO LIQD
237.0000 mL | Freq: Three times a day (TID) | ORAL | Status: DC
  Administered 2012-07-06 – 2012-07-09 (×5): 237 mL via ORAL

## 2012-07-06 NOTE — Progress Notes (Signed)
Echocardiogram 2D Echocardiogram has been performed.  Arvil Chaco 07/06/2012, 3:28 PM

## 2012-07-06 NOTE — Progress Notes (Addendum)
[facility] PROGRESS NOTE  Dale Taylor BJY:[id number] DOB: [birth year] DOA: 06/30/2012 PCP: Provider Not In System Brief summary 72 y.o. 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 During that admission his creatinine was approximately 4, he also had hyperkalemia during that admission. Unknown what his renal function was on discharge. He was subsequently discharged to a skilled nursing facility for rehabilitation. Patient is a very poor historian, he is not exactly aware why he was transferred to [facility] emergency room today. His daughter was at bedside, apparently for the past few days while at the skilled nursing facility, patient has had worsening of his renal failure and hyperkalemia. This morning he was found to have altered mental status, and low blood sugars. Per daughter, she thinks her blood sugars were as low as in the 40s. He was then brought to the emergency room at [facility], a CT of the head was done which was negative for acute abnormalities, he was however found to have acute renal failure with hyperkalemia. The ED M.D. placed a left internal jugular line, apparently per the chest x-ray report, tip is in the left subclavian vein, apparently a right femoral jugular triple-lumen catheter was also placed.  The patient and the daughter but denied any nausea vomiting or diarrhea. Patient does complain of generalized body aches, but denies any chest pain or shortness of breath. The patient does complain of very poor appetite over the past few weeks.  He is being admitted to [facility] for further evaluation and treatment  of his hyperkalemia and acute renal failure. Patient renal function now back to baseline; needs SNF for discharge; active chest pain on 07/04/12; most likely GI in nature but with risk factors. On follow up on 6/25 pt in tachy-160 up to 180, with chest pain, EKG with afib,,and troponins elevated>>metoprolol>>cards consulted  Assessment/Plan:  Afib with RVR with Chest pain &Troponins elevated -pt given IVF, metoprolol on 6/25 -better rate control this am 6/26, continue metoprolol - dose increased per cards -echo results pending, follow -appreciate cards input - please see below regarding anemia - Lexiscan myoview planned in am -cards to follow and further advise on anticoagulation, he is currently on plavix and ASA  Acute on chronic renal failure  - Suspect this is secondary to prerenal azotemia, medications (ACE inhibitor, Lasix and Aldactone) are the most likely culprits along with very poor oral intake. Patient  -continue indeterminately holding spironolactone and ACE inhibitor agents -CR continues to improve, lasix was resumed 6/24 but with tachy above pt was given iv bolus as above on 6/25, cr now normalized,follow   . Hyperkalemia  - From acute renal failure, lisinopril and Aldactone therapy.  - slight elevation to 5.8; most likely due to hemolysis -resolved, follow   . Diabetes with Hypoglycemia  -This is due to acute renal failure and sulfonylurea use.  -will d/c Amaryl at discharge -stable. Will continue SSI while inpatient -at discharge discontinue any hypoglycemic agents and give a trial control with diet. A1C was 5.5 on 6/21  . Altered mental status  - Secondary to toxic metabolite encephalopathy from hypoglycemia and acute renal failure. -continue improving and pretty much back to baseline -will  monitor.  . Normocytic Anemia  - I suspect that this is a combination secondary to acute blood loss from  recent hip fracture, chronic renal failure, CML and prostate Ca. There  is no overt evidence of blood loss. -will continue iron repletion as recommended by renal -will continue to follow Hgb trend,  Hgb today 7.8 today- still no gross bleeding, pt is on ASA and plavix wich will affect stool guaics if done at this time  . HTN (hypertension)  -continue metoprolol, lasix and monitor -will avoid ACE and aldactone secondary to hyperkalemia this hosp stay  . Prostate cancer and urinary retention - History of prostate cancer status post radioactive seeding, follows with Dr. [name] [facility] the cancer [facility]  - Will continue with Flomax and Ditropan   . Chronic diastolic heart failure  -History of chronic diastolic heart failure, currently clinically compensated.  -monitor fluid status closely with ivf today, follow and continue lasix as above  . Recent hip fracture and pain all over - Will need SNF per PT rec's -will use PRN tramadol -LE's dopplers negative for DVT.  Marland Kitchen History of CML  - Is on chronic nilotinib therapy, follows up at the [facility] -continue nilotinib (family to bring medication)  . Intravenous access Issues  -IJ discontinue on 6/20 -femoral line discontinue on 6/22 -2 peripheral IV's in place  Moderate protein calorie malnutrition and hypoalbuminemia: -continue Nepro BID -encourage PO intake  DVT Prophylaxis: SCD's   Code Status: Full Family Communication: no family at bedside Disposition Plan: to be determine  Consultants:  nephrology  Procedures:  See below for x-ray reports  Antibiotics:  none  HPI/Subjective: Patient sitting up in chair, denies CP and no SOB  Objective: Filed Vitals:   07/06/12 0818 07/06/12 0848 07/06/12 0850 07/06/12 0929  BP:   139/52 119/47  Pulse:  72 72 59  Temp:    98.1 F (36.7 C)  TempSrc:    Oral  Resp:    17  Height:      Weight:      SpO2: 100%   100%    Intake/Output Summary (Last 24 hours) at 07/06/12 1129 Last data filed at 07/06/12 0907  Gross per 24 hour  Intake  473.33 ml  Output    950 ml  Net -476.67 ml   Filed Weights   07/02/12 2106 07/03/12 2145 07/04/12 2105  Weight: 83.7 kg (184 lb 8.4 oz) 83.7 kg (184 lb 8.4 oz) 80.4 kg (177 lb 4 oz)    Exam:   General:  AAOX2; afebrile  Cardiovascular: S1 and S2, no rubs or gallops  Respiratory: decrease BS at bases, no wheezing  Abdomen: soft, NT, ND, positive BS  Musculoskeletal: Trace edema, no cyanosis  Data Reviewed: Basic Metabolic Panel:  Recent Labs Lab 07/01/12 0400 07/02/12 0535 07/03/12 0555 07/04/12 0606 07/05/12 0235 07/06/12 0610  NA 133* 134* 138 138 135 140  K 5.4* 5.4* 5.7* 5.8* 6.4* 4.4  CL 97 98 105 104 103 107  CO2 28 25 26 25 24 26   GLUCOSE 221* 78 76 79 101* 97  BUN 109* 87* 62* 46* 36* 30*  CREATININE 5.82* 3.81* 2.52* 2.07* 1.73* 1.35  CALCIUM 8.0* 8.3* 8.6 8.8 8.9 8.6  MG  --   --   --  1.3*  --   --   PHOS  --  4.5  --   --   --   --    Liver Function Tests:  Recent Labs Lab  06/30/12 1705 07/01/12 0400 07/02/12 0535  AST 25 19  --   ALT 19 15  --   ALKPHOS 61 52  --   BILITOT 0.4 0.4  --   PROT 5.8* 5.1*  --   ALBUMIN 2.6* 2.5* 2.6*   CBC:  Recent Labs Lab 06/30/12 1830 07/01/12 0400 07/02/12 0535 07/03/12 0555 07/04/12 0606 07/05/12 0235 07/06/12 0610  WBC 11.4* 10.3 10.1 10.1 10.8* 16.2* 11.4*  NEUTROABS  --  7.7 6.5  --   --   --   --   HGB 7.1* 6.8* 7.8* 7.4* 7.9* 8.2* 7.8*  HCT 20.4* 19.4* 23.1* 23.0* 23.7* 24.5* 24.2*  MCV 85.4 85.8 88.2 89.5 89.4 88.8 91.0  PLT 369 334 352 343 336 296 312   CBG:  Recent Labs Lab 07/05/12 0728 07/05/12 1132 07/05/12 1652 07/05/12 2104 07/06/12 0746  GLUCAP 142* 174* 110* 136* 101*    Recent Results (from the past 240 hour(s))  URINE CULTURE     Status: None   Collection Time    06/30/12  5:35 PM      Result Value Range Status   Specimen Description URINE, CATHETERIZED   Final   Special Requests NONE   Final   Culture  Setup Time 06/30/2012 18:38   Final   Colony Count NO  GROWTH   Final   Culture NO GROWTH   Final   Report Status 07/01/2012 FINAL   Final  MRSA PCR SCREENING     Status: None   Collection Time    06/30/12  6:37 PM      Result Value Range Status   MRSA by PCR NEGATIVE  NEGATIVE Final   Comment:            The GeneXpert MRSA Assay (FDA     approved for NASAL specimens     only), is one component of a     comprehensive MRSA colonization     surveillance program. It is not     intended to diagnose MRSA     infection nor to guide or     monitor treatment for     MRSA infections.     Studies: Dg Chest 2 View  07/04/2012   *RADIOLOGY REPORT*  Clinical Data: Shortness of breath.  CHEST - 2 VIEW  Comparison: 06/30/2012  Findings: Elevation of the right hemidiaphragm, stable.  Scarring or atelectasis in the right base.  Postoperative changes on the right.  Mild cardiomegaly and vascular congestion.  No confluent opacity on the left.  No visible effusions.  Increasing interstitial prominence of the lungs could reflect interstitial edema.  IMPRESSION: Postoperative changes on the right with chronic elevation of the right hemidiaphragm.  Question mild interstitial edema.   Original Report Authenticated By: Charlett Nose, M.D.    Scheduled Meds: . albuterol  2.5 mg Nebulization BID   And  . ipratropium  0.5 mg Nebulization BID  . aspirin  81 mg Oral Q48H  . clopidogrel  75 mg Oral Daily  . famotidine  40 mg Oral Daily  . feeding supplement (NEPRO CARB STEADY)  237 mL Oral Q24H  . feeding supplement  1 Container Oral Q24H  . ferrous sulfate  325 mg Oral Q breakfast  . finasteride  5 mg Oral Daily  . furosemide  20 mg Oral Daily  . insulin aspart  0-9 Units Subcutaneous TID WC  . lubiprostone  24 mcg Oral BID WC  . magnesium oxide  400 mg Oral Daily  .  metoprolol  37.5 mg Oral BID  . nilotinib  300 mg Oral Q12H  . oxybutynin  5 mg Oral QHS  . simvastatin  40 mg Oral QHS  . sodium chloride  3 mL Intravenous Q12H  . sucralfate  1 g Oral TID WC  & HS  . tamsulosin  0.4 mg Oral BID  . vitamin E  400 Units Oral BID   Continuous Infusions: . sodium chloride 10 mL/hr at 07/04/12 1623    Principal Problem:   Acute renal failure Active Problems:   Prostate cancer   Hyperkalemia   Anemia   Hypoglycemia   Altered mental status   HTN (hypertension)   Diabetes   Chronic diastolic heart failure   Intertrochanteric fracture of left hip   Atrial fibrillation with RVR    Time spent:   Kela Millin  [facility] Pager [phone]. If 7PM-7AM, please contact night-coverage at [email or web address], password [facility] 07/06/2012, 11:29 AM  LOS: 6 days

## 2012-07-06 NOTE — Progress Notes (Signed)
Subjective: Right knee pain.  occasional CP.  Last time was yesterday.  SOB sometimes.  Objective: Vital signs in last 24 hours: Temp:  [98 F (36.7 C)-98.5 F (36.9 C)] 98.1 F (36.7 C) (06/26 0929) Pulse Rate:  [59-112] 59 (06/26 0929) Resp:  [17-18] 17 (06/26 0929) BP: (112-148)/(46-60) 119/47 mmHg (06/26 0929) SpO2:  [99 %-100 %] 100 % (06/26 0929) Last BM Date: 07/05/12  Intake/Output from previous day: 06/25 0701 - 06/26 0700 In: 735 [P.O.:480; I.V.:255] Out: 1250 [Urine:1250] Intake/Output this shift:    Medications Current Facility-Administered Medications  Medication Dose Route Frequency Provider Last Rate Last Dose  . 0.9 %  sodium chloride infusion   Intravenous Continuous Vassie Loll, MD 10 mL/hr at 07/04/12 1623    . acetaminophen (TYLENOL) tablet 650 mg  650 mg Oral Q6H PRN Maretta Bees, MD   650 mg at 07/04/12 1353   Or  . acetaminophen (TYLENOL) suppository 650 mg  650 mg Rectal Q6H PRN Shanker Levora Dredge, MD      . albuterol (PROVENTIL) (5 MG/ML) 0.5% nebulizer solution 2.5 mg  2.5 mg Nebulization Q2H PRN Shanker Levora Dredge, MD      . albuterol (PROVENTIL) (5 MG/ML) 0.5% nebulizer solution 2.5 mg  2.5 mg Nebulization BID Vassie Loll, MD   2.5 mg at 07/06/12 0818   And  . ipratropium (ATROVENT) nebulizer solution 0.5 mg  0.5 mg Nebulization BID Vassie Loll, MD   0.5 mg at 07/06/12 0818  . aspirin chewable tablet 81 mg  81 mg Oral Q48H Maretta Bees, MD   81 mg at 07/04/12 1729  . clopidogrel (PLAVIX) tablet 75 mg  75 mg Oral Daily Maretta Bees, MD   75 mg at 07/06/12 0849  . famotidine (PEPCID) tablet 40 mg  40 mg Oral Daily Vassie Loll, MD   40 mg at 07/06/12 0849  . feeding supplement (NEPRO CARB STEADY) liquid 237 mL  237 mL Oral Q24H Haynes Bast, RD   237 mL at 07/05/12 1352  . feeding supplement (RESOURCE BREEZE) liquid 1 Container  1 Container Oral Q24H Haynes Bast, RD   1 Container at 07/06/12 [phone]  . ferrous sulfate  tablet 325 mg  325 mg Oral Q breakfast Maretta Bees, MD   325 mg at 07/06/12 0849  . finasteride (PROSCAR) tablet 5 mg  5 mg Oral Daily Maretta Bees, MD   5 mg at 07/06/12 0849  . furosemide (LASIX) tablet 20 mg  20 mg Oral Daily Vassie Loll, MD   20 mg at 07/06/12 0850  . gi cocktail (Maalox,Lidocaine,Donnatal)  30 mL Oral TID PRN Vassie Loll, MD   30 mL at 07/04/12 1355  . insulin aspart (novoLOG) injection 0-9 Units  0-9 Units Subcutaneous TID WC Vassie Loll, MD   2 Units at 07/05/12 1230  . lubiprostone (AMITIZA) capsule 24 mcg  24 mcg Oral BID WC Maretta Bees, MD   24 mcg at 07/06/12 0850  . magnesium oxide (MAG-OX) tablet 400 mg  400 mg Oral Daily Vassie Loll, MD   400 mg at 07/06/12 0848  . metoprolol tartrate (LOPRESSOR) tablet 25 mg  25 mg Oral BID Kela Millin, MD   25 mg at 07/06/12 0850  . nilotinib (TASIGNA) capsule CAPS 300 mg  300 mg Oral Q12H Vassie Loll, MD   300 mg at 07/06/12 0851  . ondansetron (ZOFRAN) tablet 4 mg  4 mg Oral Q6H PRN Shanker Levora Dredge,  MD   4 mg at 07/01/12 0357   Or  . ondansetron (ZOFRAN) injection 4 mg  4 mg Intravenous Q6H PRN Maretta Bees, MD      . oxybutynin (DITROPAN-XL) 24 hr tablet 5 mg  5 mg Oral QHS Maretta Bees, MD   5 mg at 07/05/12 2345  . simvastatin (ZOCOR) tablet 40 mg  40 mg Oral QHS Maretta Bees, MD   40 mg at 07/05/12 2345  . sodium chloride 0.9 % injection 3 mL  3 mL Intravenous Q12H Maretta Bees, MD   3 mL at 07/04/12 0956  . sucralfate (CARAFATE) 1 GM/10ML suspension 1 g  1 g Oral TID WC & HS Vassie Loll, MD   1 g at 07/06/12 0849  . tamsulosin (FLOMAX) capsule 0.4 mg  0.4 mg Oral BID Maretta Bees, MD   0.4 mg at 07/06/12 0849  . traMADol (ULTRAM) tablet 50 mg  50 mg Oral Q6H PRN Vassie Loll, MD   50 mg at 07/05/12 2256  . vitamin E capsule 400 Units  400 Units Oral BID Maretta Bees, MD   400 Units at 07/06/12 0849    PE: General appearance: alert, cooperative and no  distress Lungs: Mild left basilar rales.  No Wheeze. Heart: regular rate and rhythm, S1, S2 normal, no murmur, click, rub or gallop Extremities: No LEE Pulses: Radials 2+ and symmetric.  1+ PTs.  Neurologic: Grossly normal  Lab Results:   Recent Labs  07/04/12 0606 07/05/12 0235 07/06/12 0610  WBC 10.8* 16.2* 11.4*  HGB 7.9* 8.2* 7.8*  HCT 23.7* 24.5* 24.2*  PLT 336 296 312   BMET  Recent Labs  07/04/12 0606 07/05/12 0235 07/06/12 0610  NA 138 135 140  K 5.8* 6.4* 4.4  CL 104 103 107  CO2 25 24 26   GLUCOSE 79 101* 97  BUN 46* 36* 30*  CREATININE 2.07* 1.73* 1.35  CALCIUM 8.8 8.9 8.6    Assessment/Plan  Principal Problem:   Acute renal failure Active Problems:   Prostate cancer   Hyperkalemia   Anemia   Hypoglycemia   Altered mental status   HTN (hypertension)   Diabetes   Chronic diastolic heart failure   Intertrochanteric fracture of left hip   Atrial fibrillation with RVR  Plan: Maintaining NSR in the 80's.  Short runs of PAF with RVR recorded on tele. On  Lopressor 25 bid.  Will increase to 37.5 mg.  Last troponin 0.59.  2D echo pending.   Improved SCr.      LOS: 6 days    Marillyn Goren 07/06/2012 10:20 AM

## 2012-07-06 NOTE — Progress Notes (Addendum)
I have seen and evaluated the patient this AM along with Wilburt Finlay, PA. I agree with his findings, examination as well as impression recommendations.  Agree with increasing BB dose in setting of RVR episode. CCB converted to non-dihydropyridine for additional AVN effect.  With + troponin levels - recheck today (as add-on), would need ischemic eval -- prefer non-invasive due to recent ARF -- will order Lexiscan Myoview in AM (has known PAD) -- troponin now normal, arguing for tachycardia mediated injury  Will need to consider anticoagulation options as well for Afib, but need to ensure no active bleed vs. ? CML/Prostate CA realted as his Hgb is now 7.8.   MD Time with pt: 10 min  HARDING,DAVID W, M.D., M.S. [facility] [address] [place], [place]  [address]  [phone] Pager # [phone] 07/06/2012 12:32 PM

## 2012-07-06 NOTE — Progress Notes (Signed)
Physical Therapy Treatment Patient Details Name: Dale Taylor MRN: [id number] DOB: [birth year] Today's Date: 07/06/2012 Time: 4098-1191 PT Time Calculation (min): 27 min  PT Assessment / Plan / Recommendation  PT Comments   Pt continues to improve in alertness and participation with PT.  He will benefit from continued PT at SNF to regain functional independence  Follow Up Recommendations  SNF     Does the patient have the potential to tolerate intense rehabilitation     Barriers to Discharge        Equipment Recommendations  Other (comment) (tbd)    Recommendations for Other Services    Frequency Min 3X/week   Progress towards PT Goals Progress towards PT goals: Progressing toward goals  Plan      Precautions / Restrictions Precautions Precautions: Fall Restrictions Weight Bearing Restrictions: Yes LLE Weight Bearing:  (left message today with SNF asking for clarification)   Pertinent Vitals/Pain No c/o pain in left hip with exercise or weight bearing    Mobility  Bed Mobility Supine to Sit: 4: Min assist;With rails;HOB elevated Sitting - Scoot to Edge of Bed: 4: Min assist;With rail Details for Bed Mobility Assistance: cues for technique, increased time required with mobility and redirection to task needed. Transfers Transfers: Sit to Stand;Stand to Sit Sit to Stand: 4: Min assist;3: Mod assist Stand to Sit: 3: Mod assist Details for Transfer Assistance: more assist needed to stand from low chair with pt stating he is fearful of falling again Ambulation/Gait Ambulation/Gait Assistance: Not tested (comment);4: Min assist Ambulation Distance (Feet): 20 Feet Assistive device: Rolling walker Ambulation/Gait Assistance Details: pt followed closely with chair.  Pt cued to limite weight bearing on LLE, but he had no c/o pain Gait Pattern: Step-to pattern;Decreased step length - right;Decreased step length - left Gait velocity: decr General Gait Details: Pt with limited  endurance for activity, but does better with RW    Exercises General Exercises - Upper Extremity Shoulder Flexion: AROM;Both;5 reps;Supine Elbow Flexion: AROM;Both;5 reps;Supine Elbow Extension: AROM;Both;5 reps;Supine General Exercises - Lower Extremity Ankle Circles/Pumps: AROM;Both;10 reps;Supine Gluteal Sets: AROM;Both;5 reps;Standing Short Arc Quad: AROM;Left;15 reps;Supine Straight Leg Raises: AROM;Right;5 reps;Supine Hip Flexion/Marching: AROM;Right;10 reps;Supine Other Exercises Other Exercises: trunk extension in sitting and standing   PT Diagnosis:    PT Problem List:   PT Treatment Interventions:     PT Goals (current goals can now be found in the care plan section) Acute Rehab PT Goals Patient Stated Goal: to walk PT Goal Formulation: Patient unable to participate in goal setting Time For Goal Achievement: 07/16/12 Potential to Achieve Goals: Good  Visit Information  Last PT Received On: 07/06/12    Subjective Data  Patient Stated Goal: to walk   Cognition  Cognition Arousal/Alertness: Awake/alert Behavior During Therapy: Flat affect Overall Cognitive Status: Within Functional Limits for tasks assessed    Balance  Balance Balance Assessed: Yes Static Sitting Balance Static Sitting - Balance Support: No upper extremity supported;Bilateral upper extremity supported Static Sitting - Level of Assistance: 5: Stand by assistance Static Standing Balance Static Standing - Balance Support: Bilateral upper extremity supported Static Standing - Level of Assistance: 4: Min assist Static Standing - Comment/# of Minutes: encouraged to use trunk and hip extensors to stand up straight  End of Session PT - End of Session Equipment Utilized During Treatment: Gait belt Activity Tolerance: Patient tolerated treatment well Patient left: with call bell/phone within reach;in chair;with chair alarm set Nurse Communication: Mobility status   GP  Ebony Hail  [facility] 07/06/2012, 10:43 AM

## 2012-07-07 ENCOUNTER — Inpatient Hospital Stay (HOSPITAL_COMMUNITY): Payer: Medicare Other

## 2012-07-07 DIAGNOSIS — R079 Chest pain, unspecified: Secondary | ICD-10-CM

## 2012-07-07 DIAGNOSIS — I214 Non-ST elevation (NSTEMI) myocardial infarction: Secondary | ICD-10-CM

## 2012-07-07 LAB — BASIC METABOLIC PANEL
BUN: 30 mg/dL — ABNORMAL HIGH (ref 6–23)
Calcium: 8.5 mg/dL (ref 8.4–10.5)
GFR calc Af Amer: 65 mL/min — ABNORMAL LOW (ref >90)
GFR calc non Af Amer: 56 mL/min — ABNORMAL LOW (ref >90)
Glucose, Bld: 91 mg/dL (ref 70–99)
Potassium: 4.4 mEq/L (ref 3.5–5.1)
Sodium: 139 mEq/L (ref 135–145)

## 2012-07-07 LAB — GLUCOSE, CAPILLARY
Glucose-Capillary: 95 mg/dL (ref 70–99)
Glucose-Capillary: 113 mg/dL — ABNORMAL HIGH (ref 70–99)
Glucose-Capillary: 157 mg/dL — ABNORMAL HIGH (ref 70–99)

## 2012-07-07 LAB — CBC
Hemoglobin: 7.5 g/dL — ABNORMAL LOW (ref 13.0–17.0)
MCH: 29.6 pg (ref 26.0–34.0)
MCHC: 32.6 g/dL (ref 30.0–36.0)
RDW: 14.4 % (ref 11.5–15.5)

## 2012-07-07 MED ORDER — REGADENOSON 0.4 MG/5ML IV SOLN
0.4000 mg | Freq: Once | INTRAVENOUS | Status: AC
  Administered 2012-07-07: 0.4 mg via INTRAVENOUS
  Filled 2012-07-07: qty 5

## 2012-07-07 MED ORDER — IPRATROPIUM BROMIDE 0.02 % IN SOLN: 0.5000 mg | Freq: Three times a day (TID) | RESPIRATORY_TRACT | Status: DC | PRN

## 2012-07-07 MED ORDER — ALBUTEROL SULFATE (5 MG/ML) 0.5% IN NEBU: 2.5000 mg | INHALATION_SOLUTION | Freq: Three times a day (TID) | RESPIRATORY_TRACT | Status: DC | PRN

## 2012-07-07 MED ORDER — SODIUM CHLORIDE 0.9 % IJ SOLN
80.0000 mg | INTRAMUSCULAR | Status: AC
  Filled 2012-07-07: qty 3.2

## 2012-07-07 MED ORDER — TECHNETIUM TC 99M SESTAMIBI GENERIC - CARDIOLITE
30.0000 | Freq: Once | INTRAVENOUS | Status: AC | PRN
  Administered 2012-07-07: 30 via INTRAVENOUS

## 2012-07-07 MED ORDER — TECHNETIUM TC 99M SESTAMIBI GENERIC - CARDIOLITE
10.0000 | Freq: Once | INTRAVENOUS | Status: AC | PRN
  Administered 2012-07-07: 10 via INTRAVENOUS

## 2012-07-07 NOTE — Progress Notes (Signed)
Lexiscan myoview completed without complications. 

## 2012-07-07 NOTE — Progress Notes (Addendum)
[facility] PROGRESS NOTE  Dale Taylor KGM:[id number] DOB: 06-10-1940 DOA: 06/30/2012 PCP: Provider Not In System Brief summary 72 y.o. 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 During that admission his creatinine was approximately 4, he also had hyperkalemia during that admission. Unknown what his renal function was on discharge. He was subsequently discharged to a skilled nursing facility for rehabilitation. Patient is a very poor historian, he is not exactly aware why he was transferred to [facility] emergency room today. His daughter was at bedside, apparently for the past few days while at the skilled nursing facility, patient has had worsening of his renal failure and hyperkalemia. This morning he was found to have altered mental status, and low blood sugars. Per daughter, she thinks her blood sugars were as low as in the 40s. He was then brought to the emergency room at [facility], a CT of the head was done which was negative for acute abnormalities, he was however found to have acute renal failure with hyperkalemia. The ED M.D. placed a left internal jugular line, apparently per the chest x-ray report, tip is in the left subclavian vein, apparently a right femoral jugular triple-lumen catheter was also placed.  The patient and the daughter but denied any nausea vomiting or diarrhea. Patient does complain of generalized body aches, but denies any chest pain or shortness of breath. The patient does complain of very poor appetite over the past few weeks.  He is being admitted to [facility] for further evaluation and treatment  of his hyperkalemia and acute renal failure. Patient renal function now back to baseline; needs SNF for discharge; active chest pain on 07/04/12; most likely GI in nature but with risk factors. On follow up on 6/25 pt in tachy-160 up to 180, with chest pain, EKG with afib,,and troponins elevated>>metoprolol>>cards consulted  Assessment/Plan:  Afib with RVR with Chest pain &Troponins elevated -pt given IVF, metoprolol on 6/25 -better rate control this am 6/26, continue metoprolol - dose increased per cards -echo results pending, follow -appreciate cards input - please see below regarding anemia; follow and transfuse if hh <7/21 - Lexiscan myoview done this am showing Large inferolateral scar/infarct  No findings for ischemia -cards to follow and further advise on anticoagulation, he is currently on plavix and ASA  Acute on chronic renal failure  - Suspect this is secondary to prerenal azotemia, medications (ACE inhibitor, Lasix and Aldactone) are the most likely culprits along with very poor oral intake. Patient  -continue indeterminately holding spironolactone and ACE inhibitor agents -CR normalized and remains stable , lasix was resumed 6/24- continue but with tachy above pt was given iv bolus as above on 6/25  . Hyperkalemia  - From acute renal failure, lisinopril and Aldactone therapy.  -resolved, follow   . Diabetes with Hypoglycemia  -This is due to acute renal failure and sulfonylurea use.  -will d/c Amaryl at discharge -stable. Will continue SSI while inpatient -at discharge discontinue any hypoglycemic agents and give a trial control with diet. A1C was 5.5 on 6/21 -no further hypoglycemia, follow  . Altered mental status  - Secondary to toxic metabolite encephalopathy from hypoglycemia and acute renal failure. -continue  improving and pretty much back to baseline -will monitor.  . Normocytic Anemia  - I suspect that this is a combination secondary to acute blood loss from   recent hip fracture, chronic renal failure, CML and prostate Ca. There is no overt evidence of blood loss. -will continue iron repletion as recommended by renal -will continue to follow Hgb trend,  Hgb today 7.8 today- still no gross bleeding, pt is on ASA and plavix wich will affect stool guaics if done at this time -pt changed to niferex for better absorption.  Marland Kitchen HTN (hypertension)  -continue metoprolol, lasix and monitor -will avoid ACE and aldactone secondary to hyperkalemia this hosp stay  . Prostate cancer and urinary retention - History of prostate cancer status post radioactive seeding, follows with Dr. [name] [facility]  - Will continue with Flomax and Ditropan   . Chronic diastolic heart failure  -History of chronic diastolic heart failure, currently clinically compensated.  -was monitor fluid status closely with ivf 6/26,  -continue lasix as above  . Recent hip fracture and pain all over - Will need SNF per PT rec's -will use PRN tramadol -LE's dopplers negative for DVT.  Marland Kitchen History of CML  - Is on chronic nilotinib therapy, follows up at the [facility] -continue nilotinib (family to bring medication)  . Intravenous access Issues  -IJ discontinue on 6/20 -femoral line discontinue on 6/22 -2 peripheral IV's in place  Moderate protein calorie malnutrition and hypoalbuminemia: -continue Nepro BID -encourage PO intake  DVT Prophylaxis: SCD's   Code Status: Full Family Communication: no family at bedside Disposition Plan: to be determine  Consultants:  nephrology  Procedures:  See below for x-ray reports  Antibiotics:  none  HPI/Subjective: Patient sitting up in chair, denies CP and no SOB. States better po intake today.  Objective: Filed Vitals:   07/06/12 2142 07/07/12 0504 07/07/12 0806 07/07/12 0937  BP: 122/47 131/54  145/53  Pulse: 88 72  79  Temp: 98.5 F (36.9 C) 98 F (36.7 C)  98.7 F (37.1 C)  TempSrc: Oral Oral   Oral  Resp: 18 14  18   Height:      Weight: 76.7 kg (169 lb 1.5 oz)     SpO2: 100% 100% 97% 100%    Intake/Output Summary (Last 24 hours) at 07/07/12 1107 Last data filed at 07/07/12 0418  Gross per 24 hour  Intake    570 ml  Output   1000 ml  Net   -430 ml   Filed Weights   07/03/12 2145 07/04/12 2105 07/06/12 2142  Weight: 83.7 kg (184 lb 8.4 oz) 80.4 kg (177 lb 4 oz) 76.7 kg (169 lb 1.5 oz)    Exam:   General:  AAOX2; afebrile  Cardiovascular: S1 and S2, no rubs or gallops  Respiratory: decrease BS at bases, no wheezing  Abdomen: soft, NT, ND, positive BS  Musculoskeletal: Trace edema, no cyanosis  Data Reviewed: Basic Metabolic Panel:  Recent Labs Lab 07/01/12 0400 07/02/12 0535 07/03/12 0555 07/04/12 0606 07/05/12 0235 07/06/12 0610 07/07/12 0615  NA 133* 134* 138 138 135 140 139  K 5.4* 5.4* 5.7* 5.8* 6.4* 4.4 4.4  CL 97 98 105 104 103 107 106  CO2 28 25 26 25 24 26 27   GLUCOSE 221* 78 76 79 101* 97 91  BUN 109* 87* 62* 46* 36* 30* 30*  CREATININE 5.82* 3.81* 2.52* 2.07* 1.73* 1.35 1.26  CALCIUM 8.0* 8.3* 8.6 8.8 8.9 8.6 8.5  MG  --   --   --  1.3*  --   --   --   PHOS  --  4.5  --   --   --   --   --    Liver Function Tests:  Recent Labs Lab 06/30/12 1705 07/01/12 0400 07/02/12 0535  AST 25 19  --   ALT 19 15  --   ALKPHOS 61 52  --   BILITOT 0.4 0.4  --   PROT 5.8* 5.1*  --   ALBUMIN 2.6* 2.5* 2.6*   CBC:  Recent Labs Lab 06/30/12 1830 07/01/12 0400 07/02/12 0535 07/03/12 0555 07/04/12 0606 07/05/12 0235 07/06/12 0610 07/07/12 0615  WBC 11.4* 10.3 10.1 10.1 10.8* 16.2* 11.4* 10.7*  NEUTROABS  --  7.7 6.5  --   --   --   --   --   HGB 7.1* 6.8* 7.8* 7.4* 7.9* 8.2* 7.8* 7.5*  HCT 20.4* 19.4* 23.1* 23.0* 23.7* 24.5* 24.2* 23.0*  MCV 85.4 85.8 88.2 89.5 89.4 88.8 91.0 90.9  PLT 369 334 352 343 336 296 312 302   CBG:  Recent Labs Lab 07/06/12 0746 07/06/12 1158 07/06/12 1702 07/06/12 2136 07/07/12 0804  GLUCAP 101*  160* 167* 182* 95    Recent Results (from the past 240 hour(s))  URINE CULTURE     Status: None   Collection Time    06/30/12  5:35 PM      Result Value Range Status   Specimen Description URINE, CATHETERIZED   Final   Special Requests NONE   Final   Culture  Setup Time 06/30/2012 18:38   Final   Colony Count NO GROWTH   Final   Culture NO GROWTH   Final   Report Status 07/01/2012 FINAL   Final  MRSA PCR SCREENING     Status: None   Collection Time    06/30/12  6:37 PM      Result Value Range Status   MRSA by PCR NEGATIVE  NEGATIVE Final   Comment:            The GeneXpert MRSA Assay (FDA     approved for NASAL specimens     only), is one component of a     comprehensive MRSA colonization     surveillance program. It is not     intended to diagnose MRSA     infection nor to guide or     monitor treatment for     MRSA infections.     Studies: No results found.  Scheduled Meds: . albuterol  2.5 mg Nebulization BID   And  . ipratropium  0.5 mg Nebulization BID  . aspirin  81 mg Oral Q48H  . clopidogrel  75 mg Oral Daily  . famotidine  40 mg Oral Daily  . feeding supplement  237 mL Oral TID BM  . feeding supplement (NEPRO CARB STEADY)  237 mL Oral Q24H  . feeding supplement  1 Container Oral Q24H  . ferrous sulfate  325 mg Oral Q breakfast  . finasteride  5 mg Oral Daily  . furosemide  20 mg Oral Daily  . insulin aspart  0-9 Units Subcutaneous TID WC  . lubiprostone  24 mcg Oral BID WC  . magnesium oxide  400 mg Oral Daily  . metoprolol  37.5 mg Oral BID  . nilotinib  300 mg Oral Q12H  . oxybutynin  5 mg Oral QHS  . simvastatin  40 mg Oral  QHS  . sodium chloride  3 mL Intravenous Q12H  . sucralfate  1 g Oral TID WC & HS  . tamsulosin  0.4 mg Oral BID  . vitamin E  400 Units Oral BID   Continuous Infusions: . sodium chloride 10 mL/hr at 07/04/12 1623    Principal Problem:   Acute renal failure Active Problems:   Prostate cancer   Hyperkalemia   Iron  deficiency anemia   Hypoglycemia   Altered mental status   HTN (hypertension)   Diabetes   Chronic diastolic heart failure   Intertrochanteric fracture of left hip   Atrial fibrillation with RVR    Time spent:   Kela Millin  [facility] Pager [phone]. If 7PM-7AM, please contact night-coverage at [email or web address], password [facility] 07/07/2012, 11:07 AM  LOS: 7 days

## 2012-07-07 NOTE — Progress Notes (Signed)
PT Cancellation Note  Patient Details Name: Ogden Handlin MRN: [id number] DOB: 03/18/1940   Cancelled Treatment:    Reason Eval/Treat Not Completed: Patient at procedure or test/unavailable Cardiac test today   Donnetta Hail 07/07/2012, 9:12 AM

## 2012-07-07 NOTE — Progress Notes (Signed)
DAILY PROGRESS NOTE  Subjective:  No events overnight.  Remains in sinus rhythm at 84.  Apparently Type II MI in the setting of a-fib with RVR with mild troponin elevation. He has no chest pain complaints.  Echo yesterday shows preserved LVEF of 55-60% with mild LVH, borderline aneurysmal interatrial septum, no wall motion abnormalities.  Objective:  Temp:  [98 F (36.7 C)-98.6 F (37 C)] 98 F (36.7 C) (06/27 0504) Pulse Rate:  [59-88] 72 (06/27 0504) Resp:  [14-18] 14 (06/27 0504) BP: (114-131)/(47-62) 131/54 mmHg (06/27 0504) SpO2:  [97 %-100 %] 97 % (06/27 0806) Weight:  [169 lb 1.5 oz (76.7 kg)] 169 lb 1.5 oz (76.7 kg) (06/26 2142) Weight change:   Intake/Output from previous day: 06/26 0701 - 06/27 0700 In: 570 [P.O.:570] Out: 1250 [Urine:1250]  Intake/Output from this shift:    Medications: Current Facility-Administered Medications  Medication Dose Route Frequency Provider Last Rate Last Dose  . 0.9 %  sodium chloride infusion   Intravenous Continuous Vassie Loll, MD 10 mL/hr at 07/04/12 1623    . acetaminophen (TYLENOL) tablet 650 mg  650 mg Oral Q6H PRN Maretta Bees, MD   650 mg at 07/06/12 1100   Or  . acetaminophen (TYLENOL) suppository 650 mg  650 mg Rectal Q6H PRN Shanker Levora Dredge, MD      . albuterol (PROVENTIL) (5 MG/ML) 0.5% nebulizer solution 2.5 mg  2.5 mg Nebulization Q2H PRN Shanker Levora Dredge, MD      . albuterol (PROVENTIL) (5 MG/ML) 0.5% nebulizer solution 2.5 mg  2.5 mg Nebulization BID Vassie Loll, MD   2.5 mg at 07/07/12 1191   And  . ipratropium (ATROVENT) nebulizer solution 0.5 mg  0.5 mg Nebulization BID Vassie Loll, MD   0.5 mg at 07/07/12 0804  . aspirin chewable tablet 81 mg  81 mg Oral Q48H Maretta Bees, MD   81 mg at 07/06/12 1728  . clopidogrel (PLAVIX) tablet 75 mg  75 mg Oral Daily Maretta Bees, MD   75 mg at 07/06/12 0849  . famotidine (PEPCID) tablet 40 mg  40 mg Oral Daily Vassie Loll, MD   40 mg at 07/06/12 0849    . feeding supplement (GLUCERNA SHAKE) liquid 237 mL  237 mL Oral TID BM Kela Millin, MD   237 mL at 07/06/12 2139  . feeding supplement (NEPRO CARB STEADY) liquid 237 mL  237 mL Oral Q24H Haynes Bast, RD   237 mL at 07/06/12 1307  . feeding supplement (RESOURCE BREEZE) liquid 1 Container  1 Container Oral Q24H Haynes Bast, RD   1 Container at 07/06/12 [id number]  . ferrous sulfate tablet 325 mg  325 mg Oral Q breakfast Maretta Bees, MD   325 mg at 07/06/12 0849  . finasteride (PROSCAR) tablet 5 mg  5 mg Oral Daily Maretta Bees, MD   5 mg at 07/06/12 0849  . furosemide (LASIX) tablet 20 mg  20 mg Oral Daily Vassie Loll, MD   20 mg at 07/06/12 0850  . gi cocktail (Maalox,Lidocaine,Donnatal)  30 mL Oral TID PRN Vassie Loll, MD   30 mL at 07/04/12 1355  . insulin aspart (novoLOG) injection 0-9 Units  0-9 Units Subcutaneous TID WC Vassie Loll, MD   2 Units at 07/06/12 1724  . lubiprostone (AMITIZA) capsule 24 mcg  24 mcg Oral BID WC Maretta Bees, MD   24 mcg at 07/06/12 1725  . magnesium oxide (MAG-OX) tablet 400 mg  400 mg Oral Daily Vassie Loll, MD   400 mg at 07/06/12 0848  . metoprolol tartrate (LOPRESSOR) tablet 37.5 mg  37.5 mg Oral BID Wilburt Finlay, PA-C   37.5 mg at 07/06/12 2139  . nilotinib (TASIGNA) capsule CAPS 300 mg  300 mg Oral Q12H Vassie Loll, MD   300 mg at 07/06/12 2137  . ondansetron (ZOFRAN) tablet 4 mg  4 mg Oral Q6H PRN Maretta Bees, MD   4 mg at 07/01/12 0357   Or  . ondansetron (ZOFRAN) injection 4 mg  4 mg Intravenous Q6H PRN Maretta Bees, MD      . oxybutynin (DITROPAN-XL) 24 hr tablet 5 mg  5 mg Oral QHS Maretta Bees, MD   5 mg at 07/06/12 2139  . simvastatin (ZOCOR) tablet 40 mg  40 mg Oral QHS Maretta Bees, MD   40 mg at 07/06/12 2139  . sodium chloride 0.9 % injection 3 mL  3 mL Intravenous Q12H Maretta Bees, MD   3 mL at 07/04/12 0956  . sucralfate (CARAFATE) 1 GM/10ML suspension 1 g  1 g Oral TID WC & HS  Vassie Loll, MD   1 g at 07/07/12 0830  . tamsulosin (FLOMAX) capsule 0.4 mg  0.4 mg Oral BID Maretta Bees, MD   0.4 mg at 07/06/12 2139  . traMADol (ULTRAM) tablet 50 mg  50 mg Oral Q6H PRN Vassie Loll, MD   50 mg at 07/06/12 2144  . vitamin E capsule 400 Units  400 Units Oral BID Maretta Bees, MD   400 Units at 07/06/12 2139    Physical Exam: General appearance: alert and no distress Neck: no adenopathy, no carotid bruit, no JVD, supple, symmetrical, trachea midline and thyroid not enlarged, symmetric, no tenderness/mass/nodules Lungs: clear to auscultation bilaterally Heart: regular rate and rhythm, S1, S2 normal, no murmur, click, rub or gallop Abdomen: soft, non-tender; bowel sounds normal; no masses,  no organomegaly Extremities: extremities normal, atraumatic, no cyanosis or edema Pulses: 2+ and symmetric Skin: Skin color, texture, turgor normal. No rashes or lesions Neurologic: Grossly normal  Lab Results: Results for orders placed during the hospital encounter of 06/30/12 (from the past 48 hour(s))  GLUCOSE, CAPILLARY     Status: Abnormal   Collection Time    07/05/12 11:32 AM      Result Value Range   Glucose-Capillary 174 (*) 70 - 99 mg/dL  OCCULT BLOOD X 1 CARD TO LAB, STOOL     Status: None   Collection Time    07/05/12  3:42 PM      Result Value Range   Fecal Occult Bld NEGATIVE  NEGATIVE  GLUCOSE, CAPILLARY     Status: Abnormal   Collection Time    07/05/12  4:52 PM      Result Value Range   Glucose-Capillary 110 (*) 70 - 99 mg/dL  GLUCOSE, CAPILLARY     Status: Abnormal   Collection Time    07/05/12  9:04 PM      Result Value Range   Glucose-Capillary 136 (*) 70 - 99 mg/dL  BASIC METABOLIC PANEL     Status: Abnormal   Collection Time    07/06/12  6:10 AM      Result Value Range   Sodium 140  135 - 145 mEq/L   Potassium 4.4  3.5 - 5.1 mEq/L   Chloride 107  96 - 112 mEq/L   CO2 26  19 - 32 mEq/L  Glucose, Bld 97  70 - 99 mg/dL   BUN 30 (*)  6 - 23 mg/dL   Creatinine, Ser 4.09  0.50 - 1.35 mg/dL   Calcium 8.6  8.4 - 81.1 mg/dL   GFR calc non Af Amer 51 (*) >90 mL/min   GFR calc Af Amer 59 (*) >90 mL/min   Comment:            The eGFR has been calculated     using the CKD EPI equation.     This calculation has not been     validated in all clinical     situations.     eGFR's persistently     <90 mL/min signify     possible Chronic Kidney Disease.  CBC     Status: Abnormal   Collection Time    07/06/12  6:10 AM      Result Value Range   WBC 11.4 (*) 4.0 - 10.5 K/uL   RBC 2.66 (*) 4.22 - 5.81 MIL/uL   Hemoglobin 7.8 (*) 13.0 - 17.0 g/dL   HCT 91.4 (*) 78.2 - 95.6 %   MCV 91.0  78.0 - 100.0 fL   MCH 29.3  26.0 - 34.0 pg   MCHC 32.2  30.0 - 36.0 g/dL   RDW 21.3  08.6 - 57.8 %   Platelets 312  150 - 400 K/uL  GLUCOSE, CAPILLARY     Status: Abnormal   Collection Time    07/06/12  7:46 AM      Result Value Range   Glucose-Capillary 101 (*) 70 - 99 mg/dL  TROPONIN I     Status: None   Collection Time    07/06/12 11:50 AM      Result Value Range   Troponin I <0.30  <0.30 ng/mL   Comment:            Due to the release kinetics of cTnI,     a negative result within the first hours     of the onset of symptoms does not rule out     myocardial infarction with certainty.     If myocardial infarction is still suspected,     repeat the test at appropriate intervals.  GLUCOSE, CAPILLARY     Status: Abnormal   Collection Time    07/06/12 11:58 AM      Result Value Range   Glucose-Capillary 160 (*) 70 - 99 mg/dL  GLUCOSE, CAPILLARY     Status: Abnormal   Collection Time    07/06/12  5:02 PM      Result Value Range   Glucose-Capillary 167 (*) 70 - 99 mg/dL  GLUCOSE, CAPILLARY     Status: Abnormal   Collection Time    07/06/12  9:36 PM      Result Value Range   Glucose-Capillary 182 (*) 70 - 99 mg/dL  CBC     Status: Abnormal   Collection Time    07/07/12  6:15 AM      Result Value Range   WBC 10.7 (*) 4.0 - 10.5  K/uL   RBC 2.53 (*) 4.22 - 5.81 MIL/uL   Hemoglobin 7.5 (*) 13.0 - 17.0 g/dL   HCT 46.9 (*) 62.9 - 52.8 %   MCV 90.9  78.0 - 100.0 fL   MCH 29.6  26.0 - 34.0 pg   MCHC 32.6  30.0 - 36.0 g/dL   RDW 41.3  24.4 - 01.0 %   Platelets 302  150 - 400  K/uL  BASIC METABOLIC PANEL     Status: Abnormal   Collection Time    07/07/12  6:15 AM      Result Value Range   Sodium 139  135 - 145 mEq/L   Potassium 4.4  3.5 - 5.1 mEq/L   Chloride 106  96 - 112 mEq/L   CO2 27  19 - 32 mEq/L   Glucose, Bld 91  70 - 99 mg/dL   BUN 30 (*) 6 - 23 mg/dL   Creatinine, Ser 1.61  0.50 - 1.35 mg/dL   Calcium 8.5  8.4 - 09.6 mg/dL   GFR calc non Af Amer 56 (*) >90 mL/min   GFR calc Af Amer 65 (*) >90 mL/min   Comment:            The eGFR has been calculated     using the CKD EPI equation.     This calculation has not been     validated in all clinical     situations.     eGFR's persistently     <90 mL/min signify     possible Chronic Kidney Disease.  GLUCOSE, CAPILLARY     Status: None   Collection Time    07/07/12  8:04 AM      Result Value Range   Glucose-Capillary 95  70 - 99 mg/dL   Comment 1 Notify RN      Imaging: Transthoracic Echocardiography  Patient: Meagan, Spease MR #: [id number] Study Date: 07/06/2012 Gender: M Age: 72 Height: 172.7cm Weight: 80.3kg BSA: 1.2m^2 Pt. Status: Room:  PERFORMING [facility] ATTENDING [place], Adeline ORDERING Viyuoh, Adeline ADMITTING Susa Raring SONOGRAPHER Arvil Chaco cc:  ------------------------------------------------------------ LV EF: 55% - 60%  ------------------------------------------------------------ Indications: Atrial fibrillation - 427.31.  ------------------------------------------------------------ History: PMH: Acute Renal Failure. Prostate Cancer. Hyperkalemia. Anemia. Hypoglycemia. Altered Mental Status. Chronic Hypertension. Chronic Diabetes. Chronic Diastolic Heart  Failure.  ------------------------------------------------------------ Study Conclusions  - Left ventricle: The cavity size was normal. Wall thickness was increased in a pattern of mild LVH. Systolic function was normal. The estimated ejection fraction was in the range of 55% to 60%. Wall motion was normal; there were no regional wall motion abnormalities. Doppler parameters are consistent with abnormal left ventricular relaxation (grade 1 diastolic dysfunction). - Atrial septum: There was redundancy of the septum, with borderline criteria for aneurysm. Transthoracic echocardiography. M-mode, complete 2D, spectral Doppler, and color Doppler. Height: Height: 172.7cm. Height: 68in. Weight: Weight: 80.3kg. Weight: 176.6lb. Body mass index: BMI: 26.9kg/m^2. Body surface area: BSA: 1.110m^2. Blood pressure: 119/47. Patient status: Inpatient.  ------------------------------------------------------------  ------------------------------------------------------------ Left ventricle: The cavity size was normal. Wall thickness was increased in a pattern of mild LVH. Systolic function was normal. The estimated ejection fraction was in the range of 55% to 60%. Wall motion was normal; there were no regional wall motion abnormalities. Doppler parameters are consistent with abnormal left ventricular relaxation (grade 1 diastolic dysfunction).  ------------------------------------------------------------ Aortic valve: Trileaflet; normal thickness leaflets. Mobility was not restricted. Doppler: Transvalvular velocity was within the normal range. There was no stenosis. No regurgitation.  ------------------------------------------------------------ Aorta: Aortic root: The aortic root was normal in size.  ------------------------------------------------------------ Mitral valve: Structurally normal valve. Mobility was not restricted. Doppler: Transvalvular velocity was within the normal range.  There was no evidence for stenosis. Trivial regurgitation. Peak gradient: 3mm Hg (D).  ------------------------------------------------------------ Left atrium: The atrium was normal in size.  ------------------------------------------------------------ Atrial septum: There was redundancy of the septum, with borderline criteria for aneurysm.  ------------------------------------------------------------ Right ventricle: The  cavity size was normal. Systolic function was normal.  ------------------------------------------------------------ Pulmonic valve: Doppler: Transvalvular velocity was within the normal range. There was no evidence for stenosis.  ------------------------------------------------------------ Tricuspid valve: Structurally normal valve. Doppler: Transvalvular velocity was within the normal range. Trivial regurgitation.  ------------------------------------------------------------ Right atrium: The atrium was normal in size.  ------------------------------------------------------------ Pericardium: There was no pericardial effusion.  ------------------------------------------------------------ Systemic veins: Inferior vena cava: The vessel was normal in size.  ------------------------------------------------------------  2D measurements Normal Doppler measurements Normal Left ventricle Left ventricle LVID ED, 48.4 mm 43-52 Ea, lat ann, 9.3 cm/s ------ chord, tiss DP 2 PLAX E/Ea, lat 8.5 ------ LVID ES, 15.5 mm 23-38 ann, tiss DP 3 chord, Ea, med ann, 6.3 cm/s ------ PLAX tiss DP 6 FS, 68 % >29 E/Ea, med 12. ------ chord, ann, tiss DP 5 PLAX Mitral valve LVPW, ED 11.76 mm ------ Peak E vel 79. cm/s ------ IVS/LVPW 1.12 <1.3 5 ratio, ED Peak A vel 98. cm/s ------ Ventricular septum 7 IVS, ED 13.22 mm ------ Deceleration 296 ms 150-23 Aorta time 0 Root 33 mm ------ Peak 3 mm ------ diam, ED gradient, D Hg Left atrium Peak E/A 0.8 ------ AP dim 39 mm  ------ ratio AP dim 2.01 cm/m^2 <2.2 Systemic veins index Estimated CVP 5 mm ------ Hg Right ventricle Sa vel, lat 16. cm/s ------ ann, tiss DP 4  ------------------------------------------------------------ Prepared and Electronically Authenticated by  Olga Millers [id number]:[id number]   Assessment:  1. Principal Problem: 2.   Acute renal failure 3. Active Problems: 4.   Prostate cancer 5.   Hyperkalemia 6.   Anemia 7.   Hypoglycemia 8.   Altered mental status 9.   HTN (hypertension) 10.   Diabetes 11.   Chronic diastolic heart failure 12.   Intertrochanteric fracture of left hip 13.   Atrial fibrillation with RVR 14.   Plan:  1. No chest pain. Remains in sinus. Plan for NST today to evaluate for large areas of reversible ischemia. H/H remain low at 7/23.  FOBT was negative. Iron studies consistent with iron-deficiency anemia. In the setting of AMI, would recommend transfusion if H/H < 7/21 or active chest pain.  Consider switching ferrous sulfate to niferex for better iron absorption.   Time Spent Directly with Patient:  15 minutes  Length of Stay:  LOS: 7 days   Chrystie Nose, MD, [facility] Attending Cardiologist [facility]  HILTY,Kenneth C 07/07/2012, 8:51 AM

## 2012-07-08 LAB — CBC
HCT: 23.8 % — ABNORMAL LOW (ref 39.0–52.0)
Hemoglobin: 7.8 g/dL — ABNORMAL LOW (ref 13.0–17.0)
MCHC: 32.8 g/dL (ref 30.0–36.0)
MCV: 91.2 fL (ref 78.0–100.0)
RDW: 14.3 % (ref 11.5–15.5)

## 2012-07-08 LAB — GLUCOSE, CAPILLARY
Glucose-Capillary: 187 mg/dL — ABNORMAL HIGH (ref 70–99)
Glucose-Capillary: 143 mg/dL — ABNORMAL HIGH (ref 70–99)

## 2012-07-08 MED ORDER — POLYSACCHARIDE IRON COMPLEX 150 MG PO CAPS
150.0000 mg | ORAL_CAPSULE | Freq: Every day | ORAL | Status: DC
  Administered 2012-07-08 – 2012-07-10 (×3): 150 mg via ORAL
  Filled 2012-07-08 (×3): qty 1

## 2012-07-08 NOTE — Progress Notes (Addendum)
[facility] PROGRESS NOTE  Dale Taylor NWG:[id number] DOB: [birth year] DOA: 06/30/2012 PCP: Provider Not In System Brief summary 72 y.o. 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 During that admission his creatinine was approximately 4, he also had hyperkalemia during that admission. Unknown what his renal function was on discharge. He was subsequently discharged to a skilled nursing facility for rehabilitation. Patient is a very poor historian, he is not exactly aware why he was transferred to [facility] emergency room today. His daughter was at bedside, apparently for the past few days while at the skilled nursing facility, patient has had worsening of his renal failure and hyperkalemia. This morning he was found to have altered mental status, and low blood sugars. Per daughter, she thinks her blood sugars were as low as in the 40s. He was then brought to the emergency room at [facility], a CT of the head was done which was negative for acute abnormalities, he was however found to have acute renal failure with hyperkalemia. The ED M.D. placed a left internal jugular line, apparently per the chest x-ray report, tip is in the left subclavian vein, apparently a right femoral jugular triple-lumen catheter was also placed.  The patient and the daughter but denied any nausea vomiting or diarrhea. Patient does complain of generalized body aches, but denies any chest pain or shortness of breath. The patient does complain of very poor appetite over the past few weeks.  He is being admitted to [facility] for further evaluation and treatment  of his hyperkalemia and acute renal failure. Patient renal function now back to baseline; needs SNF for discharge; active chest pain on 07/04/12; most likely GI in nature but with risk factors. On follow up on 6/25 pt in tachy-160 up to 180, with chest pain, EKG with afib,,and troponins elevated>>metoprolol>>cards consulted  Assessment/Plan:  Afib with RVR with Chest pain &Troponins elevated -pt given IVF, metoprolol on 6/25 -better rate control this am 6/26, continue metoprolol - dose increased per cards -echo results pending, follow -appreciate cards input - please see below regarding anemia; follow and transfuse if hh <7/21 - Lexiscan myoview done this am showing Large inferolateral scar/infarct  No findings for ischemia -Per cards ok to continue plavix and ASA, no coumadin/NOAC recommneded at this time -per cards 6/27 note transfuse if hh <7/21 Acute on chronic renal failure  - Suspect this is secondary to prerenal azotemia, medications (ACE inhibitor, Lasix and Aldactone) are the most likely culprits along with very poor oral intake. Patient  -continue indeterminately holding spironolactone and ACE inhibitor agents -CR normalized and remains stable , lasix was resumed 6/24- continue but with tachy above pt was given iv bolus as above on 6/25  . Hyperkalemia  - From acute renal failure, lisinopril and Aldactone therapy.  -resolved, follow   . Diabetes with Hypoglycemia  -This is due to acute renal failure and sulfonylurea use.  -will d/c Amaryl at discharge -stable. Will continue SSI while inpatient -at discharge discontinue any hypoglycemic agents and give a trial control with diet. A1C was 5.5 on 6/21 -no further hypoglycemia, follow  . Altered mental status  - Secondary to toxic metabolite encephalopathy from  hypoglycemia and acute renal failure. -continue improving and pretty much back to baseline -will monitor.  . Normocytic Anemia  - I suspect that this is a combination  secondary to acute blood loss from  recent hip fracture, chronic renal failure, CML and prostate Ca. There is no overt evidence of blood loss. -will continue iron repletion as recommended by renal -will continue to follow Hgb trend,  Hgb today 7.8 today- still no gross bleeding, pt is on ASA and plavix which will affect stool guaics if done at this time -continue niferex (for better absorption), follow  . HTN (hypertension)  -continue metoprolol, lasix and monitor -avoiding ACE and aldactone secondary to hyperkalemia this hosp stay  . Prostate cancer and urinary retention - History of prostate cancer status post radioactive seeding, follows with Dr. [name] at the cancer [facility]  - Will continue with Flomax and Ditropan   . Chronic diastolic heart failure  -History of chronic diastolic heart failure, currently clinically compensated.  -was monitor fluid status closely with ivf 6/26,  -continue lasix as above  . Recent hip fracture and pain all over - Will need SNF per PT rec's -will use PRN tramadol -LE's dopplers negative for DVT.  Marland Kitchen History of CML  - Is on chronic nilotinib therapy, follows up at the [facility] -continue nilotinib (family to bring medication)  . Intravenous access Issues  -IJ discontinue on 6/20 -femoral line discontinue on 6/22 -2 peripheral IV's in place  Moderate protein calorie malnutrition and hypoalbuminemia: -continue Nepro BID -encourage PO intake  DVT Prophylaxis: SCD's   Code Status: Full Family Communication: grandson at bedside Disposition Plan: medically ready for SNF when bed available  Consultants:  nephrology  Procedures:  See below for x-ray reports  Antibiotics:  none  HPI/Subjective: Patient denies chest pain, grandson at bedside  Objective: Filed Vitals:   07/07/12 1744 07/07/12 2138 07/08/12 0515 07/08/12 1000  BP: 140/58 155/57 136/64 134/60  Pulse: 73 73 65 72  Temp: 97.5 F (36.4 C) 98.2 F (36.8 C)  98.7 F (37.1 C) 98 F (36.7 C)  TempSrc: Oral Oral Oral Oral  Resp: 16 16 16 18   Height:      Weight:  75.433 kg (166 lb 4.8 oz)    SpO2: 100% 100% 98% 96%    Intake/Output Summary (Last 24 hours) at 07/08/12 1411 Last data filed at 07/08/12 0900  Gross per 24 hour  Intake    720 ml  Output   1150 ml  Net   -430 ml   Filed Weights   07/04/12 2105 07/06/12 2142 07/07/12 2138  Weight: 80.4 kg (177 lb 4 oz) 76.7 kg (169 lb 1.5 oz) 75.433 kg (166 lb 4.8 oz)    Exam:   General:  AAOX2; afebrile  Cardiovascular: S1 and S2, no rubs or gallops  Respiratory: decrease BS at bases, no wheezing  Abdomen: soft, NT, ND, positive BS  Musculoskeletal: Trace edema, no cyanosis  Data Reviewed: Basic Metabolic Panel:  Recent Labs Lab 07/02/12 0535 07/03/12 0555 07/04/12 0606 07/05/12 0235 07/06/12 0610 07/07/12 0615  NA 134* 138 138 135 140 139  K 5.4* 5.7* 5.8* 6.4* 4.4 4.4  CL 98 105 104 103 107 106  CO2 25 26 25 24 26 27   GLUCOSE 78 76 79 101* 97 91  BUN 87* 62* 46* 36* 30* 30*  CREATININE 3.81* 2.52* 2.07* 1.73* 1.35 1.26  CALCIUM 8.3* 8.6 8.8 8.9 8.6 8.5  MG  --   --  1.3*  --   --   --   PHOS 4.5  --   --   --   --   --    Liver Function Tests:  Recent Labs Lab 07/02/12 0535  ALBUMIN 2.6*   CBC:  Recent Labs Lab 07/02/12 0535  07/04/12 0606 07/05/12 0235 07/06/12 0610 07/07/12 0615 07/08/12 0540  WBC 10.1  < > 10.8* 16.2* 11.4* 10.7* 11.8*  NEUTROABS 6.5  --   --   --   --   --   --   HGB 7.8*  < > 7.9* 8.2* 7.8* 7.5* 7.8*  HCT 23.1*  < > 23.7* 24.5* 24.2* 23.0* 23.8*  MCV 88.2  < > 89.4 88.8 91.0 90.9 91.2  PLT 352  < > 336 296 312 302 327  < > = values in this interval not displayed. CBG:  Recent Labs Lab 07/07/12 1423 07/07/12 1618 07/07/12 2157 07/08/12 0809 07/08/12 1134  GLUCAP 113* 135* 157* 104* 187*    Recent Results (from the past 240 hour(s))  URINE CULTURE     Status: None   Collection Time    06/30/12  5:35 PM       Result Value Range Status   Specimen Description URINE, CATHETERIZED   Final   Special Requests NONE   Final   Culture  Setup Time 06/30/2012 18:38   Final   Colony Count NO GROWTH   Final   Culture NO GROWTH   Final   Report Status 07/01/2012 FINAL   Final  MRSA PCR SCREENING     Status: None   Collection Time    06/30/12  6:37 PM      Result Value Range Status   MRSA by PCR NEGATIVE  NEGATIVE Final   Comment:            The GeneXpert MRSA Assay (FDA     approved for NASAL specimens     only), is one component of a     comprehensive MRSA colonization     surveillance program. It is not     intended to diagnose MRSA     infection nor to guide or     monitor treatment for     MRSA infections.     Studies: Nm Myocar Multi W/spect W/wall Motion / Ef  07/07/2012   *RADIOLOGY REPORT*  Clinical Data:  Chest pain.  MYOCARDIAL IMAGING WITH SPECT (REST AND PHARMACOLOGIC-STRESS) GATED LEFT VENTRICULAR WALL MOTION STUDY LEFT VENTRICULAR EJECTION FRACTION  Technique:  Standard myocardial SPECT imaging was performed after resting intravenous injection of 10 mCi Tc-38m sestamibi. Subsequently, intravenous infusion of Lexiscan was performed under the supervision of the Cardiology staff.  At peak effect of the drug, 30 mCi Tc-62m sestamibi was injected intravenously and standard myocardial SPECT  imaging was performed.  Quantitative gated imaging was also performed to evaluate left ventricular wall motion, and estimate left ventricular ejection fraction.  Comparison:  None.  Findings: The left ventricle is dilated.  There is a large area of scar involving the inferior lateral wall with associated wall motion abnormality.  No reversible defects to suggest ischemia. The ejection fraction was calculated at 61%.  IMPRESSION:  1.  Large inferolateral scar/infarct 2.  No findings for ischemia. 3.  Calculated ejection fraction was 61%.   Original Report Authenticated By: Rudie Meyer, M.D.    Scheduled  Meds: . aspirin  81 mg Oral Q48H  . clopidogrel  75 mg Oral Daily  . famotidine  40 mg Oral Daily  . feeding supplement  237 mL Oral TID BM  . feeding supplement (NEPRO CARB STEADY)  237 mL Oral Q24H  . feeding supplement  1 Container Oral Q24H  . finasteride  5 mg Oral Daily  . furosemide  20 mg Oral Daily  . insulin aspart  0-9 Units Subcutaneous TID WC  . iron polysaccharides  150 mg Oral Daily  . lubiprostone  24 mcg Oral BID WC  . magnesium oxide  400 mg Oral Daily  . metoprolol  37.5 mg Oral BID  . nilotinib  300 mg Oral Q12H  . oxybutynin  5 mg Oral QHS  . simvastatin  40 mg Oral QHS  . sodium chloride  3 mL Intravenous Q12H  . sucralfate  1 g Oral TID WC & HS  . tamsulosin  0.4 mg Oral BID  . vitamin E  400 Units Oral BID   Continuous Infusions: . sodium chloride 10 mL/hr at 07/04/12 1623    Principal Problem:   Acute renal failure Active Problems:   Prostate cancer   Hyperkalemia   Iron deficiency anemia   Hypoglycemia   Altered mental status   HTN (hypertension)   Diabetes   Chronic diastolic heart failure   Intertrochanteric fracture of left hip   Atrial fibrillation with RVR    Time spent:   Kela Millin  [facility] Pager [phone]. If 7PM-7AM, please contact night-coverage at [email or web address], password [email or web address] 07/08/2012, 2:11 PM  LOS: 8 days

## 2012-07-08 NOTE — Progress Notes (Signed)
DAILY PROGRESS NOTE  Subjective:  No events overnight. Lexiscan showed LCX territory scar, but normal wall motion - this fixed defect could be explained by a paucity of lateral wall perfusion which could be a normal variant.  There was also mild anterior reversibility which was not commented on by radiology (however, I personally reviewed the images). He is denying chest pain. Overall, the study is low risk, albeit abnormal.  He continues to be anemic with some intrinsic renal disease.  Objective:  Temp:  [97.5 F (36.4 C)-99 F (37.2 C)] 98.7 F (37.1 C) (06/28 0515) Pulse Rate:  [65-81] 65 (06/28 0515) Resp:  [16-18] 16 (06/28 0515) BP: (113-160)/(34-64) 136/64 mmHg (06/28 0515) SpO2:  [98 %-100 %] 98 % (06/28 0515) Weight:  [166 lb 4.8 oz (75.433 kg)] 166 lb 4.8 oz (75.433 kg) (06/27 2138) Weight change: -2 lb 12.7 oz (-1.267 kg)  Intake/Output from previous day: 06/27 0701 - 06/28 0700 In: 480 [P.O.:480] Out: 800 [Urine:800]  Intake/Output from this shift:    Medications: Current Facility-Administered Medications  Medication Dose Route Frequency Provider Last Rate Last Dose  . 0.9 %  sodium chloride infusion   Intravenous Continuous Vassie Loll, MD 10 mL/hr at 07/04/12 1623    . acetaminophen (TYLENOL) tablet 650 mg  650 mg Oral Q6H PRN Maretta Bees, MD   650 mg at 07/06/12 1100   Or  . acetaminophen (TYLENOL) suppository 650 mg  650 mg Rectal Q6H PRN Shanker Levora Dredge, MD      . ipratropium (ATROVENT) nebulizer solution 0.5 mg  0.5 mg Nebulization TID PRN Kela Millin, MD       And  . albuterol (PROVENTIL) (5 MG/ML) 0.5% nebulizer solution 2.5 mg  2.5 mg Nebulization TID PRN Adeline C Viyuoh, MD      . aminophylline 80 mg in ns 20 mL (4 mg/mL) NUC MED syringe  80 mg Intravenous to XRAY Nada Boozer, NP      . aspirin chewable tablet 81 mg  81 mg Oral Q48H Maretta Bees, MD   81 mg at 07/06/12 1728  . clopidogrel (PLAVIX) tablet 75 mg  75 mg Oral Daily  Maretta Bees, MD   75 mg at 07/07/12 0957  . famotidine (PEPCID) tablet 40 mg  40 mg Oral Daily Vassie Loll, MD   40 mg at 07/07/12 0957  . feeding supplement (GLUCERNA SHAKE) liquid 237 mL  237 mL Oral TID BM Kela Millin, MD   237 mL at 07/07/12 2123  . feeding supplement (NEPRO CARB STEADY) liquid 237 mL  237 mL Oral Q24H Haynes Bast, RD   237 mL at 07/06/12 1307  . feeding supplement (RESOURCE BREEZE) liquid 1 Container  1 Container Oral Q24H Haynes Bast, RD   1 Container at 07/06/12 [phone]  . finasteride (PROSCAR) tablet 5 mg  5 mg Oral Daily Maretta Bees, MD   5 mg at 07/07/12 0957  . furosemide (LASIX) tablet 20 mg  20 mg Oral Daily Vassie Loll, MD   20 mg at 07/07/12 0957  . gi cocktail (Maalox,Lidocaine,Donnatal)  30 mL Oral TID PRN Vassie Loll, MD   30 mL at 07/04/12 1355  . insulin aspart (novoLOG) injection 0-9 Units  0-9 Units Subcutaneous TID WC Vassie Loll, MD   1 Units at 07/07/12 1703  . iron polysaccharides (NIFEREX) capsule 150 mg  150 mg Oral Daily Adeline C Viyuoh, MD      . lubiprostone (AMITIZA) capsule 24  mcg  24 mcg Oral BID WC Maretta Bees, MD   24 mcg at 07/08/12 0816  . magnesium oxide (MAG-OX) tablet 400 mg  400 mg Oral Daily Vassie Loll, MD   400 mg at 07/07/12 0958  . metoprolol tartrate (LOPRESSOR) tablet 37.5 mg  37.5 mg Oral BID Wilburt Finlay, PA-C   37.5 mg at 07/07/12 2123  . nilotinib (TASIGNA) capsule CAPS 300 mg  300 mg Oral Q12H Vassie Loll, MD   300 mg at 07/07/12 2123  . ondansetron (ZOFRAN) tablet 4 mg  4 mg Oral Q6H PRN Maretta Bees, MD   4 mg at 07/01/12 0357   Or  . ondansetron (ZOFRAN) injection 4 mg  4 mg Intravenous Q6H PRN Maretta Bees, MD      . oxybutynin (DITROPAN-XL) 24 hr tablet 5 mg  5 mg Oral QHS Maretta Bees, MD   5 mg at 07/07/12 2123  . simvastatin (ZOCOR) tablet 40 mg  40 mg Oral QHS Maretta Bees, MD   40 mg at 07/07/12 2123  . sodium chloride 0.9 % injection 3 mL  3 mL  Intravenous Q12H Maretta Bees, MD   3 mL at 07/07/12 2200  . sucralfate (CARAFATE) 1 GM/10ML suspension 1 g  1 g Oral TID WC & HS Vassie Loll, MD   1 g at 07/08/12 0816  . tamsulosin (FLOMAX) capsule 0.4 mg  0.4 mg Oral BID Maretta Bees, MD   0.4 mg at 07/07/12 2123  . traMADol (ULTRAM) tablet 50 mg  50 mg Oral Q6H PRN Vassie Loll, MD   50 mg at 07/06/12 2144  . vitamin E capsule 400 Units  400 Units Oral BID Maretta Bees, MD   400 Units at 07/07/12 2123    Physical Exam: General appearance: alert and no distress Neck: no adenopathy, no carotid bruit, no JVD, supple, symmetrical, trachea midline and thyroid not enlarged, symmetric, no tenderness/mass/nodules Lungs: clear to auscultation bilaterally Heart: regular rate and rhythm, S1, S2 normal, no murmur, click, rub or gallop Abdomen: soft, non-tender; bowel sounds normal; no masses,  no organomegaly Extremities: extremities normal, atraumatic, no cyanosis or edema Pulses: 2+ and symmetric Skin: Skin color, texture, turgor normal. No rashes or lesions Neurologic: Mental status: Alert, follows commands  Lab Results: Results for orders placed during the hospital encounter of 06/30/12 (from the past 48 hour(s))  TROPONIN I     Status: None   Collection Time    07/06/12 11:50 AM      Result Value Range   Troponin I <0.30  <0.30 ng/mL   Comment:            Due to the release kinetics of cTnI,     a negative result within the first hours     of the onset of symptoms does not rule out     myocardial infarction with certainty.     If myocardial infarction is still suspected,     repeat the test at appropriate intervals.  GLUCOSE, CAPILLARY     Status: Abnormal   Collection Time    07/06/12 11:58 AM      Result Value Range   Glucose-Capillary 160 (*) 70 - 99 mg/dL  GLUCOSE, CAPILLARY     Status: Abnormal   Collection Time    07/06/12  5:02 PM      Result Value Range   Glucose-Capillary 167 (*) 70 - 99 mg/dL    GLUCOSE, CAPILLARY  Status: Abnormal   Collection Time    07/06/12  9:36 PM      Result Value Range   Glucose-Capillary 182 (*) 70 - 99 mg/dL  CBC     Status: Abnormal   Collection Time    07/07/12  6:15 AM      Result Value Range   WBC 10.7 (*) 4.0 - 10.5 K/uL   RBC 2.53 (*) 4.22 - 5.81 MIL/uL   Hemoglobin 7.5 (*) 13.0 - 17.0 g/dL   HCT 16.1 (*) 09.6 - 04.5 %   MCV 90.9  78.0 - 100.0 fL   MCH 29.6  26.0 - 34.0 pg   MCHC 32.6  30.0 - 36.0 g/dL   RDW 40.9  81.1 - 91.4 %   Platelets 302  150 - 400 K/uL  BASIC METABOLIC PANEL     Status: Abnormal   Collection Time    07/07/12  6:15 AM      Result Value Range   Sodium 139  135 - 145 mEq/L   Potassium 4.4  3.5 - 5.1 mEq/L   Chloride 106  96 - 112 mEq/L   CO2 27  19 - 32 mEq/L   Glucose, Bld 91  70 - 99 mg/dL   BUN 30 (*) 6 - 23 mg/dL   Creatinine, Ser 7.82  0.50 - 1.35 mg/dL   Calcium 8.5  8.4 - 95.6 mg/dL   GFR calc non Af Amer 56 (*) >90 mL/min   GFR calc Af Amer 65 (*) >90 mL/min   Comment:            The eGFR has been calculated     using the CKD EPI equation.     This calculation has not been     validated in all clinical     situations.     eGFR's persistently     <90 mL/min signify     possible Chronic Kidney Disease.  GLUCOSE, CAPILLARY     Status: None   Collection Time    07/07/12  8:04 AM      Result Value Range   Glucose-Capillary 95  70 - 99 mg/dL   Comment 1 Notify RN    GLUCOSE, CAPILLARY     Status: Abnormal   Collection Time    07/07/12  2:23 PM      Result Value Range   Glucose-Capillary 113 (*) 70 - 99 mg/dL  GLUCOSE, CAPILLARY     Status: Abnormal   Collection Time    07/07/12  4:18 PM      Result Value Range   Glucose-Capillary 135 (*) 70 - 99 mg/dL   Comment 1 Notify RN    GLUCOSE, CAPILLARY     Status: Abnormal   Collection Time    07/07/12  9:57 PM      Result Value Range   Glucose-Capillary 157 (*) 70 - 99 mg/dL  CBC     Status: Abnormal   Collection Time    07/08/12  5:40 AM       Result Value Range   WBC 11.8 (*) 4.0 - 10.5 K/uL   RBC 2.61 (*) 4.22 - 5.81 MIL/uL   Hemoglobin 7.8 (*) 13.0 - 17.0 g/dL   HCT 21.3 (*) 08.6 - 57.8 %   MCV 91.2  78.0 - 100.0 fL   MCH 29.9  26.0 - 34.0 pg   MCHC 32.8  30.0 - 36.0 g/dL   RDW 46.9  62.9 - 52.8 %   Platelets  327  150 - 400 K/uL  GLUCOSE, CAPILLARY     Status: Abnormal   Collection Time    07/08/12  8:09 AM      Result Value Range   Glucose-Capillary 104 (*) 70 - 99 mg/dL    Imaging: Nm Myocar Multi W/spect W/wall Motion / Ef  07/07/2012   *RADIOLOGY REPORT*  Clinical Data:  Chest pain.  MYOCARDIAL IMAGING WITH SPECT (REST AND PHARMACOLOGIC-STRESS) GATED LEFT VENTRICULAR WALL MOTION STUDY LEFT VENTRICULAR EJECTION FRACTION  Technique:  Standard myocardial SPECT imaging was performed after resting intravenous injection of 10 mCi Tc-71m sestamibi. Subsequently, intravenous infusion of Lexiscan was performed under the supervision of the Cardiology staff.  At peak effect of the drug, 30 mCi Tc-45m sestamibi was injected intravenously and standard myocardial SPECT  imaging was performed.  Quantitative gated imaging was also performed to evaluate left ventricular wall motion, and estimate left ventricular ejection fraction.  Comparison:  None.  Findings: The left ventricle is dilated.  There is a large area of scar involving the inferior lateral wall with associated wall motion abnormality.  No reversible defects to suggest ischemia. The ejection fraction was calculated at 61%.  IMPRESSION:  1.  Large inferolateral scar/infarct 2.  No findings for ischemia. 3.  Calculated ejection fraction was 61%.   Original Report Authenticated By: Rudie Meyer, M.D.    Assessment:  1. Principal Problem: 2.   Acute renal failure 3. Active Problems: 4.   Prostate cancer 5.   Hyperkalemia 6.   Iron deficiency anemia 7.   Hypoglycemia 8.   Altered mental status 9.   HTN (hypertension) 10.   Diabetes 11.   Chronic diastolic heart  failure 12.   Intertrochanteric fracture of left hip 13.   Atrial fibrillation with RVR 14.   Plan:  1. He continues to maintain sinus rhythm. No chest pain. The stress test was low risk, albeit abnormal with borderline anterior reversibility and LCx territory infarct or fixed hypoperfusion (possible anatomic variant - as wall motion in this area is normal and EF is preserved). I suspect he does have underlying CAD, however, it is not clear to what extent. He continues to be weak and will likely need to go back to rehab.  ?whether transfusing him up will help with energy and exertional fatigue. His anemia is likely due to chronic disease, renal failure + iron deficiency. Chronic blood loss anemia cannot be excluded. I don't feel he is a good long-term anticoagulation candidate for his a-fib with warfarin or a NOAC, despite an elevated CHADS2VASC score of 4, his HAS-BLED score is 4 as well - which predicts a 3-4% annual risk of stroke, but a 9% annual risk of serious bleeding. He has persistent anemia and may have underlying GI blood loss we are not aware of. There is little additional benefit of plavix + aspirin for stroke prevention over aspirin alone, but a large bleeding risk. I suspect the plavix was added for his PAD. As for troponin elevation, it was mild and likely represents demand ischemia in the setting of a-fib with RVR. This does suggest possible underlying obstructive CAD, however, with lexiscan, no significant high-risk ischemic territories were idenitified.  Aspirin and plavix are good medical therapy for CAD and PAD - so I would keep both going at discharge.  He wishes to follow-up with his cardiologist in [place].  We will sign-off. Please call with questions.  Time Spent Directly with Patient:  15 minutes  Length of Stay:  LOS:  8 days   Chrystie Nose, MD, [facility] Attending Cardiologist [facility]  Lynnell Fiumara C 07/08/2012, 8:46 AM

## 2012-07-08 NOTE — Clinical Social Work Note (Signed)
CSW received consult for patient as he is from a skilled nursing facility, [facility] skilled nursing facility in [place]. Clinicals sent to facility and also to [facility] for authorization when patient ready for discharge. Full assessment to follow.  Genelle Bal, MSW, LCSW [phone]

## 2012-07-09 LAB — CBC
Hemoglobin: 7.6 g/dL — ABNORMAL LOW (ref 13.0–17.0)
MCV: 89.8 fL (ref 78.0–100.0)
Platelets: 289 10*3/uL (ref 150–400)
RBC: 2.56 MIL/uL — ABNORMAL LOW (ref 4.22–5.81)
WBC: 9.9 10*3/uL (ref 4.0–10.5)

## 2012-07-09 LAB — BASIC METABOLIC PANEL
CO2: 28 mEq/L (ref 19–32)
Chloride: 104 mEq/L (ref 96–112)
Glucose, Bld: 109 mg/dL — ABNORMAL HIGH (ref 70–99)
Sodium: 137 mEq/L (ref 135–145)

## 2012-07-09 LAB — GLUCOSE, CAPILLARY: Glucose-Capillary: 109 mg/dL — ABNORMAL HIGH (ref 70–99)

## 2012-07-09 NOTE — Progress Notes (Addendum)
[facility] PROGRESS NOTE  Dale Taylor ZOX:[id number] DOB: 06-Jul-1940 DOA: 06/30/2012 PCP: Provider Not In System Brief summary 72 y.o. 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 During that admission his creatinine was approximately 4, he also had hyperkalemia during that admission. Unknown what his renal function was on discharge. He was subsequently discharged to a skilled nursing facility for rehabilitation. Patient is a very poor historian, he is not exactly aware why he was transferred to [facility] emergency room today. His daughter was at bedside, apparently for the past few days while at the skilled nursing facility, patient has had worsening of his renal failure and hyperkalemia. This morning he was found to have altered mental status, and low blood sugars. Per daughter, she thinks her blood sugars were as low as in the 40s. He was then brought to the emergency room at [facility], a CT of the head was done which was negative for acute abnormalities, he was however found to have acute renal failure with hyperkalemia. The ED M.D. placed a left internal jugular line, apparently per the chest x-ray report, tip is in the left subclavian vein, apparently a right femoral jugular triple-lumen catheter was also placed.  The patient and the daughter but denied any nausea vomiting or diarrhea. Patient does complain of generalized body aches, but denies any chest pain or shortness of breath. The patient does complain of very poor appetite over the past few weeks.  He is being admitted to [facility] for further evaluation and treatment  of his hyperkalemia and acute renal failure. Patient renal function now back to baseline; needs SNF for discharge; active chest pain on 07/04/12; most likely GI in nature but with risk factors. On follow up on 6/25 pt in tachy-160 up to 180, with chest pain, EKG with afib,,and troponins elevated>>metoprolol>>cards consulted  Assessment/Plan:  Afib with RVR with Chest pain &Troponins elevated -pt given IVF, metoprolol on 6/25 -better rate control this am 6/26, continue metoprolol - dose increased per cards -echo results pending, follow -appreciate cards input - please see below regarding anemia; follow and transfuse if hh <7/21 - Lexiscan myoview done this am showing Large inferolateral scar/infarct  No findings for ischemia -Per cards ok to continue plavix and ASA, no coumadin/NOAC recommneded at this time -per cards 6/27 note transfuse if hh <7/21 Acute on chronic renal failure  - Suspect this is secondary to prerenal azotemia, medications (ACE inhibitor, Lasix and Aldactone) are the most likely culprits along with very poor oral intake. Patient  -continue indeterminately holding spironolactone and ACE inhibitor agents -CR normalized and remains stable , lasix was resumed 6/24- continue but with tachy above pt was given iv bolus as above on 6/25  . Hyperkalemia  - From acute renal failure, lisinopril and Aldactone therapy.  -resolved, follow   . Diabetes with Hypoglycemia  -This is due to acute renal failure and sulfonylurea use.  -will d/c Amaryl at discharge -stable. Will continue SSI while inpatient -at discharge discontinue any hypoglycemic agents and give a trial control with diet. A1C was 5.5 on 6/21 -no further hypoglycemia, follow  . Altered mental status  - Secondary to toxic metabolite encephalopathy from  hypoglycemia and acute renal failure. -improved, back to baseline   . Normocytic Anemia  - I suspect that this is a combination secondary to acute blood loss from   recent hip fracture, chronic renal failure, CML and prostate Ca. There is no overt evidence of blood loss. -will continue iron repletion as recommended by renal -will continue to follow Hgb trend,  Hgb today 7.6 today- still no gross bleeding, pt is on ASA and plavix which will affect stool guaics if done at this time -continue niferex (for better absorption), follow  . HTN (hypertension)  -continue metoprolol, lasix and monitor -avoiding ACE and aldactone secondary to hyperkalemia this hosp stay  . Prostate cancer and urinary retention - History of prostate cancer status post radioactive seeding, follows with Dr. [name] at the cancer [facility]  - Will continue with Flomax and Ditropan   . Chronic diastolic heart failure  -History of chronic diastolic heart failure, currently clinically compensated.  -was monitor fluid status closely with ivf 6/26,  -continue lasix as above  . Recent hip fracture and pain all over - Will need SNF per PT rec's -will use PRN tramadol -LE's dopplers negative for DVT.  Marland Kitchen History of CML  - Is on chronic nilotinib therapy, follows up at the [facility] -continue nilotinib (family to bring medication)  . Intravenous access Issues  -IJ discontinue on 6/20 -femoral line discontinue on 6/22 -2 peripheral IV's in place  Moderate protein calorie malnutrition and hypoalbuminemia: -continue Nepro BID -encourage PO intake  DVT Prophylaxis: SCD's   Code Status: Full Family Communication: none at bedside Disposition Plan: medically ready for SNF when bed available  Consultants:  nephrology  Procedures:  See below for x-ray reports  Antibiotics:  none  HPI/Subjective: Patient denies any c/o, states better appetite and beginning to feel stronger  Objective: Filed Vitals:   07/08/12 1700 07/08/12 2037 07/09/12 0539 07/09/12 1000  BP: 79/41 152/63 152/84 158/58  Pulse: 68 70 62 65  Temp: 98 F (36.7 C) 98.7 F (37.1 C) 98.2 F  (36.8 C) 98.6 F (37 C)  TempSrc: Oral Oral Oral Oral  Resp: 18 18 18 18   Height:      Weight:      SpO2: 87% 99% 99% 98%    Intake/Output Summary (Last 24 hours) at 07/09/12 1238 Last data filed at 07/09/12 0916  Gross per 24 hour  Intake    540 ml  Output   1400 ml  Net   -860 ml   Filed Weights   07/04/12 2105 07/06/12 2142 07/07/12 2138  Weight: 80.4 kg (177 lb 4 oz) 76.7 kg (169 lb 1.5 oz) 75.433 kg (166 lb 4.8 oz)    Exam:   General:  AAOX2; afebrile  Cardiovascular: S1 and S2, no rubs or gallops  Respiratory: decrease BS at bases, no wheezing, no crackles  Abdomen: soft, NT, ND, positive BS  Musculoskeletal: no edema, no cyanosis  Data Reviewed: Basic Metabolic Panel:  Recent Labs Lab 07/03/12 0555 07/04/12 0606 07/05/12 0235 07/06/12 0610 07/07/12 0615 07/09/12 0505  NA 138 138 135 140 139 137  K 5.7* 5.8* 6.4* 4.4 4.4 4.0  CL 105 104 103 107 106 104  CO2 26 25 24 26 27 28   GLUCOSE 76 79 101* 97 91 109*  BUN 62* 46* 36* 30* 30* 25*  CREATININE 2.52* 2.07* 1.73* 1.35 1.26 1.01  CALCIUM 8.6 8.8 8.9 8.6 8.5 8.3*  MG  --  1.3*  --   --   --   --  Liver Function Tests: No results found for this basename: AST, ALT, ALKPHOS, BILITOT, PROT, ALBUMIN,  in the last 168 hours CBC:  Recent Labs Lab 07/05/12 0235 07/06/12 0610 07/07/12 0615 07/08/12 0540 07/09/12 0505  WBC 16.2* 11.4* 10.7* 11.8* 9.9  HGB 8.2* 7.8* 7.5* 7.8* 7.6*  HCT 24.5* 24.2* 23.0* 23.8* 23.0*  MCV 88.8 91.0 90.9 91.2 89.8  PLT 296 312 302 327 289   CBG:  Recent Labs Lab 07/08/12 0809 07/08/12 1134 07/08/12 1627 07/08/12 2128 07/09/12 0756  GLUCAP 104* 187* 143* 143* 109*    Recent Results (from the past 240 hour(s))  URINE CULTURE     Status: None   Collection Time    06/30/12  5:35 PM      Result Value Range Status   Specimen Description URINE, CATHETERIZED   Final   Special Requests NONE   Final   Culture  Setup Time 06/30/2012 18:38   Final   Colony  Count NO GROWTH   Final   Culture NO GROWTH   Final   Report Status 07/01/2012 FINAL   Final  MRSA PCR SCREENING     Status: None   Collection Time    06/30/12  6:37 PM      Result Value Range Status   MRSA by PCR NEGATIVE  NEGATIVE Final   Comment:            The GeneXpert MRSA Assay (FDA     approved for NASAL specimens     only), is one component of a     comprehensive MRSA colonization     surveillance program. It is not     intended to diagnose MRSA     infection nor to guide or     monitor treatment for     MRSA infections.     Studies: Nm Myocar Multi W/spect W/wall Motion / Ef  07/07/2012   *RADIOLOGY REPORT*  Clinical Data:  Chest pain.  MYOCARDIAL IMAGING WITH SPECT (REST AND PHARMACOLOGIC-STRESS) GATED LEFT VENTRICULAR WALL MOTION STUDY LEFT VENTRICULAR EJECTION FRACTION  Technique:  Standard myocardial SPECT imaging was performed after resting intravenous injection of 10 mCi Tc-105m sestamibi. Subsequently, intravenous infusion of Lexiscan was performed under the supervision of the Cardiology staff.  At peak effect of the drug, 30 mCi Tc-60m sestamibi was injected intravenously and standard myocardial SPECT  imaging was performed.  Quantitative gated imaging was also performed to evaluate left ventricular wall motion, and estimate left ventricular ejection fraction.  Comparison:  None.  Findings: The left ventricle is dilated.  There is a large area of scar involving the inferior lateral wall with associated wall motion abnormality.  No reversible defects to suggest ischemia. The ejection fraction was calculated at 61%.  IMPRESSION:  1.  Large inferolateral scar/infarct 2.  No findings for ischemia. 3.  Calculated ejection fraction was 61%.   Original Report Authenticated By: Rudie Meyer, M.D.    Scheduled Meds: . aspirin  81 mg Oral Q48H  . clopidogrel  75 mg Oral Daily  . famotidine  40 mg Oral Daily  . feeding supplement  237 mL Oral TID BM  . feeding supplement (NEPRO  CARB STEADY)  237 mL Oral Q24H  . feeding supplement  1 Container Oral Q24H  . finasteride  5 mg Oral Daily  . furosemide  20 mg Oral Daily  . insulin aspart  0-9 Units Subcutaneous TID WC  . iron polysaccharides  150 mg Oral Daily  . lubiprostone  24  mcg Oral BID WC  . magnesium oxide  400 mg Oral Daily  . metoprolol  37.5 mg Oral BID  . nilotinib  300 mg Oral Q12H  . oxybutynin  5 mg Oral QHS  . simvastatin  40 mg Oral QHS  . sodium chloride  3 mL Intravenous Q12H  . sucralfate  1 g Oral TID WC & HS  . tamsulosin  0.4 mg Oral BID  . vitamin E  400 Units Oral BID   Continuous Infusions: . sodium chloride 10 mL/hr at 07/04/12 1623    Principal Problem:   Acute renal failure Active Problems:   Prostate cancer   Hyperkalemia   Iron deficiency anemia   Hypoglycemia   Altered mental status   HTN (hypertension)   Diabetes   Chronic diastolic heart failure   Intertrochanteric fracture of left hip   Atrial fibrillation with RVR    Time spent:   Kela Millin  [facility] Pager [phone]. If 7PM-7AM, please contact night-coverage at [email or web address], password [facility] 07/09/2012, 12:38 PM  LOS: 9 days

## 2012-07-10 DIAGNOSIS — D509 Iron deficiency anemia, unspecified: Secondary | ICD-10-CM

## 2012-07-10 LAB — CBC
HCT: 23.2 % — ABNORMAL LOW (ref 39.0–52.0)
Hemoglobin: 7.6 g/dL — ABNORMAL LOW (ref 13.0–17.0)
MCV: 90.6 fL (ref 78.0–100.0)
WBC: 9 10*3/uL (ref 4.0–10.5)

## 2012-07-10 LAB — GLUCOSE, CAPILLARY
Glucose-Capillary: 147 mg/dL — ABNORMAL HIGH (ref 70–99)
Glucose-Capillary: 126 mg/dL — ABNORMAL HIGH (ref 70–99)

## 2012-07-10 MED ORDER — ALLOPURINOL 300 MG PO TABS: 150.0000 mg | ORAL_TABLET | Freq: Every day | ORAL | Status: AC

## 2012-07-10 MED ORDER — GLUCERNA SHAKE PO LIQD: 237.0000 mL | Freq: Every day | ORAL | Status: AC

## 2012-07-10 MED ORDER — POLYSACCHARIDE IRON COMPLEX 150 MG PO CAPS: 150.0000 mg | ORAL_CAPSULE | Freq: Every day | ORAL | Status: AC

## 2012-07-10 MED ORDER — MAGNESIUM OXIDE 400 (241.3 MG) MG PO TABS: 400.0000 mg | ORAL_TABLET | Freq: Every day | ORAL | Status: AC

## 2012-07-10 MED ORDER — METOPROLOL TARTRATE 12.5 MG HALF TABLET: 37.5000 mg | ORAL_TABLET | Freq: Two times a day (BID) | ORAL | Status: AC

## 2012-07-10 MED ORDER — ENSURE PUDDING PO PUDG: 1.0000 | Freq: Three times a day (TID) | ORAL | Status: AC

## 2012-07-10 MED ORDER — TRAMADOL HCL 50 MG PO TABS: 50.0000 mg | ORAL_TABLET | Freq: Four times a day (QID) | ORAL | Status: AC | PRN

## 2012-07-10 MED ORDER — ACETAMINOPHEN 325 MG PO TABS: 650.0000 mg | ORAL_TABLET | Freq: Four times a day (QID) | ORAL | Status: AC | PRN

## 2012-07-10 MED ORDER — FAMOTIDINE 40 MG PO TABS: 40.0000 mg | ORAL_TABLET | Freq: Every day | ORAL | Status: AC

## 2012-07-10 MED ORDER — ENSURE PUDDING PO PUDG
1.0000 | Freq: Three times a day (TID) | ORAL | Status: DC
  Administered 2012-07-10: 1 via ORAL

## 2012-07-10 MED ORDER — GLUCERNA SHAKE PO LIQD: 237.0000 mL | Freq: Every day | ORAL | Status: DC

## 2012-07-10 MED ORDER — SUCRALFATE 1 GM/10ML PO SUSP: 1.0000 g | Freq: Three times a day (TID) | ORAL | Status: AC

## 2012-07-10 NOTE — Progress Notes (Signed)
Physical Therapy Treatment Patient Details Name: Aniello Christopoulos MRN: [id number] DOB: 04-11-1940 Today's Date: 07/10/2012 Time: 4098-1191 PT Time Calculation (min): 32 min  PT Assessment / Plan / Recommendation  PT Comments   Pt assisted into bathroom requiring min assist with toilet transfers and then ambulated in hallway with RW.  Pt reports his RW in room is borrowed (too short for him) from sister and he would like RW upon d/c (states SNF did not have any available for him to use?).  Follow Up Recommendations  SNF     Does the patient have the potential to tolerate intense rehabilitation     Barriers to Discharge        Equipment Recommendations  Rolling walker with 5" wheels (would like upon d/c to use at SNF)    Recommendations for Other Services    Frequency     Progress towards PT Goals Progress towards PT goals: Goals updated - see care plan  Plan Current plan remains appropriate    Precautions / Restrictions Precautions Precautions: Fall Restrictions Other Position/Activity Restrictions: L LE WB status unknown, pt reports no pain with ambulating, encouraged use of UEs through RW to decrease WB   Pertinent Vitals/Pain No c/o pain during session    Mobility  Bed Mobility Bed Mobility: Sit to Supine Sit to Supine: 5: Supervision;HOB flat Details for Bed Mobility Assistance: increased time, used UEs to assist LEs onto bed Transfers Transfers: Sit to Stand;Stand to Sit Sit to Stand: 4: Min assist;From toilet;From chair/3-in-1;With upper extremity assist Stand to Sit: 4: Min assist;To toilet;To bed;With upper extremity assist Details for Transfer Assistance: verbal cues for safe technique, min assist from low toilet, min/guard from chair and to bed Ambulation/Gait Ambulation/Gait Assistance: 4: Min guard Ambulation Distance (Feet): 160 Feet Assistive device: Rolling walker Ambulation/Gait Assistance Details: pt reports no pain with ambulation, stated he had mild dizzy  episode upon sitting but when questioned he stated it was very brief. Gait Pattern: Step-through pattern;Decreased stride length;Trunk flexed Gait velocity: decreased General Gait Details: pt reports fatigue only limiting factor, maintained 2L O2 during ambulation, noticed pt's RW in room too short so discussed need for taller walker, pt states none available at SNF and would like one upon d/c (pt borrowing RW from sister)    Exercises General Exercises - Lower Extremity Long Arc Quad: AROM;Both;10 reps;Seated Hip ABduction/ADduction: AROM;Both;10 reps;Supine Hip Flexion/Marching: AROM;10 reps;Seated;Both   PT Diagnosis:    PT Problem List:   PT Treatment Interventions:     PT Goals (current goals can now be found in the care plan section)    Visit Information  Last PT Received On: 07/10/12 Assistance Needed: +1    Subjective Data      Cognition  Cognition Arousal/Alertness: Awake/alert Overall Cognitive Status: Within Functional Limits for tasks assessed    Balance     End of Session PT - End of Session Activity Tolerance: Patient tolerated treatment well Patient left: with call bell/phone within reach;in bed;with bed alarm set   GP     Marrianne Sica,KATHrine E 07/10/2012, 3:15 PM Zenovia Jarred, PT, DPT 07/10/2012 Pager: [phone]

## 2012-07-10 NOTE — Clinical Social Work Psychosocial (Addendum)
Clinical Social Work Department BRIEF PSYCHOSOCIAL ASSESSMENT AND DISCHARGE NOTE 07/10/2012  Patient:  MAXIMILLIANO, KERSH     Account Number:  [id number]     Admit date:  06/30/2012  Clinical Social Worker:  Delmer Islam  Date/Time:  07/10/2012 06:40 AM  Referred by:  Physician  Date Referred:  07/03/2012 Referred for  SNF Placement   Other Referral:   Interview type:  Other - See comment Other interview type:   CSW talked with admissions staff at [facility]    PSYCHOSOCIAL DATA Living Status:  FACILITY Admitted from facility:  [facility] Level of care:  Skilled Nursing Facility Primary support name:  Ethen Bannan Primary support relationship to patient:  SPOUSE Degree of support available:    CURRENT CONCERNS Current Concerns  Post-Acute Placement   Other Concerns:    SOCIAL WORK ASSESSMENT / PLAN Patient from [facility] where he is receiving rehab services and will return. CSW talked with admissions staff and patient's payor is workman's compensation.   Assessment/plan status:  No Further Intervention Required Other assessment/ plan:   Information/referral to community resources:    PATIENT'S/FAMILY'S RESPONSE TO PLAN OF CARE: 07/10/12 - Patient dishcarging back to [facility] SNF to continue his rehabilitation. Patient transported by ambulance. CSW contacted wife, Asriel Westrup ([phone]) and advised her of discharge back to facility and ambulance transport.

## 2012-07-10 NOTE — Progress Notes (Addendum)
Pt had a 4 beat run non-sustained VT. Asymptomatic. Resting comfortably in bed. MD notified.

## 2012-07-10 NOTE — Discharge Summary (Signed)
Physician Discharge Summary  Dale Taylor ZOX:[id number] DOB: 02/21/1940 DOA: 06/30/2012  PCP: Provider Not In System  Admit date: 06/30/2012 Discharge date: 07/10/2012  Time spent: >30 minutes  Recommendations for Outpatient Follow-up:      Follow-up Information   Please follow up. (SNF in one to 2 days)       Please follow up. (His cardiologist in [place]  in one to 2 weeks, call for appointment upon discharge)       Discharge Diagnoses:  Principal Problem:   Acute renal failure Active Problems:   Prostate cancer   Hyperkalemia   Iron deficiency anemia   Hypoglycemia   Altered mental status   HTN (hypertension)   Diabetes   Chronic diastolic heart failure   Intertrochanteric fracture of left hip   Atrial fibrillation with RVR   Discharge Condition: Improved/stable  Diet recommendation: Modified carbohydrate diet  Filed Weights   07/04/12 2105 07/06/12 2142 07/07/12 2138  Weight: 80.4 kg (177 lb 4 oz) 76.7 kg (169 lb 1.5 oz) 75.433 kg (166 lb 4.8 oz)    History of present illness:  72 y.o. 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 During that admission his creatinine was approximately 4, he also had hyperkalemia during that admission. Unknown what his renal function was on discharge. He was subsequently discharged to a skilled nursing facility for rehabilitation. Patient is a very poor historian, he is not exactly aware why he was transferred to [facility] emergency room today. His daughter was at bedside, apparently for the past few days while at the skilled nursing facility, patient has had worsening of his renal  failure and hyperkalemia. This morning he was found to have altered mental status, and low blood sugars. Per daughter, she thinks her blood sugars were as low as in the 40s. He was then brought to the emergency room at [facility], a CT of the head was done which was negative for acute abnormalities, he was however found to have acute renal failure with hyperkalemia. The ED M.D. placed a left internal jugular line, apparently per the chest x-ray report, tip is in the left subclavian vein, apparently a right femoral jugular triple-lumen catheter was also placed.  The patient and the daughter but denied any nausea vomiting or diarrhea. Patient does complain of generalized body aches, but denies any chest pain or shortness of breath. The patient does complain of very poor appetite over the past few weeks.  He is being admitted to [facility] for further evaluation and treatment of his hyperkalemia and acute renal failure.  Patient renal function now back to baseline; needs SNF for discharge; active chest pain on 07/04/12; most likely GI in nature but with risk factors. On follow up on 6/25 pt in tachy-160 up to 180, with chest pain, EKG with afib,,and troponins elevated>>metoprolol>>cards consulted      Hospital Course:  Afib with RVR with Chest pain &Troponins elevated  -During this hospitalization patient on 6/25 became tachycardic as discussed above- given IVF, EKG was consistent with A. fib with RVR he was given IV metoprolol, and his metoprolol was restarted at an increased dose  -Cardiology was consulted and followed up  on patient and his metoprolol dose was further increased from 25->37.5 mg twice a day for better rate control -Troponins were cycled and were mildly elevated, patient did not have any further chest pain the initial episode of onset of -Cardiology recommended Lexi scan Myoview which was done and showed no findings for ischemia, scar was noted. -Per cards ok to continue  plavix and ASA, no coumadin/NOAC recommneded at this time  -Patient had some anemia and cardiology followed and recommended to transfuse if hemoglobin less than 7/21, but his hemoglobin remained stable-last at 7.6 prior to discharge. Acute on chronic renal failure  - Suspect this is secondary to prerenal azotemia, medications (ACE inhibitor, Lasix and Aldactone) are the most likely culprits along with very poor oral intake. Patient was gently hydrated  -Pt's spironolactone and ACE inhibitor agents were held and given his hperkalemia while in th hospital these have been dc'ed   -CR normalized with above and remains stable , lasix was resumed 6/24- and his creatinine has remained within normal limits. His to followup with nursing home M.D. for further monitoring and management as appropriate. -She was seen there renal-Dr. done and this hospital stay and recommended stopping the spironolactone and lisinopril permanently. Marland Kitchen Hyperkalemia  - From acute renal failure, lisinopril and Aldactone therapy.  -resolved, discussed above ACE inhibitor and spironolactone discontinued.  . Diabetes with Hypoglycemia  -This is due to acute renal failure and sulfonylurea use.  -The patient had a hemoglobin A1c done and it was 5.5 in the hospital , Amaryl was DC'd  -His blood sugars of oral hypoglycemics have mostly been in the low 100s, and his by mouth intake has not been that great.therefore the Amaryl has been discontinued upon discharge, his to continue modified carbohydrate diet.nursing home M.D./outpatient MDs to continue to monitor her his blood sugars and further treat accordingly  . Altered mental status  - Secondary to toxic metabolite encephalopathy from hypoglycemia and acute renal failure.  -improved, back to baseline  . Normocytic Anemia  - I suspect that this is a combination secondary to acute blood loss from recent hip fracture, chronic renal failure, CML and prostate Ca. There is no overt evidence of  blood loss.  -will continue iron repletion as recommended by renal  -will continue to follow Hgb trend, Hgb today 7.6 today- still no gross bleeding, pt is on ASA and plavix which will affect stool guaics if done at this time  -continue niferex (for better absorption), follow  . HTN (hypertension)  -continue metoprolol, lasix and monitor  -avoiding ACE and aldactone secondary to hyperkalemia this hosp stay   . Prostate cancer and urinary retention  - History of prostate cancer status post radioactive seeding, follows with Dr. [name] at the cancer [facility]  - Will continue with Flomax and Ditropan   . Chronic diastolic heart failure  -History of chronic diastolic heart failure, currently clinically compensated.  -Remain compensated at this hospital stay, his lasix was resumed as above and his to continue this upon discharge.  . Recent hip fracture and pain all over  - Will need SNF per PT rec's  -will use PRN tramadol  -LE's dopplers negative for DVT.  Marland Kitchen History of CML  - Is on chronic nilotinib therapy, follows up at the [facility]  -continue nilotinib upon discharge . Intravenous access Issues  -IJ discontinue on 6/20  -femoral line discontinue on 6/22  -2 peripheral IV's in place  Moderate protein calorie malnutrition and hypoalbuminemia:  -continue Nepro  BID  -encourage PO intake      Procedures:  Stress test No findings for ischemia, large inferolateral scar/infarct, calculated EF 61%  Doppler ultrasound of lower extremities -Negative for DVT   2-D echocardiogram  Study Conclusions  - Left ventricle: The cavity size was normal. Wall thickness was increased in a pattern of mild LVH. Systolic function was normal. The estimated ejection fraction was in the range of 55% to 60%. Wall motion was normal; there were no regional wall motion abnormalities. Doppler parameters are consistent with abnormal left ventricular relaxation (grade 1 diastolic  dysfunction). - Atrial septum: There was redundancy of the septum, with borderline criteria for aneurysm.     Consultations:  Cardiology-Dr. Kelly/Dr. Rennis Golden  Nephrology-Dr. Eliott Nine  Discharge Exam: Filed Vitals:   07/09/12 1750 07/09/12 2044 07/10/12 0514 07/10/12 1000  BP: 146/56 166/76 158/71 158/51  Pulse: 72 66 66 67  Temp: 98 F (36.7 C) 99.1 F (37.3 C) 98.2 F (36.8 C) 98.6 F (37 C)  TempSrc: Oral Oral Oral Oral  Resp: 18 18 18 18   Height:      Weight:      SpO2: 98% 100% 100% 100%     Discharge Instructions  Discharge Orders   Future Orders Complete By Expires     Diet Carb Modified  As directed     Increase activity slowly  As directed         Medication List    STOP taking these medications       amLODipine 5 MG tablet  Commonly known as:  NORVASC     ferrous sulfate 325 (65 FE) MG tablet     glimepiride 4 MG tablet  Commonly known as:  AMARYL     lisinopril 5 MG tablet  Commonly known as:  PRINIVIL,ZESTRIL      TAKE these medications       acetaminophen 325 MG tablet  Commonly known as:  TYLENOL  Take 2 tablets (650 mg total) by mouth every 6 (six) hours as needed.     allopurinol 300 MG tablet  Commonly known as:  ZYLOPRIM  Take 0.5 tablets (150 mg total) by mouth daily.     aspirin 81 MG chewable tablet  Chew 81 mg by mouth every other day.     clopidogrel 75 MG tablet  Commonly known as:  PLAVIX  Take 75 mg by mouth daily.     famotidine 40 MG tablet  Commonly known as:  PEPCID  Take 1 tablet (40 mg total) by mouth daily.     feeding supplement Pudg  Take 1 Container by mouth 3 (three) times daily between meals.     feeding supplement Liqd  Take 237 mLs by mouth daily at 12 noon.  Start taking on:  07/11/2012     finasteride 5 MG tablet  Commonly known as:  PROSCAR  Take 5 mg by mouth daily.     furosemide 20 MG tablet  Commonly known as:  LASIX  Take 40-60 mg by mouth 2 (two) times daily. Take 60 mg in the morning  and 40 mg in the evening     hydrALAZINE 10 MG tablet  Commonly known as:  APRESOLINE  Take 10 mg by mouth 3 (three) times daily.     ipratropium-albuterol 0.5-2.5 (3) MG/3ML Soln  Commonly known as:  DUONEB  Take 3 mLs by nebulization every 6 (six) hours as needed (wheezing).     iron polysaccharides 150 MG capsule  Commonly known as:  NIFEREX  Take 1 capsule (150 mg total) by mouth daily.     isosorbide mononitrate 30 MG 24 hr tablet  Commonly known as:  IMDUR  Take 30 mg by mouth every morning.     lubiprostone 24 MCG capsule  Commonly known as:  AMITIZA  Take 24 mcg by mouth 2 (two) times daily with a meal.     magnesium oxide 400 (241.3 MG) MG tablet  Commonly known as:  MAG-OX  Take 1 tablet (400 mg total) by mouth daily.     meclizine 25 MG tablet  Commonly known as:  ANTIVERT  Take 25 mg by mouth 3 (three) times daily as needed for dizziness.     metoprolol tartrate 12.5 mg Tabs  Commonly known as:  LOPRESSOR  Take 1.5 tablets (37.5 mg total) by mouth 2 (two) times daily.     nilotinib 150 MG capsule  Commonly known as:  TASIGNA  Take 300 mg by mouth every 12 (twelve) hours.     omega-3 acid ethyl esters 1 G capsule  Commonly known as:  LOVAZA  Take 1 g by mouth 2 (two) times daily.     oxybutynin 5 MG 24 hr tablet  Commonly known as:  DITROPAN-XL  Take 5 mg by mouth at bedtime.     simvastatin 40 MG tablet  Commonly known as:  ZOCOR  Take 40 mg by mouth at bedtime.     spironolactone 25 MG tablet  Commonly known as:  ALDACTONE  Take 25 mg by mouth daily.     sucralfate 1 GM/10ML suspension  Commonly known as:  CARAFATE  Take 10 mLs (1 g total) by mouth 4 (four) times daily -  with meals and at bedtime.     tamsulosin 0.4 MG Caps  Commonly known as:  FLOMAX  Take 0.4 mg by mouth 2 (two) times daily.     traMADol 50 MG tablet  Commonly known as:  ULTRAM  Take 1 tablet (50 mg total) by mouth every 6 (six) hours as needed.     vitamin E 400 UNIT  capsule  Take 400 Units by mouth 2 (two) times daily.       No Known Allergies     Follow-up Information   Please follow up. (SNF in one to 2 days)       Please follow up. (His cardiologist in [place]  in one to 2 weeks, call for appointment upon discharge)        The results of significant diagnostics from this hospitalization (including imaging, microbiology, ancillary and laboratory) are listed below for reference.    Significant Diagnostic Studies: Dg Chest 2 View  07/04/2012   *RADIOLOGY REPORT*  Clinical Data: Shortness of breath.  CHEST - 2 VIEW  Comparison: 06/30/2012  Findings: Elevation of the right hemidiaphragm, stable.  Scarring or atelectasis in the right base.  Postoperative changes on the right.  Mild cardiomegaly and vascular congestion.  No confluent opacity on the left.  No visible effusions.  Increasing interstitial prominence of the lungs could reflect interstitial edema.  IMPRESSION: Postoperative changes on the right with chronic elevation of the right hemidiaphragm.  Question mild interstitial edema.   Original Report Authenticated By: Charlett Nose, M.D.   US Renal  06/30/2012   *RADIOLOGY REPORT*  Clinical Data:  Acute renal failure  RENAL/URINARY TRACT ULTRASOUND COMPLETE  Comparison:  11/04/2010.  Findings:  Right Kidney:  Measures 9.1 cm, previously 11 cm.  Normal in size and parenchymal echogenicity.  No evidence of mass or hydronephrosis.  Left Kidney:  May use 10.4 cm.  Previously 11 cm.  Normal in size and parenchymal echogenicity.  No evidence of mass or hydronephrosis.  Bladder:  Collapsed around Foley catheter balloon.  IMPRESSION: 1.  No explanation for patient's acute renal failure.  No evidence for hydronephrosis.   Original Report Authenticated By: Signa Kell, M.D.   Nm Myocar Multi W/spect W/wall Motion / Ef  07/07/2012   *RADIOLOGY REPORT*  Clinical Data:  Chest pain.  MYOCARDIAL IMAGING WITH SPECT (REST AND PHARMACOLOGIC-STRESS) GATED LEFT  VENTRICULAR WALL MOTION STUDY LEFT VENTRICULAR EJECTION FRACTION  Technique:  Standard myocardial SPECT imaging was performed after resting intravenous injection of 10 mCi Tc-38m sestamibi. Subsequently, intravenous infusion of Lexiscan was performed under the supervision of the Cardiology staff.  At peak effect of the drug, 30 mCi Tc-45m sestamibi was injected intravenously and standard myocardial SPECT  imaging was performed.  Quantitative gated imaging was also performed to evaluate left ventricular wall motion, and estimate left ventricular ejection fraction.  Comparison:  None.  Findings: The left ventricle is dilated.  There is a large area of scar involving the inferior lateral wall with associated wall motion abnormality.  No reversible defects to suggest ischemia. The ejection fraction was calculated at 61%.  IMPRESSION:  1.  Large inferolateral scar/infarct 2.  No findings for ischemia. 3.  Calculated ejection fraction was 61%.   Original Report Authenticated By: Rudie Meyer, M.D.   Dg Chest Port 1 View  07/02/2012   *RADIOLOGY REPORT*  Clinical Data: Short of breath.  Central line placement  PORTABLE CHEST - 1 VIEW  Comparison: 06/30/2012  Findings: Left jugular catheter extends into the left subclavian vein with the tip near the left axillary vein, unchanged from earlier today.  No pneumothorax  Elevated right hemidiaphragm and right lower lobe atelectasis also unchanged.  Postsurgical changes on the right with apical pleural scarring.  Negative for heart failure.  IMPRESSION: Left jugular catheter tip in the left axillary vein unchanged.  Postsurgical changes on the right with elevated right hemidiaphragm.   Original Report Authenticated By: Janeece Riggers, M.D.    Microbiology: Recent Results (from the past 240 hour(s))  URINE CULTURE     Status: None   Collection Time    06/30/12  5:35 PM      Result Value Range Status   Specimen Description URINE, CATHETERIZED   Final   Special Requests  NONE   Final   Culture  Setup Time 06/30/2012 18:38   Final   Colony Count NO GROWTH   Final   Culture NO GROWTH   Final   Report Status 07/01/2012 FINAL   Final  MRSA PCR SCREENING     Status: None   Collection Time    06/30/12  6:37 PM      Result Value Range Status   MRSA by PCR NEGATIVE  NEGATIVE Final   Comment:            The GeneXpert MRSA Assay (FDA     approved for NASAL specimens     only), is one component of a     comprehensive MRSA colonization     surveillance program. It is not     intended to diagnose MRSA     infection nor to guide or     monitor treatment for     MRSA infections.     Labs: Basic Metabolic Panel:  Recent Labs Lab 07/04/12 0606 07/05/12 0235  07/06/12 0610 07/07/12 0615 07/09/12 0505  NA 138 135 140 139 137  K 5.8* 6.4* 4.4 4.4 4.0  CL 104 103 107 106 104  CO2 25 24 26 27 28   GLUCOSE 79 101* 97 91 109*  BUN 46* 36* 30* 30* 25*  CREATININE 2.07* 1.73* 1.35 1.26 1.01  CALCIUM 8.8 8.9 8.6 8.5 8.3*  MG 1.3*  --   --   --   --    Liver Function Tests: No results found for this basename: AST, ALT, ALKPHOS, BILITOT, PROT, ALBUMIN,  in the last 168 hours No results found for this basename: LIPASE, AMYLASE,  in the last 168 hours No results found for this basename: AMMONIA,  in the last 168 hours CBC:  Recent Labs Lab 07/06/12 0610 07/07/12 0615 07/08/12 0540 07/09/12 0505 07/10/12 0645  WBC 11.4* 10.7* 11.8* 9.9 9.0  HGB 7.8* 7.5* 7.8* 7.6* 7.6*  HCT 24.2* 23.0* 23.8* 23.0* 23.2*  MCV 91.0 90.9 91.2 89.8 90.6  PLT 312 302 327 289 291   Cardiac Enzymes:  Recent Labs Lab 07/04/12 1538 07/04/12 2148 07/05/12 0234 07/06/12 1150  TROPONINI <0.30 0.47* 0.59* <0.30   BNP: BNP (last 3 results) No results found for this basename: PROBNP,  in the last 8760 hours CBG:  Recent Labs Lab 07/09/12 1129 07/09/12 1622 07/09/12 2120 07/10/12 0741 07/10/12 1127  GLUCAP 174* 147* 146* 126* 111*        Signed:  Javid Kemler C  [facility] 07/10/2012, 3:45 PM

## 2012-07-10 NOTE — Progress Notes (Signed)
NUTRITION FOLLOW UP  DOCUMENTATION CODES  Per approved criteria   -Severe malnutrition in the context of chronic illness    Intervention:   1. Decrease Glucerna Shake to daily 2. Discontinue Nepro Shake and [employer] 3. Add Ensure Pudding TID 4. RD to continue to follow nutrition care plan.  Nutrition Dx:   Inadequate oral intake related to ARF as evidenced by dietary recall and ongoing weight loss. Ongoing.  Goal:   Intake to meet >90% of estimated nutrition needs. Unmet.  Monitor:   weight trends, lab trends, I/O's, PO intake, supplement tolerance  Assessment:   PMHx of CML, stage II-III CKD, hx of prostate CA.   Pt reports that his appetite is better and he is beginning to feel stronger. PO intake of Dysphagia 3 meals is variable - ranging from 0 - 100%. More recently, intake is 100%.  Currently ordered for Glucerna Shake PO TID, Nepro daily and [employer] daily. Pt is not sure which supplements he likes best, is asking for pudding - RD to order for him. Will consolidate supplements.  Pt reports that he sometimes sleeps through his meals - confirmed with RN. He notes that the grilled ham and cheese sandwich was too difficult for him to chew, he would like a non-grilled sandwich for lunch - RD to arrange.   Pt meets criteria for moderate MALNUTRITION in the context of chronic illness as evidenced by 19% wt loss x 1 year, intake of <75% x at least 1 month and mild-moderate muscle mass loss.   Height: Ht Readings from Last 1 Encounters:  07/03/12 5\' 8"  (1.727 m)    Weight Status:   Wt Readings from Last 1 Encounters:  07/07/12 166 lb 4.8 oz (75.433 kg)  Wt is up 18 lb x 1 week.  Re-estimated needs:  Kcal: 1800 - 2000 Protein: 66 - 83 grams protein Fluid: 1.8 - 2 liters  Skin: intact  Diet Order: Dysphagia 3; thin liquids   Intake/Output Summary (Last 24 hours) at 07/10/12 1047 Last data filed at 07/10/12 0900  Gross per 24 hour  Intake    840 ml   Output   1450 ml  Net   -610 ml    Last BM: 6/29   Labs:   Recent Labs Lab 07/04/12 0606  07/06/12 0610 07/07/12 0615 07/09/12 0505  NA 138  < > 140 139 137  K 5.8*  < > 4.4 4.4 4.0  CL 104  < > 107 106 104  CO2 25  < > 26 27 28   BUN 46*  < > 30* 30* 25*  CREATININE 2.07*  < > 1.35 1.26 1.01  CALCIUM 8.8  < > 8.6 8.5 8.3*  MG 1.3*  --   --   --   --   GLUCOSE 79  < > 97 91 109*  < > = values in this interval not displayed.  CBG (last 3)   Recent Labs  07/09/12 1622 07/09/12 2120 07/10/12 0741  GLUCAP 147* 146* 126*    Scheduled Meds: . aspirin  81 mg Oral Q48H  . clopidogrel  75 mg Oral Daily  . famotidine  40 mg Oral Daily  . feeding supplement  237 mL Oral TID BM  . feeding supplement (NEPRO CARB STEADY)  237 mL Oral Q24H  . feeding supplement  1 Container Oral Q24H  . finasteride  5 mg Oral Daily  . furosemide  20 mg Oral Daily  . insulin aspart  0-9 Units  Subcutaneous TID WC  . iron polysaccharides  150 mg Oral Daily  . lubiprostone  24 mcg Oral BID WC  . magnesium oxide  400 mg Oral Daily  . metoprolol  37.5 mg Oral BID  . nilotinib  300 mg Oral Q12H  . oxybutynin  5 mg Oral QHS  . simvastatin  40 mg Oral QHS  . sodium chloride  3 mL Intravenous Q12H  . sucralfate  1 g Oral TID WC & HS  . tamsulosin  0.4 mg Oral BID  . vitamin E  400 Units Oral BID    Continuous Infusions: . sodium chloride 10 mL/hr at 07/04/12 1623    Jarold Motto MS, RD, LDN Pager: [phone] After-hours pager: [phone]

## 2012-07-10 NOTE — Progress Notes (Addendum)
Report called to Luanne at [facility]. Pt's IV removed. Tele removed. Pt ready for transport. Social work notified. Medications pulled from pharmacy, currently at bedside, will need to be transported back to [facility] by EMS.

## 2013-12-30 ENCOUNTER — Inpatient Hospital Stay (HOSPITAL_COMMUNITY)
Admission: EM | Admit: 2013-12-30 | Discharge: 2014-02-11 | DRG: 004 | Disposition: E | Payer: Medicare HMO | Attending: Pulmonary Disease | Admitting: Pulmonary Disease

## 2013-12-30 ENCOUNTER — Encounter (HOSPITAL_COMMUNITY): Payer: Self-pay

## 2013-12-30 ENCOUNTER — Emergency Department (HOSPITAL_COMMUNITY): Payer: Medicare HMO

## 2013-12-30 DIAGNOSIS — E1122 Type 2 diabetes mellitus with diabetic chronic kidney disease: Secondary | ICD-10-CM | POA: Diagnosis present

## 2013-12-30 DIAGNOSIS — I10 Essential (primary) hypertension: Secondary | ICD-10-CM

## 2013-12-30 DIAGNOSIS — Z87891 Personal history of nicotine dependence: Secondary | ICD-10-CM | POA: Diagnosis not present

## 2013-12-30 DIAGNOSIS — C9210 Chronic myeloid leukemia, BCR/ABL-positive, not having achieved remission: Secondary | ICD-10-CM | POA: Diagnosis present

## 2013-12-30 DIAGNOSIS — A419 Sepsis, unspecified organism: Secondary | ICD-10-CM | POA: Diagnosis not present

## 2013-12-30 DIAGNOSIS — G9341 Metabolic encephalopathy: Secondary | ICD-10-CM | POA: Diagnosis present

## 2013-12-30 DIAGNOSIS — R1310 Dysphagia, unspecified: Secondary | ICD-10-CM | POA: Diagnosis not present

## 2013-12-30 DIAGNOSIS — Z79891 Long term (current) use of opiate analgesic: Secondary | ICD-10-CM

## 2013-12-30 DIAGNOSIS — Z978 Presence of other specified devices: Secondary | ICD-10-CM

## 2013-12-30 DIAGNOSIS — E872 Acidosis: Secondary | ICD-10-CM | POA: Diagnosis present

## 2013-12-30 DIAGNOSIS — I739 Peripheral vascular disease, unspecified: Secondary | ICD-10-CM | POA: Diagnosis present

## 2013-12-30 DIAGNOSIS — E46 Unspecified protein-calorie malnutrition: Secondary | ICD-10-CM | POA: Diagnosis not present

## 2013-12-30 DIAGNOSIS — E870 Hyperosmolality and hypernatremia: Secondary | ICD-10-CM | POA: Diagnosis not present

## 2013-12-30 DIAGNOSIS — I252 Old myocardial infarction: Secondary | ICD-10-CM

## 2013-12-30 DIAGNOSIS — J9600 Acute respiratory failure, unspecified whether with hypoxia or hypercapnia: Secondary | ICD-10-CM

## 2013-12-30 DIAGNOSIS — J9819 Other pulmonary collapse: Secondary | ICD-10-CM

## 2013-12-30 DIAGNOSIS — M1990 Unspecified osteoarthritis, unspecified site: Secondary | ICD-10-CM | POA: Diagnosis present

## 2013-12-30 DIAGNOSIS — R519 Headache, unspecified: Secondary | ICD-10-CM | POA: Insufficient documentation

## 2013-12-30 DIAGNOSIS — Z4659 Encounter for fitting and adjustment of other gastrointestinal appliance and device: Secondary | ICD-10-CM

## 2013-12-30 DIAGNOSIS — J9601 Acute respiratory failure with hypoxia: Secondary | ICD-10-CM | POA: Diagnosis present

## 2013-12-30 DIAGNOSIS — G8929 Other chronic pain: Secondary | ICD-10-CM | POA: Diagnosis not present

## 2013-12-30 DIAGNOSIS — J15211 Pneumonia due to Methicillin susceptible Staphylococcus aureus: Secondary | ICD-10-CM | POA: Diagnosis present

## 2013-12-30 DIAGNOSIS — E875 Hyperkalemia: Secondary | ICD-10-CM | POA: Diagnosis present

## 2013-12-30 DIAGNOSIS — D638 Anemia in other chronic diseases classified elsewhere: Secondary | ICD-10-CM | POA: Diagnosis present

## 2013-12-30 DIAGNOSIS — M1000 Idiopathic gout, unspecified site: Secondary | ICD-10-CM | POA: Diagnosis present

## 2013-12-30 DIAGNOSIS — N189 Chronic kidney disease, unspecified: Secondary | ICD-10-CM | POA: Diagnosis present

## 2013-12-30 DIAGNOSIS — Z7982 Long term (current) use of aspirin: Secondary | ICD-10-CM

## 2013-12-30 DIAGNOSIS — K219 Gastro-esophageal reflux disease without esophagitis: Secondary | ICD-10-CM | POA: Diagnosis present

## 2013-12-30 DIAGNOSIS — E669 Obesity, unspecified: Secondary | ICD-10-CM | POA: Diagnosis present

## 2013-12-30 DIAGNOSIS — E785 Hyperlipidemia, unspecified: Secondary | ICD-10-CM | POA: Diagnosis present

## 2013-12-30 DIAGNOSIS — N179 Acute kidney failure, unspecified: Secondary | ICD-10-CM | POA: Diagnosis present

## 2013-12-30 DIAGNOSIS — J90 Pleural effusion, not elsewhere classified: Secondary | ICD-10-CM

## 2013-12-30 DIAGNOSIS — Y95 Nosocomial condition: Secondary | ICD-10-CM | POA: Diagnosis present

## 2013-12-30 DIAGNOSIS — D6959 Other secondary thrombocytopenia: Secondary | ICD-10-CM | POA: Diagnosis present

## 2013-12-30 DIAGNOSIS — I480 Paroxysmal atrial fibrillation: Secondary | ICD-10-CM | POA: Diagnosis present

## 2013-12-30 DIAGNOSIS — G629 Polyneuropathy, unspecified: Secondary | ICD-10-CM | POA: Diagnosis present

## 2013-12-30 DIAGNOSIS — I130 Hypertensive heart and chronic kidney disease with heart failure and stage 1 through stage 4 chronic kidney disease, or unspecified chronic kidney disease: Secondary | ICD-10-CM | POA: Diagnosis present

## 2013-12-30 DIAGNOSIS — J9809 Other diseases of bronchus, not elsewhere classified: Secondary | ICD-10-CM | POA: Diagnosis present

## 2013-12-30 DIAGNOSIS — J189 Pneumonia, unspecified organism: Secondary | ICD-10-CM

## 2013-12-30 DIAGNOSIS — J9690 Respiratory failure, unspecified, unspecified whether with hypoxia or hypercapnia: Secondary | ICD-10-CM

## 2013-12-30 DIAGNOSIS — Z515 Encounter for palliative care: Secondary | ICD-10-CM | POA: Diagnosis not present

## 2013-12-30 DIAGNOSIS — Z7902 Long term (current) use of antithrombotics/antiplatelets: Secondary | ICD-10-CM | POA: Diagnosis not present

## 2013-12-30 DIAGNOSIS — R4182 Altered mental status, unspecified: Secondary | ICD-10-CM | POA: Diagnosis present

## 2013-12-30 DIAGNOSIS — Z431 Encounter for attention to gastrostomy: Secondary | ICD-10-CM

## 2013-12-30 DIAGNOSIS — Z452 Encounter for adjustment and management of vascular access device: Secondary | ICD-10-CM

## 2013-12-30 DIAGNOSIS — D509 Iron deficiency anemia, unspecified: Secondary | ICD-10-CM | POA: Diagnosis present

## 2013-12-30 DIAGNOSIS — N401 Enlarged prostate with lower urinary tract symptoms: Secondary | ICD-10-CM | POA: Diagnosis present

## 2013-12-30 DIAGNOSIS — N390 Urinary tract infection, site not specified: Secondary | ICD-10-CM | POA: Diagnosis present

## 2013-12-30 DIAGNOSIS — D696 Thrombocytopenia, unspecified: Secondary | ICD-10-CM | POA: Diagnosis not present

## 2013-12-30 DIAGNOSIS — J9622 Acute and chronic respiratory failure with hypercapnia: Secondary | ICD-10-CM | POA: Diagnosis present

## 2013-12-30 DIAGNOSIS — I5033 Acute on chronic diastolic (congestive) heart failure: Secondary | ICD-10-CM | POA: Diagnosis present

## 2013-12-30 DIAGNOSIS — Z902 Acquired absence of lung [part of]: Secondary | ICD-10-CM | POA: Diagnosis present

## 2013-12-30 DIAGNOSIS — R0602 Shortness of breath: Secondary | ICD-10-CM

## 2013-12-30 DIAGNOSIS — R404 Transient alteration of awareness: Secondary | ICD-10-CM

## 2013-12-30 DIAGNOSIS — E780 Pure hypercholesterolemia: Secondary | ICD-10-CM | POA: Diagnosis present

## 2013-12-30 DIAGNOSIS — Z6832 Body mass index (BMI) 32.0-32.9, adult: Secondary | ICD-10-CM

## 2013-12-30 DIAGNOSIS — I2510 Atherosclerotic heart disease of native coronary artery without angina pectoris: Secondary | ICD-10-CM | POA: Diagnosis present

## 2013-12-30 DIAGNOSIS — Z8546 Personal history of malignant neoplasm of prostate: Secondary | ICD-10-CM | POA: Diagnosis not present

## 2013-12-30 DIAGNOSIS — K117 Disturbances of salivary secretion: Secondary | ICD-10-CM | POA: Insufficient documentation

## 2013-12-30 DIAGNOSIS — E876 Hypokalemia: Secondary | ICD-10-CM | POA: Diagnosis not present

## 2013-12-30 DIAGNOSIS — T17500A Unspecified foreign body in bronchus causing asphyxiation, initial encounter: Secondary | ICD-10-CM | POA: Insufficient documentation

## 2013-12-30 DIAGNOSIS — J9811 Atelectasis: Secondary | ICD-10-CM

## 2013-12-30 DIAGNOSIS — J9602 Acute respiratory failure with hypercapnia: Secondary | ICD-10-CM

## 2013-12-30 DIAGNOSIS — I5032 Chronic diastolic (congestive) heart failure: Secondary | ICD-10-CM | POA: Diagnosis present

## 2013-12-30 DIAGNOSIS — Z66 Do not resuscitate: Secondary | ICD-10-CM | POA: Diagnosis not present

## 2013-12-30 DIAGNOSIS — J398 Other specified diseases of upper respiratory tract: Secondary | ICD-10-CM | POA: Insufficient documentation

## 2013-12-30 DIAGNOSIS — I959 Hypotension, unspecified: Secondary | ICD-10-CM | POA: Diagnosis present

## 2013-12-30 DIAGNOSIS — D649 Anemia, unspecified: Secondary | ICD-10-CM | POA: Diagnosis present

## 2013-12-30 DIAGNOSIS — N19 Unspecified kidney failure: Secondary | ICD-10-CM

## 2013-12-30 DIAGNOSIS — J449 Chronic obstructive pulmonary disease, unspecified: Secondary | ICD-10-CM | POA: Diagnosis present

## 2013-12-30 DIAGNOSIS — E119 Type 2 diabetes mellitus without complications: Secondary | ICD-10-CM

## 2013-12-30 DIAGNOSIS — T17990A Other foreign object in respiratory tract, part unspecified in causing asphyxiation, initial encounter: Secondary | ICD-10-CM | POA: Diagnosis not present

## 2013-12-30 DIAGNOSIS — R51 Headache: Secondary | ICD-10-CM

## 2013-12-30 LAB — BLOOD GAS, ARTERIAL
Acid-base deficit: 2.6 mmol/L — ABNORMAL HIGH (ref 0.0–2.0)
Bicarbonate: 23.6 mEq/L (ref 20.0–24.0)
DRAWN BY: 246861
O2 CONTENT: 4 L/min
O2 Saturation: 85 %
PCO2 ART: 56.3 mmHg — AB (ref 35.0–45.0)
PH ART: 7.247 — AB (ref 7.350–7.450)
Patient temperature: 98.6
TCO2: 25.4 mmol/L (ref 0–100)
pO2, Arterial: 53.1 mmHg — ABNORMAL LOW (ref 80.0–100.0)

## 2013-12-30 LAB — URINALYSIS, ROUTINE W REFLEX MICROSCOPIC
GLUCOSE, UA: NEGATIVE mg/dL
Ketones, ur: NEGATIVE mg/dL
Nitrite: NEGATIVE
Protein, ur: 30 mg/dL — AB
SPECIFIC GRAVITY, URINE: 1.02 (ref 1.005–1.030)
Urobilinogen, UA: 0.2 mg/dL (ref 0.0–1.0)
pH: 5 (ref 5.0–8.0)

## 2013-12-30 LAB — URINE MICROSCOPIC-ADD ON

## 2013-12-30 LAB — GLUCOSE, CAPILLARY
GLUCOSE-CAPILLARY: 156 mg/dL — AB (ref 70–99)
GLUCOSE-CAPILLARY: 225 mg/dL — AB (ref 70–99)

## 2013-12-30 LAB — COMPREHENSIVE METABOLIC PANEL
ALBUMIN: 3 g/dL — AB (ref 3.5–5.2)
ALK PHOS: 106 U/L (ref 39–117)
ALT: 27 U/L (ref 0–53)
AST: 20 U/L (ref 0–37)
Anion gap: 12 (ref 5–15)
BILIRUBIN TOTAL: 0.5 mg/dL (ref 0.3–1.2)
BUN: 52 mg/dL — ABNORMAL HIGH (ref 6–23)
CHLORIDE: 102 meq/L (ref 96–112)
CO2: 24 mEq/L (ref 19–32)
Calcium: 8.2 mg/dL — ABNORMAL LOW (ref 8.4–10.5)
Creatinine, Ser: 3.33 mg/dL — ABNORMAL HIGH (ref 0.50–1.35)
GFR calc Af Amer: 20 mL/min — ABNORMAL LOW (ref >90)
GFR calc non Af Amer: 17 mL/min — ABNORMAL LOW (ref >90)
GLUCOSE: 118 mg/dL — AB (ref 70–99)
POTASSIUM: 6.1 meq/L — AB (ref 3.7–5.3)
SODIUM: 138 meq/L (ref 137–147)
TOTAL PROTEIN: 6 g/dL (ref 6.0–8.3)

## 2013-12-30 LAB — CBC
HCT: 27.6 % — ABNORMAL LOW (ref 39.0–52.0)
HEMOGLOBIN: 8.6 g/dL — AB (ref 13.0–17.0)
MCH: 28.7 pg (ref 26.0–34.0)
MCHC: 31.2 g/dL (ref 30.0–36.0)
MCV: 92 fL (ref 78.0–100.0)
Platelets: 147 10*3/uL — ABNORMAL LOW (ref 150–400)
RBC: 3 MIL/uL — ABNORMAL LOW (ref 4.22–5.81)
RDW: 17.2 % — AB (ref 11.5–15.5)
WBC: 7.3 10*3/uL (ref 4.0–10.5)

## 2013-12-30 LAB — I-STAT CG4 LACTIC ACID, ED: LACTIC ACID, VENOUS: 0.56 mmol/L (ref 0.5–2.2)

## 2013-12-30 LAB — BASIC METABOLIC PANEL
Anion gap: 13 (ref 5–15)
BUN: 53 mg/dL — ABNORMAL HIGH (ref 6–23)
CALCIUM: 8.4 mg/dL (ref 8.4–10.5)
CO2: 24 meq/L (ref 19–32)
Chloride: 101 mEq/L (ref 96–112)
Creatinine, Ser: 3.16 mg/dL — ABNORMAL HIGH (ref 0.50–1.35)
GFR calc Af Amer: 21 mL/min — ABNORMAL LOW (ref >90)
GFR calc non Af Amer: 18 mL/min — ABNORMAL LOW (ref >90)
GLUCOSE: 167 mg/dL — AB (ref 70–99)
Potassium: 5.7 mEq/L — ABNORMAL HIGH (ref 3.7–5.3)
Sodium: 138 mEq/L (ref 137–147)

## 2013-12-30 LAB — PROCALCITONIN: PROCALCITONIN: 0.15 ng/mL

## 2013-12-30 LAB — TROPONIN I
Troponin I: <0.3 ng/mL (ref <0.30)
Troponin I: <0.3 ng/mL (ref <0.30)

## 2013-12-30 LAB — MAGNESIUM: MAGNESIUM: 2.3 mg/dL (ref 1.5–2.5)

## 2013-12-30 LAB — MRSA PCR SCREENING: MRSA by PCR: NEGATIVE

## 2013-12-30 LAB — CBG MONITORING, ED: Glucose-Capillary: 126 mg/dL — ABNORMAL HIGH (ref 70–99)

## 2013-12-30 LAB — PRO B NATRIURETIC PEPTIDE: PRO B NATRI PEPTIDE: 14048 pg/mL — AB (ref 0–125)

## 2013-12-30 LAB — PHOSPHORUS: PHOSPHORUS: 4.5 mg/dL (ref 2.3–4.6)

## 2013-12-30 MED ORDER — IPRATROPIUM-ALBUTEROL 0.5-2.5 (3) MG/3ML IN SOLN
3.0000 mL | Freq: Four times a day (QID) | RESPIRATORY_TRACT | Status: DC | PRN
  Administered 2014-01-01: 3 mL via RESPIRATORY_TRACT

## 2013-12-30 MED ORDER — VANCOMYCIN HCL IN DEXTROSE 1-5 GM/200ML-% IV SOLN
1000.0000 mg | INTRAVENOUS | Status: DC
  Filled 2013-12-30: qty 200

## 2013-12-30 MED ORDER — VANCOMYCIN HCL IN DEXTROSE 1-5 GM/200ML-% IV SOLN
1000.0000 mg | Freq: Once | INTRAVENOUS | Status: AC
  Administered 2013-12-30: 1000 mg via INTRAVENOUS
  Filled 2013-12-30: qty 200

## 2013-12-30 MED ORDER — INSULIN ASPART 100 UNIT/ML ~~LOC~~ SOLN
0.0000 [IU] | Freq: Every day | SUBCUTANEOUS | Status: DC
  Administered 2013-12-30: 2 [IU] via SUBCUTANEOUS

## 2013-12-30 MED ORDER — SODIUM CHLORIDE 0.9 % IJ SOLN: 3.0000 mL | Freq: Two times a day (BID) | INTRAMUSCULAR | Status: DC

## 2013-12-30 MED ORDER — SODIUM CHLORIDE 0.9 % IV SOLN
250.0000 mL | INTRAVENOUS | Status: DC | PRN
  Administered 2014-01-05 – 2014-01-09 (×2): 250 mL via INTRAVENOUS
  Administered 2014-01-10: 1000 mL via INTRAVENOUS
  Administered 2014-01-10 – 2014-01-20 (×3): 250 mL via INTRAVENOUS

## 2013-12-30 MED ORDER — FUROSEMIDE 10 MG/ML IJ SOLN
80.0000 mg | Freq: Once | INTRAMUSCULAR | Status: AC
  Administered 2013-12-30: 80 mg via INTRAVENOUS
  Filled 2013-12-30: qty 8

## 2013-12-30 MED ORDER — SODIUM POLYSTYRENE SULFONATE 15 GM/60ML PO SUSP
45.0000 g | Freq: Once | ORAL | Status: AC
  Administered 2013-12-30: 45 g via ORAL
  Filled 2013-12-30: qty 180

## 2013-12-30 MED ORDER — PREGABALIN 100 MG PO CAPS: 100.0000 mg | ORAL_CAPSULE | Freq: Two times a day (BID) | ORAL | Status: DC

## 2013-12-30 MED ORDER — SODIUM POLYSTYRENE SULFONATE 15 GM/60ML PO SUSP
15.0000 g | Freq: Once | ORAL | Status: AC
  Administered 2013-12-30: 15 g via ORAL
  Filled 2013-12-30: qty 60

## 2013-12-30 MED ORDER — FINASTERIDE 5 MG PO TABS
5.0000 mg | ORAL_TABLET | Freq: Every day | ORAL | Status: DC
  Administered 2013-12-31: 5 mg via ORAL
  Filled 2013-12-30 (×2): qty 1

## 2013-12-30 MED ORDER — CETYLPYRIDINIUM CHLORIDE 0.05 % MT LIQD
7.0000 mL | Freq: Two times a day (BID) | OROMUCOSAL | Status: DC
  Administered 2013-12-30 – 2014-01-01 (×3): 7 mL via OROMUCOSAL

## 2013-12-30 MED ORDER — ALLOPURINOL 300 MG PO TABS: 300.0000 mg | ORAL_TABLET | Freq: Every day | ORAL | Status: DC

## 2013-12-30 MED ORDER — FUROSEMIDE 10 MG/ML IJ SOLN: 80.0000 mg | Freq: Three times a day (TID) | INTRAMUSCULAR | Status: DC

## 2013-12-30 MED ORDER — SODIUM CHLORIDE 0.9 % IV BOLUS (SEPSIS)
500.0000 mL | Freq: Once | INTRAVENOUS | Status: AC
  Administered 2013-12-30: 500 mL via INTRAVENOUS

## 2013-12-30 MED ORDER — PANTOPRAZOLE SODIUM 40 MG IV SOLR
40.0000 mg | INTRAVENOUS | Status: DC
  Administered 2013-12-30 – 2013-12-31 (×2): 40 mg via INTRAVENOUS
  Filled 2013-12-30 (×4): qty 40

## 2013-12-30 MED ORDER — SENNA-DOCUSATE SODIUM 8.6-50 MG PO TABS: 2.0000 | ORAL_TABLET | Freq: Two times a day (BID) | ORAL | Status: DC

## 2013-12-30 MED ORDER — HEPARIN SODIUM (PORCINE) 5000 UNIT/ML IJ SOLN
5000.0000 [IU] | Freq: Three times a day (TID) | INTRAMUSCULAR | Status: DC
  Administered 2013-12-30 – 2014-01-18 (×55): 5000 [IU] via SUBCUTANEOUS
  Filled 2013-12-30 (×61): qty 1

## 2013-12-30 MED ORDER — SODIUM CHLORIDE 0.9 % IJ SOLN: 3.0000 mL | INTRAMUSCULAR | Status: DC | PRN

## 2013-12-30 MED ORDER — ASPIRIN 81 MG PO CHEW
81.0000 mg | CHEWABLE_TABLET | Freq: Every day | ORAL | Status: DC
  Administered 2013-12-31: 81 mg via ORAL
  Filled 2013-12-30: qty 1

## 2013-12-30 MED ORDER — DEXTROSE 5 % IV SOLN
1.0000 g | Freq: Once | INTRAVENOUS | Status: AC
  Administered 2013-12-30: 1 g via INTRAVENOUS
  Filled 2013-12-30: qty 1

## 2013-12-30 MED ORDER — DEXTROSE 5 % IV SOLN
1.0000 g | INTRAVENOUS | Status: DC
  Administered 2013-12-31: 1 g via INTRAVENOUS
  Filled 2013-12-30: qty 1

## 2013-12-30 MED ORDER — INSULIN ASPART 100 UNIT/ML ~~LOC~~ SOLN: 0.0000 [IU] | Freq: Three times a day (TID) | SUBCUTANEOUS | Status: DC

## 2013-12-30 MED ORDER — SODIUM CHLORIDE 0.9 % IV SOLN
1.0000 g | Freq: Once | INTRAVENOUS | Status: AC
  Administered 2013-12-30: 1 g via INTRAVENOUS
  Filled 2013-12-30: qty 10

## 2013-12-30 MED ORDER — RANOLAZINE ER 500 MG PO TB12
500.0000 mg | ORAL_TABLET | Freq: Two times a day (BID) | ORAL | Status: DC
  Administered 2013-12-31: 500 mg via ORAL
  Filled 2013-12-30 (×3): qty 1

## 2013-12-30 MED ORDER — PANTOPRAZOLE SODIUM 40 MG PO TBEC: 40.0000 mg | DELAYED_RELEASE_TABLET | Freq: Every day | ORAL | Status: DC

## 2013-12-30 MED ORDER — ACETAMINOPHEN 325 MG PO TABS
650.0000 mg | ORAL_TABLET | Freq: Four times a day (QID) | ORAL | Status: DC | PRN
  Administered 2013-12-31 – 2014-01-01 (×2): 650 mg via ORAL
  Filled 2013-12-30 (×3): qty 2

## 2013-12-30 MED ORDER — CLOPIDOGREL BISULFATE 75 MG PO TABS
75.0000 mg | ORAL_TABLET | Freq: Every day | ORAL | Status: DC
  Administered 2013-12-31: 75 mg via ORAL
  Filled 2013-12-30 (×2): qty 1

## 2013-12-30 MED ORDER — ALLOPURINOL 150 MG HALF TABLET: 150.0000 mg | ORAL_TABLET | Freq: Every day | ORAL | Status: DC

## 2013-12-30 MED ORDER — SENNOSIDES-DOCUSATE SODIUM 8.6-50 MG PO TABS
2.0000 | ORAL_TABLET | Freq: Two times a day (BID) | ORAL | Status: DC
  Administered 2014-01-01 – 2014-01-06 (×8): 2 via ORAL
  Filled 2013-12-30 (×15): qty 2

## 2013-12-30 MED ORDER — NILOTINIB HCL 150 MG PO CAPS: 300.0000 mg | ORAL_CAPSULE | Freq: Two times a day (BID) | ORAL | Status: DC

## 2013-12-30 MED ORDER — FUROSEMIDE 10 MG/ML IJ SOLN
40.0000 mg | Freq: Once | INTRAMUSCULAR | Status: AC
  Administered 2013-12-30: 40 mg via INTRAVENOUS
  Filled 2013-12-30: qty 4

## 2013-12-30 NOTE — ED Notes (Signed)
Pt placed on 6L pt SATS in 89 MD made aware.

## 2013-12-30 NOTE — ED Notes (Signed)
Attempted report 

## 2013-12-30 NOTE — ED Provider Notes (Signed)
CSN: [id number]     Arrival date & time 12/28/2013  1020 History   First MD Initiated Contact with Patient 01/08/2014 1020     Chief Complaint  Patient presents with  . Abnormal Lab     (Consider location/radiation/quality/duration/timing/severity/associated sxs/prior Treatment) Patient is a 73 y.o. male presenting with general illness. The history is provided by the patient.  Illness Location:  Nursing home Quality:  ARF, hyperkalemia Severity:  Moderate Onset quality:  Gradual Timing:  Constant Progression:  Unchanged Chronicity:  Recurrent Context:  Labs showed ARF and hyperkalemia at his nursing home Relieved by:  Nothing Worsened by:  Nothing Associated symptoms: abdominal pain   Associated symptoms: no chest pain, no cough, no fever, no shortness of breath and no vomiting     Past Medical History  Diagnosis Date  . Hypertension   . Hypercholesterolemia   . GERD (gastroesophageal reflux disease)   . Dyspnea on exertion   . Claudication in peripheral vascular disease   . PVD (peripheral vascular disease)   . S/P renal artery angioplasty 2009--- W/ STENT X1  TO LEFT COMMON ILIAC    . ED (erectile dysfunction)   . History of BPH   . Frequency of urination   . Urgency of urination   . Nocturia   . Chronic gouty arthritis FEET  . Diabetes mellitus     type II-- ORAL MED  . Prostate cancer SCHEDULED FOR RADIOACTIVE SEED IMPLANTS OF PROSTATE  05-18-2011    FOLLOWED BY DR [facility] AND DR Valere Dross  . CML (chronic myelocytic leukemia) FOLLOWED BY DR LEWIS ([place])    TAKES SPRYCEL PO MED FOR TX--  PT STATES STABLE  NO SYMPTOMS  . Iron deficiency anemia   . Chronic renal insufficiency   . PAD (peripheral artery disease)   . Cardiovascular disease with arteriosclerosis   . Blood transfusion LAST TIME NOV 2012  --  X2  UNITS  (FOR ANEMIA AND THROMBOCYTOPENIA  . Leukocytosis   . Peripheral edema   . History of thrombocytopenia   . Heart murmur   . CHF (congestive  heart failure) ADMISSION NOV 2012  [facility])  REQUESTED RECORDS    DIATOSLIC CHF WITH LEFT VENTRICULAR IMPAIRED RELAXATION-----   CARDIOLOGIST----  DR Dallie Piles  . Hypertension   . Arthritis   . History of angina   . Acute renal failure Admitted to [facility] 06/06/12 with hip fracture; creatinine 4.3, K 7.8. Aldactone and lisinopril stopped   Past Surgical History  Procedure Laterality Date  . Lung lobectomy  AGE 45 (APPROX.)    RIGHT LOWER LOBECTOMY 20 YEARS AGO, NOT CANCER (PROBLEM HEMORRHAGE)  . Hemorrhoid surgery    . Transthoracic echocardiogram  12-05-2010  [facility])    LEFT VENTRICULAR HYPERTROPHY / PRESERVED SYSTOLIC FUNCTION/ EF 63-01%/  IMPAIRED LEFT VENTRICULAR RELAXATION WITH LEFT ATRIAL ENLARGEMENT/ MILD MITRAL AND TRICUSPID REGURG.  . Cataract extraction w/ intraocular lens  implant, bilateral  2012  . Aortogram/ angioplasty and stenting of left common iliac artery  04-07-2007   DR FIELDS    LEFT COMMON ILIAC STENOSIS 80%, STENTED/ BILATERAL OCCLUSION ANTERIOR TIBIAL ARTERY/ BILATERAL INCOMPLETE PLANTAR ARCH FILLED BY PERONEAL AND POSTERIOR TIBIAL ARTERY/ DIFFUSE BUT PATENT SUPERFICIIAL FEMORAL ARTERIES BILATERLLY  . Cardiovascular stress test  05-19-2011  DR ROBERT KRASOWSKI    NO EVIDENCE OF ISCHEMIA/ NORMAL LVSF/ EF 55%  . Radioactive seed implant  07/21/2011    Procedure: RADIOACTIVE SEED IMPLANT;  Surgeon: Joie Bimler, MD;  Location: [facility];  Service: Urology;  Laterality: N/A;    72   seeds implanted  . Cystoscopy  07/21/2011    Procedure: CYSTOSCOPY;  Surgeon: Joie Bimler, MD;  Location: [facility];  Service: Urology;  Laterality: N/A;  no seeds found in bladder   Family History  Problem Relation Age of Onset  . Cancer Maternal Aunt     UNKNOWN CANCER DIED AGE 11   History  Substance Use Topics  . Smoking status: Former Smoker -- 1.50 packs/day for 20 years    Types: Cigarettes    Quit date: 11/19/1978  .  Smokeless tobacco: Never Used  . Alcohol Use: No    Review of Systems  Constitutional: Negative for fever and chills.  Respiratory: Negative for cough and shortness of breath.   Cardiovascular: Negative for chest pain and leg swelling.  Gastrointestinal: Positive for abdominal pain. Negative for vomiting.  All other systems reviewed and are negative.     Allergies  Review of patient's allergies indicates no known allergies.  Home Medications   Prior to Admission medications   Medication Sig Start Date End Date Taking? Authorizing Provider  acetaminophen (TYLENOL) 325 MG tablet Take 2 tablets (650 mg total) by mouth every 6 (six) hours as needed. 07/10/12  Yes Adeline Saralyn Pilar, MD  allopurinol (ZYLOPRIM) 300 MG tablet Take 0.5 tablets (150 mg total) by mouth daily. Patient taking differently: Take 300 mg by mouth daily.  07/10/12  Yes Adeline C Viyuoh, MD  amLODipine (NORVASC) 5 MG tablet Take 5 mg by mouth daily.   Yes Historical Provider, MD  aspirin 81 MG chewable tablet Chew 81 mg by mouth daily.    Yes Historical Provider, MD  atorvastatin (LIPITOR) 20 MG tablet Take 20 mg by mouth daily.   Yes Historical Provider, MD  clopidogrel (PLAVIX) 75 MG tablet Take 75 mg by mouth daily.   Yes Historical Provider, MD  finasteride (PROSCAR) 5 MG tablet Take 5 mg by mouth daily.   Yes Historical Provider, MD  FLORA-Q [facility]) CAPS capsule Take 1 capsule by mouth 2 (two) times daily.   Yes Historical Provider, MD  furosemide (LASIX) 40 MG tablet Take 40 mg by mouth daily.   Yes Historical Provider, MD  guaiFENesin (MUCINEX) 600 MG 12 hr tablet Take 1,200 mg by mouth 2 (two) times daily.   Yes Historical Provider, MD  hydrALAZINE (APRESOLINE) 10 MG tablet Take 10 mg by mouth 3 (three) times daily.   Yes Historical Provider, MD  insulin regular (NOVOLIN R,HUMULIN R) 100 units/mL injection Inject 0-10 Units into the skin 4 (four) times daily -  before meals and at bedtime. 151-200= 2  units 201-250= 4 units 251-300= 6 units 301-350= 8 units 351-400=10 units >400= call MD   Yes Historical Provider, MD  ipratropium-albuterol (DUONEB) 0.5-2.5 (3) MG/3ML SOLN Take 3 mLs by nebulization every 6 (six) hours as needed (wheezing).   Yes Historical Provider, MD  isosorbide mononitrate (IMDUR) 30 MG 24 hr tablet Take 30 mg by mouth every morning.    Yes Historical Provider, MD  metoprolol (LOPRESSOR) 12.5 mg TABS Take 1.5 tablets (37.5 mg total) by mouth 2 (two) times daily. Patient taking differently: Take 12.5 mg by mouth 2 (two) times daily.  07/10/12  Yes Adeline Saralyn Pilar, MD  nilotinib (TASIGNA) 150 MG capsule Take 300 mg by mouth every 12 (twelve) hours.   Yes Historical Provider, MD  oxyCODONE (OXY IR/ROXICODONE) 5 MG immediate release tablet Take 5 mg by  mouth 3 (three) times daily.   Yes Historical Provider, MD  pantoprazole (PROTONIX) 40 MG tablet Take 40 mg by mouth daily.   Yes Historical Provider, MD  pregabalin (LYRICA) 100 MG capsule Take 100 mg by mouth 2 (two) times daily.   Yes Historical Provider, MD  ranolazine (RANEXA) 500 MG 12 hr tablet Take 500 mg by mouth 2 (two) times daily.   Yes Historical Provider, MD  sennosides-docusate sodium (SENOKOT-S) 8.6-50 MG tablet Take 2 tablets by mouth 2 (two) times daily.   Yes Historical Provider, MD  Tamsulosin HCl (FLOMAX) 0.4 MG CAPS Take 0.4 mg by mouth 2 (two) times daily.    Yes Historical Provider, MD  famotidine (PEPCID) 40 MG tablet Take 1 tablet (40 mg total) by mouth daily. Patient not taking: Reported on 12/29/2013 07/10/12   Sheila Oats, MD  feeding supplement (ENSURE) PUDG Take 1 Container by mouth 3 (three) times daily between meals. Patient not taking: Reported on 12/24/2013 07/10/12   Sheila Oats, MD  feeding supplement (GLUCERNA SHAKE) LIQD Take 237 mLs by mouth daily at 12 noon. Patient not taking: Reported on 12/22/2013 07/11/12   Sheila Oats, MD  iron polysaccharides (NIFEREX) 150 MG capsule  Take 1 capsule (150 mg total) by mouth daily. Patient not taking: Reported on 01/02/2014 07/10/12   Sheila Oats, MD  magnesium oxide (MAG-OX) 400 (241.3 MG) MG tablet Take 1 tablet (400 mg total) by mouth daily. Patient not taking: Reported on 01/04/2014 07/10/12   Sheila Oats, MD  meclizine (ANTIVERT) 25 MG tablet Take 25 mg by mouth 3 (three) times daily as needed for dizziness.    Historical Provider, MD  sucralfate (CARAFATE) 1 GM/10ML suspension Take 10 mLs (1 g total) by mouth 4 (four) times daily -  with meals and at bedtime. Patient not taking: Reported on 12/24/2013 07/10/12   Sheila Oats, MD  traMADol (ULTRAM) 50 MG tablet Take 1 tablet (50 mg total) by mouth every 6 (six) hours as needed. 07/10/12   Sheila Oats, MD   Temp(Src) 98.1 F (36.7 C) (Oral)  Resp 19  SpO2 94% Physical Exam  Constitutional: He is oriented to person, place, and time. He appears well-developed and well-nourished. No distress.  HENT:  Head: Normocephalic and atraumatic.  Mouth/Throat: Oropharynx is clear and moist. No oropharyngeal exudate.  Eyes: EOM are normal. Pupils are equal, round, and reactive to light.  Neck: Normal range of motion. Neck supple.  Cardiovascular: Normal rate and regular rhythm.  Exam reveals no friction rub.   No murmur heard. Pulmonary/Chest: Effort normal and breath sounds normal. No respiratory distress. He has no wheezes. He has no rales.  Abdominal: He exhibits no distension. There is tenderness (mild, diffuse). There is no rebound.  Musculoskeletal: Normal range of motion. He exhibits edema (left arm with non-pitting edema diffusely).  Neurological: He is alert and oriented to person, place, and time.  Skin: No rash noted. He is not diaphoretic.  Nursing note and vitals reviewed.   ED Course  Procedures (including critical care time) Labs Review Labs Reviewed  CBC  COMPREHENSIVE METABOLIC PANEL  TROPONIN I  URINALYSIS, ROUTINE W REFLEX MICROSCOPIC     Imaging Review Dg Chest 2 View  12/17/2013   CLINICAL DATA:  Shortness of breath, altered mental status.  EXAM: CHEST  2 VIEW  COMPARISON:  Chest radiograph 12/14/2013  FINDINGS: Stable enlarged cardiac and mediastinal contours. Unchanged peripheral density within the left mid lung. Postoperative changes  within the right hemi thorax. There is increased consolidative opacity involving the right lung with decreased aeration. Probable moderate right pleural effusion.  IMPRESSION: Increase consolidative opacity within the right lung.  Likely increasing moderate right pleural effusion.   Electronically Signed   By: Lovey Newcomer M.D.   On: 12/15/2013 12:32     EKG Interpretation   Date/Time:  Sunday December 30 2013 10:30:12 EST Ventricular Rate:  86 PR Interval:  164 QRS Duration: 92 QT Interval:  371 QTC Calculation: 444 R Axis:   56 Text Interpretation:  Sinus rhythm Nonspecific T abnormalities, lateral  leads Similar to prior Confirmed by Mingo Amber  MD, [place] ([id number]) on 12/26/2013  11:39:38 AM     Angiocath insertion Performed by: Osvaldo Shipper  Consent: Verbal consent obtained. Risks and benefits: risks, benefits and alternatives were discussed Time out: Immediately prior to procedure a "time out" was called to verify the correct patient, procedure, equipment, support staff and site/side marked as required.  Preparation: Patient was prepped and draped in the usual sterile fashion.  Vein Location: R AC  Yes Ultrasound Guided  Gauge: 20  Normal blood return and flush without difficulty Patient tolerance: Patient tolerated the procedure well with no immediate complications.    MDM   Final diagnoses:  Altered mental status    67M sent from nursing home for altered mental status, hyperkalemia, renal failure. Hx of renal failure before. Here answering questions, listless, but talking, following commands. No fevers. Oxygen started this morning for his altered mental  status. Lab reports show K 6.4, creatinine around 4.  He does have history of renal failure previously.  Labs show hyperK, ARF. CXR c/w pneumonia. UA c/w UTI. Admitted to [facility].   Evelina Bucy, MD 12/15/2013 [phone]

## 2013-12-30 NOTE — Progress Notes (Addendum)
ANTIBIOTIC CONSULT NOTE - INITIAL  Pharmacy Consult for Vancomycin Indication: r/o PNA  No Known Allergies  Patient Measurements: Height:  68 inches (from 06/2012) Weight:  75kg (from 06/2012)  Vital Signs: Temp: 98.1 F (36.7 C) (12/20 1032) Temp Source: Oral (12/20 1032)  Labs:  Recent Labs  12/31/2013 1230  WBC 7.3  HGB 8.6*  PLT 147*   Medical History: Past Medical History  Diagnosis Date  . Hypertension   . Hypercholesterolemia   . GERD (gastroesophageal reflux disease)   . Dyspnea on exertion   . Claudication in peripheral vascular disease   . PVD (peripheral vascular disease)   . S/P renal artery angioplasty 2009--- W/ STENT X1  TO LEFT COMMON ILIAC    . ED (erectile dysfunction)   . History of BPH   . Frequency of urination   . Urgency of urination   . Nocturia   . Chronic gouty arthritis FEET  . Diabetes mellitus     type II-- [facility]AL MED  . Prostate cancer SCHEDULED FOR RADIOACTIVE SEED IMPLANTS OF PROSTATE  05-18-2011    FOLLOWED BY DR [facility] AND DR Valere Dross  . CML (chronic myelocytic leukemia) FOLLOWED BY DR LEWIS ([place])    TAKES SPRYCEL PO MED FOR TX--  PT STATES STABLE  NO SYMPTOMS  . Iron deficiency anemia   . Chronic renal insufficiency   . PAD (peripheral artery disease)   . Cardiovascular disease with arteriosclerosis   . Blood transfusion LAST TIME NOV 2012  --  X2  UNITS  (FOR ANEMIA AND THROMBOCYTOPENIA  . Leukocytosis   . Peripheral edema   . History of thrombocytopenia   . Heart murmur   . CHF (congestive heart failure) ADMISSION NOV 2012  [facility])  REQUESTED RECORDS    DIATOSLIC CHF WITH LEFT VENTRICULAR IMPAIRED RELAXATION-----   CARDIOLOGIST----  DR Dallie Piles  . Hypertension   . Arthritis   . History of angina   . Acute renal failure Admitted to [facility] 06/06/12 with hip fracture; creatinine 4.3, K 7.8. Aldactone and lisinopril stopped   Medications:  Anti-infectives    Start     Dose/Rate Route Frequency  Ordered Stop   01/06/2014 1330  vancomycin (VANCOCIN) IVPB 1000 mg/200 mL premix     1,000 mg200 mL/hr over 60 Minutes Intravenous  Once 12/24/2013 1315     12/18/2013 1300  ceFEPIme (MAXIPIME) 1 g in dextrose 5 % 50 mL IVPB     1 g100 mL/hr over 30 Minutes Intravenous  Once 12/17/2013 1248       Assessment: 73 yo male admitted with c/o feeling poorly and altered mental status from a SNF.  He has a hx. of renal failure and has hyperkalemia this admit.  He has had this in the past as well.  Goal of Therapy:  Vancomycin trough level 15-20 mcg/ml  Plan:  1.  Vancomycin 1gm IV now and then continue every 24 hours. 2.  Monitor renal fxn. and adjust as needed. 3.  F/U s/s levels and clinical response.  Rober Minion, PharmD., MS Clinical Pharmacist Pager:  [phone] Thank you for allowing pharmacy to be part of this patients care team. 01/02/2014,1:05 PM   Addendum: Pharmacy now consulted to add Cefepime for HCAP coverage. Patient received Cefepime 1g in ED ~1300. - Crcl ~24 ml/min  Plan 1. Cefepime 1g IV q24h 2. Monitor renal function, cultures, clinical course and adjust as indicated  Janina Mayo, PharmD Clinical Pharmacist [phone] 12/13/2013, 7:04 PM

## 2013-12-30 NOTE — Progress Notes (Signed)
MD made aware that patient is too drowsy to sit or stand for orthostatic vitals.  Per MD, reattempt in the AM.  Will make night shift RN aware and continue to monitor.

## 2013-12-30 NOTE — ED Notes (Signed)
Per [facility], pt from [facility] for elevated potassium, and kidney function tests. Pt is alert and oriented at this time. From facility on 3liters [place] with 24g to [facility] with 1/2 NS infusing.

## 2013-12-30 NOTE — H&P (Signed)
[facility] Admission History and Physical Service Pager: [phone]  Patient name: Dale Taylor Medical record number: [id number] Date of birth: September 28, 1940 Age: 73 y.o. Gender: male  Primary Care Provider: PROVIDER NOT IN SYSTEM Consultants: None  Code Status: Full  Chief Complaint: Acute renal failure per nursing facility  Assessment and Plan: Dale Taylor is a 73 y.o. male presenting with acute renal failure according to patient's nursing facility. PMH is significant for hypertension, Type 2 diabetes, chronic diastolic heart failure, hyperkalemia, HLD, prostate cancer, iron deficiency anemia, A. fib with RVR.  #Acute renal failure: Creatinine increase > 50% of baseline. (1.0 >> 3.3). Nonoliguric. Hyperkalemic. No inciting factor appreciated at this time. Patient has a suprapubic catheter, obstruction is unlikely.  - Admit to telemetry under the care of family medicine inpatient teaching service. - Lasix 40 mg IV in ED - Received 500 mL IV normal saline bolus in ED - Consider renal consult if not improving, may require CVVHD in light of hypotension - Follow-up BMP to determine trajectory of Cr and K  #Hyperkalemia: 6.1 on admission. Likely secondary to decreased GFR. No apparent precipitating medications. - Received 1 g calcium gluconate in ED - Received 60g Kayexalate in ED - Reassess with BMP - Follow-up EKG with no characteristic abnormalities; recheck in AM - Continue home senokot  Acute hypoxemic, hypercarbic respiratory failure: ABG:7.247/56.3/53.1. Likely due to CHF exacerbation, HCAP and right-sided pleural effusion. Remote history of RLL lobectomy due to hemorrhage.  - Consult Pulmonology - Order for BiPAP placed - Transfer to SDU - Hold all sedating medications (home opioids, lyrica) - Duonebs q6h prn  Acute exacerbation of HFpEF: Pro BNP 14k, hypervolemic; Echo 06/14 grade 1 DD, normal LVEF, mild LVH; ?dry weight ~75kg (at discharge of last  admission).  - Will need cautious diuresis in light of renal failure > consider HF team consult - Cycle cardiac markers - Daily weight, I/O - Hypotension > check random cortisol to r/o relative adrenal insufficiency  History of STEMI: large inferolateral scar on nuclear stress test 06/14 - As above  #HCAP: X-ray highly suggestive of left lower lobe consolidation. Patient has been residing within nursing facility. 1 / 4 SIRS and lactate 0.56. Would de-escalate if inflammatory markers remain normal and cultures negative. - IV vancomycin and cefepime per pharmacy - Blood cultures (12/20- ) - Check procalcitonin  - Monitor for fever - Monitor CBC  Urinary tract bacterial colonization: Await culture to prove infection with chronic indwelling suprapubic catheter. HCAP abx would cover most UTI pathogens anyway. - Follow-up urine cultures (12/20- )  #A. fib with RVR, history of: Rate controlled - Plavix, continue home - Monitor on telemetry  #Diabetes, type II: Not on any therapy prior to admission due to history of hypoglycemia due to [facility] tx in renal failure.  - Monitor CBGs q4h while NPO - Sliding-scale insulin, sensitive - A1c (5.5% in June)  Peripheral arterial disease: s/p stenting of left common iliac artery in 2009 and known occlusion of anterior tibial arteries with extensive collateral flow - Continue ASA, plavix, ranexa - Lipid panel  #Pancreatic cancer, history of - Continue home Allopurinol 150 mg daily - Continue home finasteride 5 mg daily  #CML, history of - Hold nilotinib 300 mg twice a day due to concern for QT prolongation in light of electrolyte derangements.   FEN/GI: NPO due to somnolence; IV saline lock Prophylaxis: Subcutaneous heparin  Disposition: Admitted to FMTS for further management of ARF, CHF exacerbation, and respiratory distress.  History of Present Illness: Dale Taylor is a 73 y.o. male presenting with acute renal failure secondary to patient's  nursing care facility. History was extremely limited secondary to patient being extremely somnolent at the time of exam. Patient was joined by her daughter and wife who were very unclear about the majority of patients previous and current health conditions. According to the family patient was brought in to the ED from the nursing facility due to "problems with his kidneys". They were unsure of how long this has been going on for.  ED obtain chest x-ray urinalysis and basic labs. Chest x-ray was concerning for pneumonia on the right side. As well as a possible increased right pleural effusion. UA was concerning for UTI with some bacteria, yeast, 2-3 WBCs per high-power field. CMP showed hyperkalemia of 6.1. Elevated creatinine of 3.3 (baseline 1.0-1.3). Patient was somnolent during interview, but would wake easily and answer questions appropriately, only to fall back asleep within 1 minute. Patient denied any other symptoms, however interview with patient was significantly limited secondary to mental status and lack of information from family.  Patient was admitted to inpatient teaching service, family medicine.   Review Of Systems: Per HPI Otherwise 12 point review of systems was performed and was unremarkable.  Patient Active Problem List   Diagnosis Date Noted  . Pneumonia 12/21/2013  . HCAP (healthcare-associated pneumonia) 12/28/2013  . Atrial fibrillation with RVR 07/05/2012  . Acute renal failure 06/30/2012  . Hyperkalemia 06/30/2012  . Iron deficiency anemia 06/30/2012  . Hypoglycemia 06/30/2012  . Altered mental status 06/30/2012  . HTN (hypertension) 06/30/2012  . Diabetes 06/30/2012  . Chronic diastolic heart failure 43/32/9518  . Intertrochanteric fracture of left hip 06/06/2012  . Urinary retention 06/06/2012  . Right lower lobe pneumonia 06/06/2012  . Prostate cancer 11/19/2010  . Hypercholesterolemia    Past Medical History: Past Medical History  Diagnosis Date  .  Hypertension   . Hypercholesterolemia   . GERD (gastroesophageal reflux disease)   . Dyspnea on exertion   . Claudication in peripheral vascular disease   . PVD (peripheral vascular disease)   . S/P renal artery angioplasty 2009--- W/ STENT X1  TO LEFT COMMON ILIAC    . ED (erectile dysfunction)   . History of BPH   . Frequency of urination   . Urgency of urination   . Nocturia   . Chronic gouty arthritis FEET  . Diabetes mellitus     type II-- [facility]  . Prostate cancer SCHEDULED FOR RADIOACTIVE SEED IMPLANTS OF PROSTATE  05-18-2011    FOLLOWED BY DR [facility] AND DR Valere Dross  . CML (chronic myelocytic leukemia) FOLLOWED BY DR LEWIS ([place])    TAKES [facility] PO MED FOR TX--  PT STATES STABLE  NO SYMPTOMS  . Iron deficiency anemia   . Chronic renal insufficiency   . PAD (peripheral artery disease)   . Cardiovascular disease with arteriosclerosis   . Blood transfusion LAST TIME NOV 2012  --  X2  UNITS  (FOR ANEMIA AND THROMBOCYTOPENIA  . Leukocytosis   . Peripheral edema   . History of thrombocytopenia   . Heart murmur   . CHF (congestive heart failure) ADMISSION NOV 2012  [facility])  REQUESTED RECORDS    DIATOSLIC CHF WITH LEFT VENTRICULAR IMPAIRED RELAXATION-----   CARDIOLOGIST----  DR Dallie Piles  . Hypertension   . Arthritis   . History of angina   . Acute renal failure Admitted to [facility] 06/06/12 with hip  fracture; creatinine 4.3, K 7.8. Aldactone and lisinopril stopped   Past Surgical History: Past Surgical History  Procedure Laterality Date  . Lung lobectomy  AGE 62 (APPROX.)    RIGHT LOWER LOBECTOMY 20 YEARS AGO, NOT CANCER (PROBLEM HEMORRHAGE)  . Hemorrhoid surgery    . Transthoracic echocardiogram  12-05-2010  [facility])    LEFT VENTRICULAR HYPERTROPHY / PRESERVED SYSTOLIC FUNCTION/ EF 44-81%/  IMPAIRED LEFT VENTRICULAR RELAXATION WITH LEFT ATRIAL ENLARGEMENT/ MILD MITRAL AND TRICUSPID REGURG.  . Cataract extraction w/ intraocular lens   implant, bilateral  2012  . Aortogram/ angioplasty and stenting of left common iliac artery  04-07-2007   DR FIELDS    LEFT COMMON ILIAC STENOSIS 80%, STENTED/ BILATERAL OCCLUSION ANTERIOR TIBIAL ARTERY/ BILATERAL INCOMPLETE PLANTAR ARCH FILLED BY PERONEAL AND POSTERIOR TIBIAL ARTERY/ DIFFUSE BUT PATENT SUPERFICIIAL FEMORAL ARTERIES BILATERLLY  . Cardiovascular stress test  05-19-2011  DR ROBERT KRASOWSKI    NO EVIDENCE OF ISCHEMIA/ NORMAL LVSF/ EF 55%  . Radioactive seed implant  07/21/2011    Procedure: RADIOACTIVE SEED IMPLANT;  Surgeon: Joie Bimler, MD;  Location: [facility];  Service: Urology;  Laterality: N/A;    72   seeds implanted  . Cystoscopy  07/21/2011    Procedure: CYSTOSCOPY;  Surgeon: Joie Bimler, MD;  Location: [facility];  Service: Urology;  Laterality: N/A;  no seeds found in bladder   Social History: History  Substance Use Topics  . Smoking status: Former Smoker -- 1.50 packs/day for 20 years    Types: Cigarettes    Quit date: 11/19/1978  . Smokeless tobacco: Never Used  . Alcohol Use: No   Additional social history: Patient currently residing in nursing facility.  Please also refer to relevant sections of EMR.  Family History: Family History  Problem Relation Age of Onset  . Cancer Maternal Aunt     UNKNOWN CANCER DIED AGE 96   Allergies and Medications: No Known Allergies No current facility-administered medications on file prior to encounter.   Current Outpatient Prescriptions on File Prior to Encounter  Medication Sig Dispense Refill  . acetaminophen (TYLENOL) 325 MG tablet Take 2 tablets (650 mg total) by mouth every 6 (six) hours as needed.    Marland Kitchen allopurinol (ZYLOPRIM) 300 MG tablet Take 0.5 tablets (150 mg total) by mouth daily. (Patient taking differently: Take 300 mg by mouth daily. )    . aspirin 81 MG chewable tablet Chew 81 mg by mouth daily.     . clopidogrel (PLAVIX) 75 MG tablet Take 75 mg by mouth daily.    .  finasteride (PROSCAR) 5 MG tablet Take 5 mg by mouth daily.    . hydrALAZINE (APRESOLINE) 10 MG tablet Take 10 mg by mouth 3 (three) times daily.    Marland Kitchen ipratropium-albuterol (DUONEB) 0.5-2.5 (3) MG/3ML SOLN Take 3 mLs by nebulization every 6 (six) hours as needed (wheezing).    . isosorbide mononitrate (IMDUR) 30 MG 24 hr tablet Take 30 mg by mouth every morning.     . metoprolol (LOPRESSOR) 12.5 mg TABS Take 1.5 tablets (37.5 mg total) by mouth 2 (two) times daily. (Patient taking differently: Take 12.5 mg by mouth 2 (two) times daily. )    . nilotinib (TASIGNA) 150 MG capsule Take 300 mg by mouth every 12 (twelve) hours.    . Tamsulosin HCl (FLOMAX) 0.4 MG CAPS Take 0.4 mg by mouth 2 (two) times daily.     . famotidine (PEPCID) 40 MG tablet Take 1 tablet (40  mg total) by mouth daily. (Patient not taking: Reported on 12/27/2013)    . feeding supplement (ENSURE) PUDG Take 1 Container by mouth 3 (three) times daily between meals. (Patient not taking: Reported on 12/24/2013)  0  . feeding supplement (GLUCERNA SHAKE) LIQD Take 237 mLs by mouth daily at 12 noon. (Patient not taking: Reported on 01/07/2014)  0  . iron polysaccharides (NIFEREX) 150 MG capsule Take 1 capsule (150 mg total) by mouth daily. (Patient not taking: Reported on 12/20/2013)    . magnesium oxide (MAG-OX) 400 (241.3 MG) MG tablet Take 1 tablet (400 mg total) by mouth daily. (Patient not taking: Reported on 12/17/2013)    . meclizine (ANTIVERT) 25 MG tablet Take 25 mg by mouth 3 (three) times daily as needed for dizziness.    . sucralfate (CARAFATE) 1 GM/10ML suspension Take 10 mLs (1 g total) by mouth 4 (four) times daily -  with meals and at bedtime. (Patient not taking: Reported on 12/12/2013) 420 mL 0  . traMADol (ULTRAM) 50 MG tablet Take 1 tablet (50 mg total) by mouth every 6 (six) hours as needed. 60 tablet 0    Objective: BP 124/50 mmHg  Pulse 88  Temp(Src) 97.8 F (36.6 C) (Oral)  Resp 14  Ht 5\' 11"  (1.803 m)  Wt 231  lb 4.2 oz (104.9 kg)  BMI 32.27 kg/m2  SpO2 100% Exam: General: Somnolent but rousable elderly gentleman  HEENT: Dry mucous membranes, [place] in place, arcus senilis Cardiovascular: RRR, no murmur, 2-3+ pitting edema to knees, JVD difficult to appreciate Respiratory: Diminished breath sounds, not moving good air; mildly labored, SpO2 100% on 6L O2 by [place]. Abdomen: +BS, soft, obese, NT, ND, indwelling suprapubic catheter without surrounding erythema Extremities: Pitting edema as above LUE > RUE, extensive hyperkeratosis if bilateral feet and long finger/toe nails, no cyanosis. Skin: Warm and dry without rashes or wounds Neuro: Oriented x4 upon arousal but falls asleep within 5 seconds during interview, speech nonaphasic, LUE weakness is baseline per family  Labs and Imaging: CBC BMET   Recent Labs Lab 12/11/2013 1230  WBC 7.3  HGB 8.6*  HCT 27.6*  PLT 147*    Recent Labs Lab 01/08/2014 1230  NA 138  K 6.1*  CL 102  CO2 24  BUN 52*  CREATININE 3.33*  GLUCOSE 118*  CALCIUM 8.2*       Elberta Leatherwood, MD 01/04/2014, 6:57 PM PGY-1, [place] [facility] pager: [phone], text pages welcome   I have seen and evaluated the patient with Dr. Alease Frame. I am in agreement with the note above in its revised form. My additions are in red.  Javohn Basey B. Bonner Puna, MD, PGY-2 12/31/2013 1:49 AM

## 2013-12-30 NOTE — ED Notes (Signed)
PER ADMITTING MD- do not restart fluid bolus that was stopped when IV infiltrated

## 2013-12-30 NOTE — Progress Notes (Signed)
Placed patient on Bipap 14/6 with oxygen set at 50%. B/S decreased on right side

## 2013-12-30 NOTE — Consult Note (Signed)
PULMONARY / CRITICAL CARE MEDICINE   Name: Kjuan Seipp MRN: [id number] DOB: 16-Apr-1940    ADMISSION DATE:  01/08/2014 CONSULTATION DATE:  12/20  REFERRING MD :  FPTS  CHIEF COMPLAINT:  Respiratory failure    INITIAL PRESENTATION: 73yo male with extensive PMH including hx CML, prostate ca, HTN, chronic dCHF, chronic suprapubic catheter, DM and CKD presented 12/20 from SNF with concern for "kidney problems". Admitted by [facility] teaching service with acute on chronic renal failure with hyperkalemia, HCAP +/- worsening R effusion and UTI.  PCCM consulted r/t respiratory acidosis and increasing somnolence.    STUDIES:  2D echo 12/20>>>  SIGNIFICANT EVENTS:    HISTORY OF PRESENT ILLNESS:  73yo male with extensive PMH including hx CML, prostate ca, HTN, chronic dCHF, chronic suprapubic catheter, DM and CKD presented 12/20 from SNF with concern for "kidney problems". Admitted by [facility] teaching service with acute on chronic renal failure with hyperkalemia, HCAP +/- worsening R effusion and UTI.  PCCM consulted r/t respiratory acidosis and increasing somnolence.  Pt states he feels somewhat better since he got to the hospital.  Was "feeling fine until yesterday".  Does c/o SOB but improved.  Also c/o orthopnea and increased BLE edema.  Denies chest pain, hemoptysis, fever.  Does have remote hx ?RMLobectomy.   PAST MEDICAL HISTORY :  Past Medical History  Diagnosis Date  . Hypertension   . Hypercholesterolemia   . GERD (gastroesophageal reflux disease)   . Dyspnea on exertion   . Claudication in peripheral vascular disease   . PVD (peripheral vascular disease)   . S/P renal artery angioplasty 2009--- W/ STENT X1  TO LEFT COMMON ILIAC    . ED (erectile dysfunction)   . History of BPH   . Frequency of urination   . Urgency of urination   . Nocturia   . Chronic gouty arthritis FEET  . Diabetes mellitus     type II-- ORAL MED  . Prostate cancer SCHEDULED FOR RADIOACTIVE SEED IMPLANTS OF PROSTATE   05-18-2011    FOLLOWED BY [facility] AND DR Valere Dross  . CML (chronic myelocytic leukemia) FOLLOWED BY DR LEWIS ([place])    TAKES SPRYCEL PO MED FOR TX--  PT STATES STABLE  NO SYMPTOMS  . Iron deficiency anemia   . Chronic renal insufficiency   . PAD (peripheral artery disease)   . Cardiovascular disease with arteriosclerosis   . Blood transfusion LAST TIME NOV 2012  --  X2  UNITS  (FOR ANEMIA AND THROMBOCYTOPENIA  . Leukocytosis   . Peripheral edema   . History of thrombocytopenia   . Heart murmur   . CHF (congestive heart failure) ADMISSION NOV 2012  [facility])  REQUESTED RECORDS    DIATOSLIC CHF WITH LEFT VENTRICULAR IMPAIRED RELAXATION-----   CARDIOLOGIST----  DR Dallie Piles  . Hypertension   . Arthritis   . History of angina   . Acute renal failure Admitted to [facility] 06/06/12 with hip fracture; creatinine 4.3, K 7.8. Aldactone and lisinopril stopped     has past surgical history that includes Lung lobectomy (AGE 79 (APPROX.)); Hemorrhoid surgery; transthoracic echocardiogram (12-05-2010  [facility])); Cataract extraction w/ intraocular lens  implant, bilateral (2012); AORTOGRAM/ ANGIOPLASTY AND STENTING OF LEFT COMMON ILIAC ARTERY (04-07-2007   DR FIELDS); Cardiovascular stress test (05-19-2011  DR Jenne Campus); Radioactive seed implant (07/21/2011); and Cystoscopy (07/21/2011). Prior to Admission medications   Medication Sig Start Date End Date Taking? Authorizing Provider  acetaminophen (TYLENOL) 325 MG tablet Take 2  tablets (650 mg total) by mouth every 6 (six) hours as needed. 07/10/12  Yes Adeline Saralyn Pilar, MD  allopurinol (ZYLOPRIM) 300 MG tablet Take 0.5 tablets (150 mg total) by mouth daily. Patient taking differently: Take 300 mg by mouth daily.  07/10/12  Yes Adeline C Viyuoh, MD  amLODipine (NORVASC) 5 MG tablet Take 5 mg by mouth daily.   Yes Historical Provider, MD  aspirin 81 MG chewable tablet Chew 81 mg by mouth daily.    Yes Historical  Provider, MD  atorvastatin (LIPITOR) 20 MG tablet Take 20 mg by mouth daily.   Yes Historical Provider, MD  clopidogrel (PLAVIX) 75 MG tablet Take 75 mg by mouth daily.   Yes Historical Provider, MD  finasteride (PROSCAR) 5 MG tablet Take 5 mg by mouth daily.   Yes Historical Provider, MD  FLORA-Q [facility]) CAPS capsule Take 1 capsule by mouth 2 (two) times daily.   Yes Historical Provider, MD  furosemide (LASIX) 40 MG tablet Take 40 mg by mouth daily.   Yes Historical Provider, MD  guaiFENesin (MUCINEX) 600 MG 12 hr tablet Take 1,200 mg by mouth 2 (two) times daily.   Yes Historical Provider, MD  hydrALAZINE (APRESOLINE) 10 MG tablet Take 10 mg by mouth 3 (three) times daily.   Yes Historical Provider, MD  insulin regular (NOVOLIN R,HUMULIN R) 100 units/mL injection Inject 0-10 Units into the skin 4 (four) times daily -  before meals and at bedtime. 151-200= 2 units 201-250= 4 units 251-300= 6 units 301-350= 8 units 351-400=10 units >400= call MD   Yes Historical Provider, MD  ipratropium-albuterol (DUONEB) 0.5-2.5 (3) MG/3ML SOLN Take 3 mLs by nebulization every 6 (six) hours as needed (wheezing).   Yes Historical Provider, MD  isosorbide mononitrate (IMDUR) 30 MG 24 hr tablet Take 30 mg by mouth every morning.    Yes Historical Provider, MD  metoprolol (LOPRESSOR) 12.5 mg TABS Take 1.5 tablets (37.5 mg total) by mouth 2 (two) times daily. Patient taking differently: Take 12.5 mg by mouth 2 (two) times daily.  07/10/12  Yes Adeline Saralyn Pilar, MD  nilotinib (TASIGNA) 150 MG capsule Take 300 mg by mouth every 12 (twelve) hours.   Yes Historical Provider, MD  oxyCODONE (OXY IR/ROXICODONE) 5 MG immediate release tablet Take 5 mg by mouth 3 (three) times daily.   Yes Historical Provider, MD  pantoprazole (PROTONIX) 40 MG tablet Take 40 mg by mouth daily.   Yes Historical Provider, MD  pregabalin (LYRICA) 100 MG capsule Take 100 mg by mouth 2 (two) times daily.   Yes Historical Provider, MD   ranolazine (RANEXA) 500 MG 12 hr tablet Take 500 mg by mouth 2 (two) times daily.   Yes Historical Provider, MD  sennosides-docusate sodium (SENOKOT-S) 8.6-50 MG tablet Take 2 tablets by mouth 2 (two) times daily.   Yes Historical Provider, MD  Tamsulosin HCl (FLOMAX) 0.4 MG CAPS Take 0.4 mg by mouth 2 (two) times daily.    Yes Historical Provider, MD  famotidine (PEPCID) 40 MG tablet Take 1 tablet (40 mg total) by mouth daily. Patient not taking: Reported on 12/24/2013 07/10/12   Sheila Oats, MD  feeding supplement (ENSURE) PUDG Take 1 Container by mouth 3 (three) times daily between meals. Patient not taking: Reported on 12/11/2013 07/10/12   Sheila Oats, MD  feeding supplement (GLUCERNA SHAKE) LIQD Take 237 mLs by mouth daily at 12 noon. Patient not taking: Reported on 12/27/2013 07/11/12   Sheila Oats, MD  iron  polysaccharides (NIFEREX) 150 MG capsule Take 1 capsule (150 mg total) by mouth daily. Patient not taking: Reported on 12/29/2013 07/10/12   Sheila Oats, MD  magnesium oxide (MAG-OX) 400 (241.3 MG) MG tablet Take 1 tablet (400 mg total) by mouth daily. Patient not taking: Reported on 01/05/2014 07/10/12   Sheila Oats, MD  meclizine (ANTIVERT) 25 MG tablet Take 25 mg by mouth 3 (three) times daily as needed for dizziness.    Historical Provider, MD  sucralfate (CARAFATE) 1 GM/10ML suspension Take 10 mLs (1 g total) by mouth 4 (four) times daily -  with meals and at bedtime. Patient not taking: Reported on 01/02/2014 07/10/12   Sheila Oats, MD  traMADol (ULTRAM) 50 MG tablet Take 1 tablet (50 mg total) by mouth every 6 (six) hours as needed. 07/10/12   Sheila Oats, MD   No Known Allergies  FAMILY HISTORY:  indicated that his mother is deceased. He indicated that his father is deceased. He indicated that his maternal grandmother is deceased.  SOCIAL HISTORY:  reports that he quit smoking about 35 years ago. His smoking use included Cigarettes. He has a 30  pack-year smoking history. He has never used smokeless tobacco. He reports that he does not drink alcohol or use illicit drugs.  REVIEW OF SYSTEMS:  As per HPI - All other systems reviewed and were neg.    SUBJECTIVE:   VITAL SIGNS: Temp:  [97.6 F (36.4 C)-98.2 F (36.8 C)] 97.6 F (36.4 C) (12/20 1955) Pulse Rate:  [85-88] 88 (12/20 1955) Resp:  [12-19] 16 (12/20 1955) BP: (102-128)/(45-59) 127/55 mmHg (12/20 1955) SpO2:  [91 %-100 %] 100 % (12/20 1955) Weight:  [231 lb 4.2 oz (104.9 kg)] 231 lb 4.2 oz (104.9 kg) (12/20 1612) HEMODYNAMICS:   VENTILATOR SETTINGS:   INTAKE / OUTPUT:  Intake/Output Summary (Last 24 hours) at 12/31/2013 2004 Last data filed at 12/29/2013 1956  Gross per 24 hour  Intake    500 ml  Output    525 ml  Net    -25 ml    PHYSICAL EXAMINATION: General:  Chronically ill appearing male, NAD  Neuro:  Drowsy but easily arousable, oriented x 3, answers appropriately, MAE HEENT:  *mm dry, no JVD  Cardiovascular:  s1s2 distant Lungs:  resps even, non labored on 4L [place], crackles L, diminished R Abdomen:  Soft, +bs, suprapubic catheter intact  Musculoskeletal:  Warm and dry, 2+ generalized pitting edema   LABS:  CBC  Recent Labs Lab 12/31/2013 1230  WBC 7.3  HGB 8.6*  HCT 27.6*  PLT 147*   Coag's No results for input(s): APTT, INR in the last 168 hours. BMET  Recent Labs Lab 01/09/2014 1230  NA 138  K 6.1*  CL 102  CO2 24  BUN 52*  CREATININE 3.33*  GLUCOSE 118*   Electrolytes  Recent Labs Lab 12/16/2013 1230  CALCIUM 8.2*   Sepsis Markers  Recent Labs Lab 12/20/2013 1453  LATICACIDVEN 0.56   ABG  Recent Labs Lab 12/18/2013 1745  PHART 7.247*  PCO2ART 56.3*  PO2ART 53.1*   Liver Enzymes  Recent Labs Lab 12/14/2013 1230  AST 20  ALT 27  ALKPHOS 106  BILITOT 0.5  ALBUMIN 3.0*   Cardiac Enzymes  Recent Labs Lab 12/13/2013 1230  TROPONINI <0.30   Glucose  Recent Labs Lab 12/11/2013 1225 12/31/2013 1707  GLUCAP 126*  156*    Imaging No results found.   ASSESSMENT / PLAN:  PULMONARY Acute respiratory  failure - multifactorial in setting ?HCAP, acute on chronic dCHF with volume overload and R pleural effusion c/b acute on chronic renal failure Respiratory acidosis  HCAP R pleural effusion  P:   bipap  Hold further ABG unless change in mental status  F/u CXR  Supplemental O2 as needed  Diuresis as below  pulm hygiene  PRN BD  abx as below    CARDIOVASCULAR Acute on dCHF  HTN  CAD  P:  Cont gentle diuresis for now as BP and Scr tol  2D echo  Trend troponin  F/u K  F/u EKG  Asa  Await f/u chem then likely further diuresis   RENAL Acute on chronic renal failure  Hyperkalemia  P:   Renal ultrasound  Bladder scan  F/u chem now - had Ca and kayexalate in ER Consider renal input - if no sig diuresis with lasix may need CVVHD for volume removal   GASTROINTESTINAL No active issue  P:   PPI   HEMATOLOGIC Chronic anemia  Hx CML  P:  F/u cbc  SQ heparin  hold home nilotinib for now - (has black box warning for qTc prolongation, now with hyperkalemia)  INFECTIOUS HCAP  ?UTI P:   BCx2 12/20>>> UC 12/20>>> Vancomycin 12/20>>> Cefepime 12/20>>>  ENDOCRINE DM  P:   SSI   NEUROLOGIC AMS - improved.  In setting hypercarbia +/- uremia.   P:   bipap  F/u ABG for mental status change  Hold all sedating medications  Hold home lyrica    FAMILY  - Updates: No family available 12/20  - Inter-disciplinary family meet or Palliative Care meeting due by:  12/27   Nickolas Madrid, NP 01/05/2014  8:04 PM Pager: [phone] or [phone]    Merton Border, MD ; [facility] service Mobile [phone].  After 5:30 PM or weekends, call [phone]

## 2013-12-30 NOTE — ED Notes (Signed)
Dr.Walden at the bedside.  

## 2013-12-30 NOTE — Progress Notes (Signed)
Patient arrived on unit from ED on 4L via nasal cannula.  Vital signs stable.  Patient drowsy but arousable to voice.  Patient alert and oriented x4.  MD at bedside for assessment.  Will continue to monitor.

## 2013-12-30 NOTE — ED Notes (Signed)
Pt remains in radiology 

## 2013-12-30 NOTE — Progress Notes (Signed)
Patient's family made aware that patient being transferred to higher level of care.  Patient's family had no questions or concerns at this time.  Will continue to monitor.

## 2013-12-31 ENCOUNTER — Inpatient Hospital Stay (HOSPITAL_COMMUNITY): Payer: Medicare HMO

## 2013-12-31 DIAGNOSIS — I2510 Atherosclerotic heart disease of native coronary artery without angina pectoris: Secondary | ICD-10-CM

## 2013-12-31 DIAGNOSIS — I4891 Unspecified atrial fibrillation: Secondary | ICD-10-CM

## 2013-12-31 DIAGNOSIS — I517 Cardiomegaly: Secondary | ICD-10-CM

## 2013-12-31 DIAGNOSIS — J9602 Acute respiratory failure with hypercapnia: Secondary | ICD-10-CM | POA: Insufficient documentation

## 2013-12-31 DIAGNOSIS — I509 Heart failure, unspecified: Secondary | ICD-10-CM

## 2013-12-31 LAB — LIPID PANEL
CHOLESTEROL: 182 mg/dL (ref 0–200)
HDL: 82 mg/dL (ref >39)
LDL Cholesterol: 82 mg/dL (ref 0–99)
Total CHOL/HDL Ratio: 2.2 RATIO
Triglycerides: 91 mg/dL (ref <150)
VLDL: 18 mg/dL (ref 0–40)

## 2013-12-31 LAB — CBC
HCT: 29.2 % — ABNORMAL LOW (ref 39.0–52.0)
Hemoglobin: 9.1 g/dL — ABNORMAL LOW (ref 13.0–17.0)
MCH: 28.8 pg (ref 26.0–34.0)
MCHC: 31.2 g/dL (ref 30.0–36.0)
MCV: 92.4 fL (ref 78.0–100.0)
PLATELETS: 98 10*3/uL — AB (ref 150–400)
RBC: 3.16 MIL/uL — AB (ref 4.22–5.81)
RDW: 17.1 % — ABNORMAL HIGH (ref 11.5–15.5)
WBC: 5.8 10*3/uL (ref 4.0–10.5)

## 2013-12-31 LAB — GLUCOSE, CAPILLARY
GLUCOSE-CAPILLARY: 107 mg/dL — AB (ref 70–99)
Glucose-Capillary: 132 mg/dL — ABNORMAL HIGH (ref 70–99)
Glucose-Capillary: 147 mg/dL — ABNORMAL HIGH (ref 70–99)
Glucose-Capillary: 162 mg/dL — ABNORMAL HIGH (ref 70–99)
Glucose-Capillary: 86 mg/dL (ref 70–99)
Glucose-Capillary: 91 mg/dL (ref 70–99)

## 2013-12-31 LAB — BASIC METABOLIC PANEL
ANION GAP: 12 (ref 5–15)
BUN: 52 mg/dL — ABNORMAL HIGH (ref 6–23)
CO2: 25 mEq/L (ref 19–32)
Calcium: 8.8 mg/dL (ref 8.4–10.5)
Chloride: 104 mEq/L (ref 96–112)
Creatinine, Ser: 3.06 mg/dL — ABNORMAL HIGH (ref 0.50–1.35)
GFR calc non Af Amer: 19 mL/min — ABNORMAL LOW (ref >90)
GFR, EST AFRICAN AMERICAN: 22 mL/min — AB (ref >90)
Glucose, Bld: 159 mg/dL — ABNORMAL HIGH (ref 70–99)
POTASSIUM: 4.9 meq/L (ref 3.7–5.3)
Sodium: 141 mEq/L (ref 137–147)

## 2013-12-31 LAB — TROPONIN I
Troponin I: <0.3 ng/mL (ref <0.30)
Troponin I: <0.3 ng/mL (ref <0.30)

## 2013-12-31 LAB — HEMOGLOBIN A1C
Hgb A1c MFr Bld: 6.6 % — ABNORMAL HIGH (ref <5.7)
Mean Plasma Glucose: 143 mg/dL — ABNORMAL HIGH (ref <117)

## 2013-12-31 LAB — CORTISOL: Cortisol, Plasma: 13.1 ug/dL

## 2013-12-31 MED ORDER — INSULIN ASPART 100 UNIT/ML ~~LOC~~ SOLN
0.0000 [IU] | SUBCUTANEOUS | Status: DC
  Administered 2013-12-31: 1 [IU] via SUBCUTANEOUS

## 2013-12-31 MED ORDER — AMIODARONE HCL IN DEXTROSE 360-4.14 MG/200ML-% IV SOLN
60.0000 mg/h | INTRAVENOUS | Status: AC
  Administered 2013-12-31 (×2): 60 mg/h via INTRAVENOUS
  Filled 2013-12-31 (×2): qty 200

## 2013-12-31 MED ORDER — AMIODARONE LOAD VIA INFUSION
150.0000 mg | Freq: Once | INTRAVENOUS | Status: AC
  Administered 2013-12-31: 150 mg via INTRAVENOUS
  Filled 2013-12-31: qty 83.34

## 2013-12-31 MED ORDER — FUROSEMIDE 10 MG/ML IJ SOLN
80.0000 mg | Freq: Two times a day (BID) | INTRAMUSCULAR | Status: DC
  Administered 2013-12-31: 80 mg via INTRAVENOUS
  Filled 2013-12-31: qty 8

## 2013-12-31 MED ORDER — INSULIN ASPART 100 UNIT/ML ~~LOC~~ SOLN
0.0000 [IU] | Freq: Three times a day (TID) | SUBCUTANEOUS | Status: DC
  Administered 2013-12-31: 2 [IU] via SUBCUTANEOUS

## 2013-12-31 MED ORDER — METOPROLOL TARTRATE 25 MG PO TABS
37.5000 mg | ORAL_TABLET | Freq: Two times a day (BID) | ORAL | Status: DC
  Administered 2013-12-31: 37.5 mg via ORAL
  Filled 2013-12-31 (×4): qty 1

## 2013-12-31 MED ORDER — VANCOMYCIN HCL 10 G IV SOLR
1500.0000 mg | INTRAVENOUS | Status: DC
  Administered 2013-12-31: 1500 mg via INTRAVENOUS
  Filled 2013-12-31: qty 1500

## 2013-12-31 MED ORDER — AMIODARONE HCL IN DEXTROSE 360-4.14 MG/200ML-% IV SOLN
30.0000 mg/h | INTRAVENOUS | Status: DC
  Administered 2013-12-31 – 2014-01-02 (×3): 30 mg/h via INTRAVENOUS
  Filled 2013-12-31 (×8): qty 200

## 2013-12-31 MED ORDER — FUROSEMIDE 10 MG/ML IJ SOLN: 80.0000 mg | Freq: Two times a day (BID) | INTRAMUSCULAR | Status: DC

## 2013-12-31 NOTE — Progress Notes (Signed)
Pt had coughing spell with increased HR 130's-170's and desated to 85%.  Pt also went into A-fib with RVR, Dr. Alva Garnet aware.  Pt placed back to Bipap.  Amiodarone bolus given and Amiodarone gtt started.

## 2013-12-31 NOTE — Evaluation (Signed)
Physical Therapy Evaluation Patient Details Name: Dale Taylor MRN: [id number] DOB: [birth year] Today's Date: 12/31/2013   History of Present Illness  Dale Taylor is a 73 y.o. male presenting with acute renal failure according to patient's nursing facility. PMH is significant for hypertension, Type 2 diabetes, chronic diastolic heart failure, hyperkalemia, HLD, prostate cancer, iron deficiency anemia, A. fib with RVR  Clinical Impression  Pt very pleasant and willing to mobilize but limited by weakness and bowel incontinence throughout session. Pt currently requires 2 person assist and unable to achieve fully upright with or without presence of RW and max cues and assist for posture and extension. Pt will benefit from acute therapy to maximize mobility, function, balance and safety to decrease burden of care.     Follow Up Recommendations SNF;Supervision/Assistance - 24 hour    Equipment Recommendations  None recommended by PT    Recommendations for Other Services       Precautions / Restrictions Precautions Precautions: Fall Restrictions Weight Bearing Restrictions: No      Mobility  Bed Mobility Overal bed mobility: Needs Assistance Bed Mobility: Supine to Sit     Supine to sit: Min assist     General bed mobility comments: HOB 30 degrees with min assist to pivot to EOB and scoot fully to EOB  Transfers Overall transfer level: Needs assistance   Transfers: Sit to/from Stand;Stand Pivot Transfers Sit to Stand: Max assist;+2 physical assistance;From elevated surface Stand pivot transfers: Max assist;+2 physical assistance;From elevated surface       General transfer comment: pt stood from bed with 2 person assist maintaining flexed bil UE, trunk and knees. Pt incontinent of stool with all standing trials and repeated stand x 5 trials from bed and chair secondary to incontinent stool. Pt unable to pivot to [facility] and placed bedpan in chair for pt with continued incontinence  with all movement and 3rd person to assist with pericare and linen change. Pt maintaiing flexed posture with bil knees blocked and fatigues quickly after grossly 20-30 sec each standing trial  Ambulation/Gait Ambulation/Gait assistance:  (pt unable)              Stairs            Wheelchair Mobility    Modified Rankin (Stroke Patients Only)       Balance Overall balance assessment: Needs assistance   Sitting balance-Leahy Scale: Fair       Standing balance-Leahy Scale: Zero                               Pertinent Vitals/Pain Pain Assessment: No/denies pain  sats 84-98% on 5L throughout     Home Living Family/patient expects to be discharged to:: Skilled nursing facility                      Prior Function Level of Independence: Needs assistance   Gait / Transfers Assistance Needed: pt reports he was walking limited distance <25' with RW PTA  ADL's / Homemaking Assistance Needed: total assist for ADLs at SNF        Hand Dominance        Extremity/Trunk Assessment   Upper Extremity Assessment: Generalized weakness           Lower Extremity Assessment: Generalized weakness      Cervical / Trunk Assessment: Kyphotic  Communication   Communication: No difficulties  Cognition Arousal/Alertness: Awake/alert Behavior During Therapy: [facility]  for tasks assessed/performed Overall Cognitive Status: Impaired/Different from baseline Area of Impairment: Safety/judgement;Awareness     Memory: Decreased short-term memory   Safety/Judgement: Decreased awareness of safety;Decreased awareness of deficits          General Comments      Exercises        Assessment/Plan    PT Assessment Patient needs continued PT services  PT Diagnosis Difficulty walking;Generalized weakness   PT Problem List Decreased strength;Decreased activity tolerance;Decreased balance;Decreased mobility;Decreased knowledge of precautions;Decreased  safety awareness;Decreased knowledge of use of DME;Obesity  PT Treatment Interventions Functional mobility training;Therapeutic activities;Therapeutic exercise;Balance training;DME instruction;Patient/family education   PT Goals (Current goals can be found in the Care Plan section) Acute Rehab PT Goals Patient Stated Goal: be able to walk PT Goal Formulation: With patient Time For Goal Achievement: 01/14/14 Potential to Achieve Goals: Fair    Frequency Min 2X/week   Barriers to discharge        Co-evaluation               End of Session Equipment Utilized During Treatment: Gait belt Activity Tolerance: Patient tolerated treatment well Patient left: in chair;with call bell/phone within reach;with nursing/sitter in room Nurse Communication: Mobility status;Precautions;Need for lift equipment         Time: 0832-0906 PT Time Calculation (min) (ACUTE ONLY): 34 min   Charges:   PT Evaluation $Initial PT Evaluation Tier I: 1 Procedure PT Treatments $Therapeutic Activity: 23-37 mins   PT G Codes:          Melford Aase 12/31/2013, 10:38 AM Elwyn Reach, [place]

## 2013-12-31 NOTE — Plan of Care (Signed)
Problem: Phase I Progression Outcomes Goal: Pain controlled with appropriate interventions Outcome: Progressing prn tylenol given today. Goal: OOB as tolerated unless otherwise ordered Outcome: Progressing Pt oob to chair with 2-person assist.

## 2013-12-31 NOTE — Progress Notes (Signed)
ANTIBIOTIC CONSULT NOTE - FOLLOW UP  Pharmacy Consult:  Vancomycin / Cefepime Indication:  HCAP  No Known Allergies  Patient Measurements: Height: 5\' 11"  (180.3 cm) Weight: 226 lb 13.7 oz (102.9 kg) IBW/kg (Calculated) : 75.3  Vital Signs: Temp: 97.7 F (36.5 C) (12/21 0758) Temp Source: Oral (12/21 0758) BP: 142/60 mmHg (12/21 0700) Pulse Rate: 97 (12/21 0700) Intake/Output from previous day: 12/20 0701 - 12/21 0700 In: 500 [P.O.:300; IV Piggyback:200] Out: 1975 [[id number]] Intake/Output from this shift: Total I/O In: -  Out: Elim:  Recent Labs  12/24/2013 1230 12/12/2013 1942 12/31/13 0140  WBC 7.3  --  5.8  HGB 8.6*  --  9.1*  PLT 147*  --  98*  CREATININE 3.33* 3.16* 3.06*   Estimated Creatinine Clearance: 26.2 mL/min (by C-G formula based on Cr of 3.06). No results for input(s): VANCOTROUGH, VANCOPEAK, VANCORANDOM, GENTTROUGH, GENTPEAK, GENTRANDOM, TOBRATROUGH, TOBRAPEAK, TOBRARND, AMIKACINPEAK, AMIKACINTROU, AMIKACIN in the last 72 hours.   Microbiology: Recent Results (from the past 720 hour(s))  MRSA PCR Screening     Status: None   Collection Time: 01/09/2014  4:41 PM  Result Value Ref Range Status   MRSA by PCR NEGATIVE NEGATIVE Final    Comment:        The GeneXpert MRSA Assay (FDA approved for NASAL specimens only), is one component of a comprehensive MRSA colonization surveillance program. It is not intended to diagnose MRSA infection nor to guide or monitor treatment for MRSA infections.       Assessment: 89 YOM admitted from SNF to continue on vancomycin and cefepime for HCAP.  Patient with AKI and BMI of 32.  He remains afebrile and his WBC is WNL.  Vanc 12/20 >> Cefepime 12/20 >>  12/20 UCx -  12/20 BCx x2 -   Goal of Therapy:  Vancomycin trough level 15-20 mcg/ml   Plan:  - Change vanc to 1500mg  IV Q48H per obesity nomogram - Continue Cefepime 1gm IV Q24H - Monitor renal fxn, clinical progress, vanc trough  as indicated    Abelina Ketron D. Mina Marble, PharmD, BCPS Pager:  [phone] 12/31/2013, 8:59 AM

## 2013-12-31 NOTE — Progress Notes (Signed)
Patient taken off of Bipap and placed on 6L nasal cannula.  Sats currently 93%.  Currently tolerating well.  RT will continue to monitor.

## 2013-12-31 NOTE — Consult Note (Signed)
CARDIOLOGY CONSULT NOTE   Patient ID: Dale Taylor MRN: [id number] DOB/AGE: September 29, 1940 73 y.o.  Admit date: 01/01/2014  Primary Physician   PROVIDER NOT [place] [facility]ologist   Followed in [place]. ( seen in consult by [facility] in 06/2012) Reason for Consultation   afib with rvr  HPI: Dale Taylor is a 73 y.o. male with a history of chronic diastolic heart failure, PVD s/p stenting of L common iliac artery (2009), HTN, DM, CKD, CML, prostate cancer, remote RLL lobectomy due to hemorrhage, PAF and iron def anemia who presented 12/20 from SNF with concern for "kidney problems". Admitted by teaching service with acute on chronic renal failure with hyperkalemia, HCAP, pleural effusion and acute on chronic diastolic CHF exacerbation leading to acute resp failure. The patient developed recurrent atrial fibrillation with RVR and cardiology was consulted.  [facility] was consulted for new onset afib with RVR in 06/2012 in the setting of acute on chronic renal failure and hyperkalemia. His troponin was noted to be elevated at the time and he underwent Lexiscan myoview which was negative for high risk ischemia. 2D ECHO with mild LVH, EF 55-60%, no RWMA. G1DD. There was redundancy of the atrial septum, with borderline criteria for aneurysm. He was not felt to be a  good long-term anticoagulation candidate for his a-fib with warfarin or a NOAC, despite an elevated CHADS2VASC score of 4, his HAS-BLED score is 4 as well - which predicts a 3-4% annual risk of stroke, but a 9% annual risk of serious bleeding. He had persistent anemia and may have underlying GI blood loss we are not aware of. Aspirin and plavix are good medical therapy for CAD and PAD - so I would keep both going at discharge.He was discharged with follow up with his cardiologist in [place].   Patient is currently on BIPAP and responsive but sedated. History is quite limited. No family is present. He denies CP and is still SOB.    Past  Medical History  Diagnosis Date  . Hypertension   . Hypercholesterolemia   . GERD (gastroesophageal reflux disease)   . Dyspnea on exertion   . Claudication in peripheral vascular disease   . PVD (peripheral vascular disease)   . S/P renal artery angioplasty 2009--- W/ STENT X1  TO LEFT COMMON ILIAC    . ED (erectile dysfunction)   . History of BPH   . Frequency of urination   . Urgency of urination   . Nocturia   . Chronic gouty arthritis FEET  . Diabetes mellitus     type II-- ORAL MED  . Prostate cancer SCHEDULED FOR RADIOACTIVE SEED IMPLANTS OF PROSTATE  05-18-2011    FOLLOWED BY DR [place] AND DR Valere Dross  . CML (chronic myelocytic leukemia) FOLLOWED BY DR LEWIS ([place])    TAKES SPRYCEL PO MED FOR TX--  PT STATES STABLE  NO SYMPTOMS  . Iron deficiency anemia   . Chronic renal insufficiency   . PAD (peripheral artery disease)   . Cardiovascular disease with arteriosclerosis   . Blood transfusion LAST TIME NOV 2012  --  X2  UNITS  (FOR ANEMIA AND THROMBOCYTOPENIA  . Leukocytosis   . Peripheral edema   . History of thrombocytopenia   . Heart murmur   . CHF (congestive heart failure) ADMISSION NOV 2012  [facility])  REQUESTED RECORDS    DIATOSLIC CHF WITH LEFT VENTRICULAR IMPAIRED RELAXATION-----   CARDIOLOGIST----  DR Dallie Piles  . Hypertension   .  Arthritis   . History of angina   . Acute renal failure Admitted to [facility] 06/06/12 with hip fracture; creatinine 4.3, K 7.8. Aldactone and lisinopril stopped     Past Surgical History  Procedure Laterality Date  . Lung lobectomy  AGE 50 (APPROX.)    RIGHT LOWER LOBECTOMY 20 YEARS AGO, NOT CANCER (PROBLEM HEMORRHAGE)  . Hemorrhoid surgery    . Transthoracic echocardiogram  12-05-2010  [facility])    LEFT VENTRICULAR HYPERTROPHY / PRESERVED SYSTOLIC FUNCTION/ EF 00-93%/  IMPAIRED LEFT VENTRICULAR RELAXATION WITH LEFT ATRIAL ENLARGEMENT/ MILD MITRAL AND TRICUSPID REGURG.  . Cataract extraction w/  intraocular lens  implant, bilateral  2012  . Aortogram/ angioplasty and stenting of left common iliac artery  04-07-2007   DR FIELDS    LEFT COMMON ILIAC STENOSIS 80%, STENTED/ BILATERAL OCCLUSION ANTERIOR TIBIAL ARTERY/ BILATERAL INCOMPLETE PLANTAR ARCH FILLED BY PERONEAL AND POSTERIOR TIBIAL ARTERY/ DIFFUSE BUT PATENT SUPERFICIIAL FEMORAL ARTERIES BILATERLLY  . Cardiovascular stress test  05-19-2011  DR ROBERT KRASOWSKI    NO EVIDENCE OF ISCHEMIA/ NORMAL LVSF/ EF 55%  . Radioactive seed implant  07/21/2011    Procedure: RADIOACTIVE SEED IMPLANT;  Surgeon: Joie Bimler, MD;  [facility];  Service: Urology;  Laterality: N/A;    72   seeds implanted  . Cystoscopy  07/21/2011    Procedure: CYSTOSCOPY;  Surgeon: Joie Bimler, MD;  Location: [facility];  Service: Urology;  Laterality: N/A;  no seeds found in bladder    No Known Allergies  I have reviewed the patient's current medications . antiseptic oral rinse  7 mL Mouth Rinse BID  . aspirin  81 mg Oral Daily  . clopidogrel  75 mg Oral Daily  . finasteride  5 mg Oral Daily  . heparin  5,000 Units Subcutaneous 3 times per day  . insulin aspart  0-15 Units Subcutaneous TID WC  . metoprolol tartrate  37.5 mg Oral BID  . pantoprazole (PROTONIX) IV  40 mg Intravenous Q24H  . senna-docusate  2 tablet Oral BID   . amiodarone 60 mg/hr (12/31/13 1335)   Followed by  . amiodarone     sodium chloride, acetaminophen, ipratropium-albuterol, sodium chloride  Prior to Admission medications   Medication Sig Start Date End Date Taking? Authorizing Provider  acetaminophen (TYLENOL) 325 MG tablet Take 2 tablets (650 mg total) by mouth every 6 (six) hours as needed. 07/10/12  Yes Adeline Saralyn Pilar, MD  allopurinol (ZYLOPRIM) 300 MG tablet Take 0.5 tablets (150 mg total) by mouth daily. Patient taking differently: Take 300 mg by mouth daily.  07/10/12  Yes Adeline C Viyuoh, MD  amLODipine (NORVASC) 5 MG tablet Take 5  mg by mouth daily.   Yes Historical Provider, MD  aspirin 81 MG chewable tablet Chew 81 mg by mouth daily.    Yes Historical Provider, MD  atorvastatin (LIPITOR) 20 MG tablet Take 20 mg by mouth daily.   Yes Historical Provider, MD  clopidogrel (PLAVIX) 75 MG tablet Take 75 mg by mouth daily.   Yes Historical Provider, MD  finasteride (PROSCAR) 5 MG tablet Take 5 mg by mouth daily.   Yes Historical Provider, MD  FLORA-Q [facility]) CAPS capsule Take 1 capsule by mouth 2 (two) times daily.   Yes Historical Provider, MD  furosemide (LASIX) 40 MG tablet Take 40 mg by mouth daily.   Yes Historical Provider, MD  guaiFENesin (MUCINEX) 600 MG 12 hr tablet Take 1,200 mg by mouth 2 (two) times daily.  Yes Historical Provider, MD  hydrALAZINE (APRESOLINE) 10 MG tablet Take 10 mg by mouth 3 (three) times daily.   Yes Historical Provider, MD  insulin regular (NOVOLIN R,HUMULIN R) 100 units/mL injection Inject 0-10 Units into the skin 4 (four) times daily -  before meals and at bedtime. 151-200= 2 units 201-250= 4 units 251-300= 6 units 301-350= 8 units 351-400=10 units >400= call MD   Yes Historical Provider, MD  ipratropium-albuterol (DUONEB) 0.5-2.5 (3) MG/3ML SOLN Take 3 mLs by nebulization every 6 (six) hours as needed (wheezing).   Yes Historical Provider, MD  isosorbide mononitrate (IMDUR) 30 MG 24 hr tablet Take 30 mg by mouth every morning.    Yes Historical Provider, MD  metoprolol (LOPRESSOR) 12.5 mg TABS Take 1.5 tablets (37.5 mg total) by mouth 2 (two) times daily. Patient taking differently: Take 12.5 mg by mouth 2 (two) times daily.  07/10/12  Yes Adeline Saralyn Pilar, MD  nilotinib (TASIGNA) 150 MG capsule Take 300 mg by mouth every 12 (twelve) hours.   Yes Historical Provider, MD  oxyCODONE (OXY IR/ROXICODONE) 5 MG immediate release tablet Take 5 mg by mouth 3 (three) times daily.   Yes Historical Provider, MD  pantoprazole (PROTONIX) 40 MG tablet Take 40 mg by mouth daily.   Yes Historical  Provider, MD  pregabalin (LYRICA) 100 MG capsule Take 100 mg by mouth 2 (two) times daily.   Yes Historical Provider, MD  ranolazine (RANEXA) 500 MG 12 hr tablet Take 500 mg by mouth 2 (two) times daily.   Yes Historical Provider, MD  sennosides-docusate sodium (SENOKOT-S) 8.6-50 MG tablet Take 2 tablets by mouth 2 (two) times daily.   Yes Historical Provider, MD  Tamsulosin HCl (FLOMAX) 0.4 MG CAPS Take 0.4 mg by mouth 2 (two) times daily.    Yes Historical Provider, MD  famotidine (PEPCID) 40 MG tablet Take 1 tablet (40 mg total) by mouth daily. Patient not taking: Reported on 01/06/2014 07/10/12   Sheila Oats, MD  feeding supplement (ENSURE) PUDG Take 1 Container by mouth 3 (three) times daily between meals. Patient not taking: Reported on 12/23/2013 07/10/12   Sheila Oats, MD  feeding supplement (GLUCERNA SHAKE) LIQD Take 237 mLs by mouth daily at 12 noon. Patient not taking: Reported on 12/21/2013 07/11/12   Sheila Oats, MD  iron polysaccharides (NIFEREX) 150 MG capsule Take 1 capsule (150 mg total) by mouth daily. Patient not taking: Reported on 01/04/2014 07/10/12   Sheila Oats, MD  magnesium oxide (MAG-OX) 400 (241.3 MG) MG tablet Take 1 tablet (400 mg total) by mouth daily. Patient not taking: Reported on 01/01/2014 07/10/12   Sheila Oats, MD  meclizine (ANTIVERT) 25 MG tablet Take 25 mg by mouth 3 (three) times daily as needed for dizziness.    Historical Provider, MD  sucralfate (CARAFATE) 1 GM/10ML suspension Take 10 mLs (1 g total) by mouth 4 (four) times daily -  with meals and at bedtime. Patient not taking: Reported on 12/31/2013 07/10/12   Sheila Oats, MD  traMADol (ULTRAM) 50 MG tablet Take 1 tablet (50 mg total) by mouth every 6 (six) hours as needed. 07/10/12   Sheila Oats, MD     History   Social History  . Marital Status: Married    Spouse Name: ERNESTINE    Number of Children: 7  . Years of Education: N/A   Occupational History  .  RETIRED    Social History Main Topics  . Smoking status:  Former Smoker -- 1.50 packs/day for 20 years    Types: Cigarettes    Quit date: 11/19/1978  . Smokeless tobacco: Never Used  . Alcohol Use: No  . Drug Use: No  . Sexual Activity: Not on file   Other Topics Concern  . Not on file   Social History Narrative    Family Status  Relation Status Death Age  . Maternal Grandmother Deceased   . Mother Deceased   . Father Deceased    Family History  Problem Relation Age of Onset  . Cancer Maternal Aunt     UNKNOWN CANCER DIED AGE 47     ROS:  Full 14 point review of systems complete and found to be negative unless listed above.  Physical Exam: Blood pressure 134/50, pulse 108, temperature 97.6 F (36.4 C), temperature source Oral, resp. rate 23, height 5\' 11"  (1.803 m), weight 226 lb 13.7 oz (102.9 kg), SpO2 87 %.  General: Well developed, well nourished, male in no acute distress Head: Eyes PERRLA, No xanthomas.   Normocephalic and atraumatic, oropharynx without edema or exudate. Dentition:  Lungs: diffuse faint wheezing on respiratory examination on BIPAP Heart: HRRR S1 S2, no rub/gallop, Heart irregular rate and rhythm with S1, S2  murmur. pulses are 2+ extrem.   Neck: No carotid bruits. No lymphadenopathy.  + JVD. Abdomen: Bowel sounds present, abdomen soft and non-tender without masses or hernias noted. Msk:  No spine or cva tenderness. No weakness, no joint deformities or effusions. Extremities: No clubbing or cyanosis.  + Anasarca Neuro: Alert and oriented X 3. No focal deficits noted. Psych:  Good affect, responds appropriately Skin: No rashes or lesions noted.  Labs:   Lab Results  Component Value Date   WBC 5.8 12/31/2013   HGB 9.1* 12/31/2013   HCT 29.2* 12/31/2013   MCV 92.4 12/31/2013   PLT 98* 12/31/2013   No results for input(s): INR in the last 72 hours.  Recent Labs Lab 01/07/2014 1230  12/31/13 0140  NA 138  < > 141  K 6.1*  < > 4.9  CL 102  <  > 104  CO2 24  < > 25  BUN 52*  < > 52*  CREATININE 3.33*  < > 3.06*  CALCIUM 8.2*  < > 8.8  PROT 6.0  --   --   BILITOT 0.5  --   --   ALKPHOS 106  --   --   ALT 27  --   --   AST 20  --   --   GLUCOSE 118*  < > 159*  ALBUMIN 3.0*  --   --   < > = values in this interval not displayed. MAGNESIUM  Date Value Ref Range Status  01/07/2014 2.3 1.5 - 2.5 mg/dL Final    Recent Labs  12/23/2013 1230 01/05/2014 1942 12/31/13 0140 12/31/13 0805  TROPONINI <0.30 <0.30 <0.30 <0.30    PRO B NATRIURETIC PEPTIDE (BNP)  Date/Time Value Ref Range Status  01/02/2014 07:42 PM 14048.0* 0 - 125 pg/mL Final   Lab Results  Component Value Date   CHOL 182 12/31/2013   HDL 82 12/31/2013   LDLCALC 82 12/31/2013   TRIG 91 12/31/2013    Echo: 2D ECHO 07/07/14 LV EF: 55% -   60% Study Conclusions - Left ventricle: The cavity size was normal. Wall thickness   was increased in a pattern of mild LVH. Systolic function   was normal. The estimated ejection fraction was in the  range of 55% to 60%. Wall motion was normal; there were no   regional wall motion abnormalities. Doppler parameters are   consistent with abnormal left ventricular relaxation   (grade 1 diastolic dysfunction). - Atrial septum: There was redundancy of the septum, with   borderline criteria for aneurysm.   ECG:  HR 110 Sinus tachycardia with occasional Premature ventricular complexes Possible Left atrial enlargement ST & T wave abnormality, consider inferolateral ischemia  Radiology:  Dg Chest 2 View  12/21/2013   CLINICAL DATA:  Shortness of breath, altered mental status.  EXAM: CHEST  2 VIEW  COMPARISON:  Chest radiograph 12/14/2013  FINDINGS: Stable enlarged cardiac and mediastinal contours. Unchanged peripheral density within the left mid lung. Postoperative changes within the right hemi thorax. There is increased consolidative opacity involving the right lung with decreased aeration. Probable moderate right pleural  effusion.  IMPRESSION: Increase consolidative opacity within the right lung.  Likely increasing moderate right pleural effusion.   Electronically Signed   By: Lovey Newcomer M.D.   On: 12/29/2013 12:32   US Renal Port  12/31/2013   CLINICAL DATA:  Renal failure  EXAM: RENAL/URINARY TRACT ULTRASOUND COMPLETE  COMPARISON:  Renal ultrasound 06/30/2012  FINDINGS: Right Kidney:  Length: 10.9 cm. Echogenicity within normal limits. No mass or hydronephrosis visualized.  Left Kidney:  Length: 10.6 cm. Echogenicity within normal limits. No mass or hydronephrosis visualized.  Bladder:  Foley catheter.  Urine bladder empty.  IMPRESSION: Negative renal ultrasound.   Electronically Signed   By: Franchot Gallo M.D.   On: 12/31/2013 09:59     ASSESSMENT AND PLAN:    Principal Problem:   HCAP (healthcare-associated pneumonia) Active Problems:   Acute renal failure   Hyperkalemia   HTN (hypertension)   Diabetes   Chronic diastolic heart failure   Pneumonia   Acute respiratory failure with hypercapnia   Jmichael Gille is a 73 y.o. male with a history of chronic diastolic heart failure, PVD s/p stenting of L common iliac artery (2009), HTN, DM, CKD, CML, prostate cancer, remote RLL lobectomy due to hemorrhage, PAF and iron def anemia who presented 12/20 from SNF with concern for "kidney problems". Admitted by teaching service with acute on chronic renal failure with hyperkalemia, HCAP, pleural effusion and acute on chronic diastolic CHF exacerbation leading to acute resp failure. The patient developed recurrent atrial fibrillation with RVR and cardiology was consulted.    Afib with RVR- now converted on amio load and infusion. Home lopressor 37.5 mg BID restarted   -- CHADSVASc score at least 5 (HTN 1, CAD/PVD 1, age 20, CHF 1, DM 1) which is a 7.2 annual risk of stroke. He has a HASBLED (HTN 1, renal dz 1, predisposition to bleed 1, age 77, on antiplatet med 1)  score of 5 which is ~9% risk of serious bleed per year.   -- Still agree with original consult in 06/2012 that his risk of bleed outweighs the risk of ischemic stroke. Would hold off on AC at this point. See MD's note for final recommendations.   Acute exacerbation of HFpEF: Pro BNP 14k, hypervolemic; Echo 06/14 grade 1 DD, normal LVEF, mild LVH; ?dry weight ~75kg (at discharge of last admission). 104.9kg on admission. -- Given IV Lasix 80mg  x2 and 40mg  x1. Net neg 1.3L, weight down 2 kg 104.9--> 102.9 kg. -- Cautious diuresis in setting of ARF. Creat slightly improved with diuresis 3.33--> 3.06 -- PCCM planning on tapping moderate R pleural effusion  -- f/u 2D  Echo pending -- Daily weight, I/O  CAD- History of STEMI??: large inferolateral scar on nuclear stress test 06/14 -- Some ST depression noted on ECG especially with high rates -- Troponin neg x4 -- Continue ASA and plavix, BB  -- No ischemic eval now  Acute renal failure/Hyperkalemia: Cr 3.3 on admission (baseline ~1).  Patient has a suprapubic catheter, obstruction is unlikely. Renal US negative. Initially hyperkalemic (6.1) secondary to ARF, now improving.  -- S/p Kaexylate in the ED -- Diuresis with IV 80mg  lasix  Acute hypoxemic, hypercarbic respiratory failure: On admission, ABG:7.247/56.3/53.1. Multifactorial in setting of CHF exacerbation, HCAP and right-sided pleural effusion. Remote history of RLL lobectomy due to hemorrhage. Patient required BiPAP on admission. Respiratory status now improving with diuresis and IV antibiotics. Still on BIPIAP  HCAP: CXR highly suggestive of left lower lobe consolidation and patient has been residing within nursing facility. Patient only meets 1 SIRS criteria on admission with normal range lactate and low procalcitonin (0.15), however would continue IV vancomycin and cefepime for now given patient's tenuous respiratory status. Can consider de-escalating antibiotics if patient's respiratory status continues to improve.  -- Blood cultures (12/20-  )  Thrombocytopenia. PLT 147 >98. Patient on vancomycin which can lower platelets. Does not meet criteria for HIT. -- f/u AM CBC, if PLT continue to trend down, will stop heparin and place SCDs and consider HIT work up  Diabetes, type II: Not on any therapy prior to admission due to history of hypoglycemia due to [facility] tx in renal failure. A1c 6.6 here  Peripheral arterial disease: s/p stenting of left common iliac artery in 2009 and known occlusion of anterior tibial arteries with extensive collateral flow -- Continue ASA, plavix  CML, history of -- Hold nilotinib 300 mg twice a day due to concern for QT prolongation in light of electrolyte derangements.  -- Continue home Allopurinol 150 mg daily  Anemia- chronic. H/H 9.1/29.2    Signed: Crista Luria 12/31/2013 2:18 PM Patient seen and examined. I agree with the assessment and plan as detailed above. See also my additional thoughts below.   I have reviewed all of the data outlined above in person with Angelena Form PA-C. I agree with the assessment and plan as outlined. Historically the patient develops atrial fib with a rapid rate when he becomes ill. Fortunately he has already converted to sinus rhythm. At the time of the last consult done at this hospital, it was decided that aggressive ischemic workup was not needed. It was also decided that anticoagulation would not be recommended. I agree with the same plan at this time. He will be most prudent to diuresis him watching his renal function carefully. I would not continue amiodarone long-term. I would not do any other cardiac workup other than the two-dimensional echo that is pending.  Dola Argyle, MD, [facility] 12/31/2013 4:18 PM

## 2013-12-31 NOTE — Progress Notes (Signed)
PULMONARY / CRITICAL CARE MEDICINE   Name: Dale Taylor MRN: [id number] DOB: 1940/08/14    ADMISSION DATE:  01/09/2014 CONSULTATION DATE:  12/20  REFERRING MD :  FPTS  CHIEF COMPLAINT:  Respiratory failure    INITIAL PRESENTATION: 73yo male with extensive PMH including hx CML, prostate ca, HTN, chronic dCHF, chronic suprapubic catheter, DM and CKD presented 12/20 from SNF with concern for "kidney problems". Admitted by [facility] teaching service with acute on chronic renal failure with hyperkalemia, HCAP +/- worsening R effusion and UTI.  PCCM consulted r/t respiratory acidosis and increasing somnolence.    STUDIES:  2D echo 12/20>>>  SIGNIFICANT EVENTS:     SUBJECTIVE:  PAF with RVR, cycling on and off NPPV. Cognition intact  VITAL SIGNS: Temp:  [97.3 F (36.3 C)-98.2 F (36.8 C)] 98.2 F (36.8 C) (12/21 1601) Pulse Rate:  [72-149] 73 (12/21 1704) Resp:  [12-24] 23 (12/21 1704) BP: (93-149)/(50-68) 96/57 mmHg (12/21 1704) SpO2:  [87 %-100 %] 96 % (12/21 1704) FiO2 (%):  [40 %-50 %] 50 % (12/21 1704) Weight:  [102.9 kg (226 lb 13.7 oz)] 102.9 kg (226 lb 13.7 oz) (12/21 0500) HEMODYNAMICS:   VENTILATOR SETTINGS: Vent Mode:  [-]  FiO2 (%):  [40 %-50 %] 50 % INTAKE / OUTPUT:  Intake/Output Summary (Last 24 hours) at 12/31/13 1757 Last data filed at 12/31/13 1700  Gross per 24 hour  Intake 1352.11 ml  Output   2835 ml  Net -1482.89 ml    PHYSICAL EXAMINATION: General:  On BIPAP, mild dyspnea @ rest Neuro:  RASS 0, no focal deficits HEENT: WNL Cardiovascular: IRIR, tachy, no M Lungs: markedly diminished BS throughout on R Abdomen:  Obese, soft, +bs, suprapubic catheter intact  Ext:  Warm and dry, 2+ generalized pitting edema   LABS: I have reviewed all of today's lab results. Relevant abnormalities are discussed in the A/P section  CXR: Markedly reduced volume on R with probable effusion  ASSESSMENT / PLAN:  PULMONARY Acute on chronic hypercarbic respiratory  failure R pleural effusion Suspect pulm edema  P:   Cont PRN BiPAP Cont nebulized BDs PRN   CARDIOVASCULAR CHF - diastolic PAF with RVR HTN  CAD - cardiac markers negative P:  Cards consult 12/21 Amiodarone initiated 12/21 Resume home dose of metoprolol Hold off on chronic anticoagulation per Cards rec  RENAL Acute on chronic renal failure  Hyperkalemia  P:   Renal ultrasound  Monitor BMET intermittently Monitor I/Os Correct electrolytes as indicated  GASTROINTESTINAL obesity  P:   Cont PPI Advance diet as able  HEMATOLOGIC Chronic anemia  Hx CML  Thrombocytopenia P:  F/u cbc  SQ heparin  hold home nilotinib for now   INFECTIOUS ? HCAP - most of CXR findings are chronic and PCT is very low ?UTI P:   BCx2 12/20>>> UC 12/20>>> Vancomycin 12/20 >> 12/21 Cefepime 12/20 >> 12/21  ENDOCRINE DM 2 P:   Cont SSI   NEUROLOGIC AMS, hypercarbic - resolved P:   Hold all sedating medications  Hold home lyrica   Family updated @ bedside Discussed with FPTS - will take on PCCM service while in ICU Consider further assessment of suspected R effusion 12/22  Merton Border, MD ; [facility] service Mobile [phone].  After 5:30 PM or weekends, call [phone]

## 2013-12-31 NOTE — Progress Notes (Signed)
Patient had coughing spell and had increased heart rate of 160s and decreased sats of 87%.  Placed patient back on Bipap.  Currently tolerating well.  RT will continue to monitor.

## 2013-12-31 NOTE — Progress Notes (Signed)
  Echocardiogram 2D Echocardiogram has been performed.  Landry Mellow, RDMS, RVT  12/31/2013, 2:57 PM

## 2013-12-31 NOTE — Progress Notes (Signed)
The patient was transferred to [phone] (ICU) on Bipap per MD.  He is lethargic and falls asleep easily, but is awoken via voice and is orientedx4.  His VS were stable at the time of transfer.

## 2013-12-31 NOTE — Progress Notes (Signed)
Family Medicine Teaching Service Daily Progress Note Intern Pager: [phone]  Patient name: Dale Taylor Medical record number: [id number] Date of birth: [birth year] Age: 73 y.o. Gender: male  Primary Care Provider: PROVIDER NOT IN SYSTEM Consultants: Pulmonology Code Status: Full  Pt Overview and Major Events to Date:  12/20 - Admitted with ARF, CHF exacerbation, HCAP leading to acute respiratory failure   Assessment and Plan: Dale Taylor is a 73 y.o. male presenting with acute renal failure according to patient's nursing facility. PMH is significant for hypertension, Type 2 diabetes, chronic diastolic heart failure, hyperkalemia, HLD, prostate cancer, iron deficiency anemia, A. fib with RVR.  #Acute renal failure/Hyperkalemia: Cr 3.3 on admission (baseline ~1). Likely cardiorenal syndrome (see problems below). Patient has a suprapubic catheter, obstruction is unlikely. Renal US negative. Initially hyperkalemic (6.1) secondary to ARF, now improving.  - Diuresis with IV 80mg  lasix - Consider renal consult if not improving, may require CVVHD - Trend BMP, closely monitor and correct electrolytes as needed  Acute hypoxemic, hypercarbic respiratory failure: On admission, ABG:7.247/56.3/53.1. Multifactorial in setting of CHF exacerbation, HCAP and right-sided pleural effusion. Remote history of RLL lobectomy due to hemorrhage. Patient required BiPAP on admission. Respiratory status now improving with diuresis and IV antibiotics.  - Pulmonology consulted, appreciate assistance - Weaned to [place], continue to wean as tolerated - Hold all sedating medications (home opioids, lyrica) - Diuresis as below - Duonebs q6h prn  Acute exacerbation of HFpEF: Pro BNP 14k, hypervolemic; Echo 06/14 grade 1 DD, normal LVEF, mild LVH; ?dry weight ~75kg (at discharge of last admission). 104.9kg on admission. Ischemic work up negative.  - Cautious diuresis in setting of ARF (c/s Heart Failure team if unable to diurese  2/2 renal function.  - f/u 2D Echo - Daily weight, I/O  History of STEMI: large inferolateral scar on nuclear stress test 06/14 - Daily aspirin 81mg   #HCAP: CXR highly suggestive of left lower lobe consolidation and patient has been residing within nursing facility. Patient only meets 1 SIRS criteria on admission with normal range lactate and low procalcitonin (0.15), however would continue IV vancomycin and cefepime for now given patient's tenuous respiratory status. Can consider de-escalating antibiotics if patient's respiratory status continues to improve.  - IV vancomycin and cefepime per pharmacy (12/20- ) - Blood cultures (12/20- ) - Monitor for fever - Monitor CBC  #Thrombocytopenia. PLT 147 >98. Patient on vancomycin which can lower platelets. Does not meet criteria for HIT. -f/u AM CBC, if PLT continue to trend down, will stop heparin and place SCDs and consider HIT work up  Urinary tract bacterial colonization: Await culture to prove infection with chronic indwelling suprapubic catheter. HCAP abx would cover most UTI pathogens anyway. - Follow-up urine cultures (12/20- )  #A. fib with RVR, history of: Rate controlled - Plavix, continue home - Monitor on telemetry  #Diabetes, type II: Not on any therapy prior to admission due to history of hypoglycemia due to [facility] tx in renal failure. A1c 6.6 here - SSI, CBGs with meals  Peripheral arterial disease: s/p stenting of left common iliac artery in 2009 and known occlusion of anterior tibial arteries with extensive collateral flow - Continue ASA, plavix, ranexa - Lipid panel  #Prostate cancer, history of - Continue home finasteride 5 mg daily  #CML, history of - Hold nilotinib 300 mg twice a day due to concern for QT prolongation in light of electrolyte derangements.  - Continue home Allopurinol 150 mg daily  FEN/GI: Carb modified diet; IV saline lock  Prophylaxis: Subcutaneous heparin  Disposition: Admitted to step down  pending improvement in respiratory status  Subjective:  Dyspnea improved this morning. Has not noticed much change in extremity edema. No fevers. Still with cough.   Objective: Temp:  [97.3 F (36.3 C)-98.2 F (36.8 C)] 97.4 F (36.3 C) (12/21 0339) Pulse Rate:  [85-102] 102 (12/21 0500) Resp:  [12-23] 19 (12/21 0500) BP: (102-142)/(45-66) 131/61 mmHg (12/21 0500) SpO2:  [91 %-100 %] 98 % (12/21 0500) FiO2 (%):  [40 %-50 %] 50 % (12/21 0400) Weight:  [226 lb 13.7 oz (102.9 kg)-231 lb 4.2 oz (104.9 kg)] 226 lb 13.7 oz (102.9 kg) (12/21 0500) Physical Exam: General: Awake and alert elderly gentleman in NAD sitting in chair Cardiovascular: RRR, no murmurs appreciated Respiratory: NWOB, Randall in place, diminished breath sounds Abdomen: +BS, soft, NT, ND Extremities: No cyanosis, 2+ pitting edema in LE and UE  Laboratory:  Recent Labs Lab 12/29/2013 1230 12/31/13 0140  WBC 7.3 5.8  HGB 8.6* 9.1*  HCT 27.6* 29.2*  PLT 147* 98*    Recent Labs Lab 12/23/2013 1230 01/02/2014 1942 12/31/13 0140  NA 138 138 141  K 6.1* 5.7* 4.9  CL 102 101 104  CO2 24 24 25   BUN 52* 53* 52*  CREATININE 3.33* 3.16* 3.06*  CALCIUM 8.2* 8.4 8.8  PROT 6.0  --   --   BILITOT 0.5  --   --   ALKPHOS 106  --   --   ALT 27  --   --   AST 20  --   --   GLUCOSE 118* 167* 159*    Troponin negative x3 Mg 2.3 A1c 6.6% Procalcitonin 0.15 ProBNP 14048  Imaging/Diagnostic Tests: None new  Dimas Chyle, MD 12/31/2013, 6:45 AM PGY-1, [place] Intern pager: [phone], text pages welcome

## 2013-12-31 NOTE — Progress Notes (Signed)
[facility] PCCM PROGRESS NOTE  Evaluated patient with bedside ultrasound for thoracentesis, however I was not able to find a large enough window for the procedure to be safely performed. The opacification seen on CXR appears to be predominately consolidation from this study, less effusion. Will defer thoracentesis at this time.

## 2014-01-01 ENCOUNTER — Inpatient Hospital Stay (HOSPITAL_COMMUNITY): Payer: Medicare HMO

## 2014-01-01 DIAGNOSIS — J9819 Other pulmonary collapse: Secondary | ICD-10-CM

## 2014-01-01 LAB — CBC
HCT: 30.8 % — ABNORMAL LOW (ref 39.0–52.0)
Hemoglobin: 9.4 g/dL — ABNORMAL LOW (ref 13.0–17.0)
MCH: 28.1 pg (ref 26.0–34.0)
MCHC: 30.5 g/dL (ref 30.0–36.0)
MCV: 91.9 fL (ref 78.0–100.0)
Platelets: 135 10*3/uL — ABNORMAL LOW (ref 150–400)
RBC: 3.35 MIL/uL — ABNORMAL LOW (ref 4.22–5.81)
RDW: 16.9 % — AB (ref 11.5–15.5)
WBC: 7.6 10*3/uL (ref 4.0–10.5)

## 2014-01-01 LAB — COMPREHENSIVE METABOLIC PANEL
ALBUMIN: 3 g/dL — AB (ref 3.5–5.2)
ALT: 36 U/L (ref 0–53)
AST: 29 U/L (ref 0–37)
Alkaline Phosphatase: 95 U/L (ref 39–117)
Anion gap: 11 (ref 5–15)
BUN: 50 mg/dL — ABNORMAL HIGH (ref 6–23)
CALCIUM: 8.5 mg/dL (ref 8.4–10.5)
CHLORIDE: 107 meq/L (ref 96–112)
CO2: 24 mmol/L (ref 19–32)
CREATININE: 2.47 mg/dL — AB (ref 0.50–1.35)
GFR calc Af Amer: 28 mL/min — ABNORMAL LOW (ref >90)
GFR calc non Af Amer: 24 mL/min — ABNORMAL LOW (ref >90)
Glucose, Bld: 110 mg/dL — ABNORMAL HIGH (ref 70–99)
Potassium: 3.9 mmol/L (ref 3.5–5.1)
Sodium: 142 mmol/L (ref 135–145)
Total Bilirubin: 0.5 mg/dL (ref 0.3–1.2)
Total Protein: 5.3 g/dL — ABNORMAL LOW (ref 6.0–8.3)

## 2014-01-01 LAB — GLUCOSE, CAPILLARY
GLUCOSE-CAPILLARY: 108 mg/dL — AB (ref 70–99)
Glucose-Capillary: 104 mg/dL — ABNORMAL HIGH (ref 70–99)
Glucose-Capillary: 88 mg/dL (ref 70–99)
Glucose-Capillary: 108 mg/dL — ABNORMAL HIGH (ref 70–99)

## 2014-01-01 LAB — URINE CULTURE

## 2014-01-01 LAB — TROPONIN I: TROPONIN I: 1.42 ng/mL — AB (ref <0.031)

## 2014-01-01 LAB — PRO B NATRIURETIC PEPTIDE: B NATRIURETIC PEPTIDE 5: 2382.6 pg/mL — AB (ref 0.0–100.0)

## 2014-01-01 LAB — PROCALCITONIN: Procalcitonin: 0.21 ng/mL

## 2014-01-01 LAB — TSH: TSH: 0.871 u[IU]/mL (ref 0.350–4.500)

## 2014-01-01 MED ORDER — CETYLPYRIDINIUM CHLORIDE 0.05 % MT LIQD: 7.0000 mL | Freq: Four times a day (QID) | OROMUCOSAL | Status: DC

## 2014-01-01 MED ORDER — FENTANYL CITRATE 0.05 MG/ML IJ SOLN
INTRAMUSCULAR | Status: AC
  Filled 2014-01-01: qty 4

## 2014-01-01 MED ORDER — ETOMIDATE 2 MG/ML IV SOLN
20.0000 mg | Freq: Once | INTRAVENOUS | Status: AC
  Administered 2014-01-01: 20 mg via INTRAVENOUS

## 2014-01-01 MED ORDER — DEXTROSE 5 % IV SOLN
1.0000 g | INTRAVENOUS | Status: DC
  Administered 2014-01-01 – 2014-01-05 (×5): 1 g via INTRAVENOUS
  Filled 2014-01-01 (×5): qty 1

## 2014-01-01 MED ORDER — FENTANYL CITRATE 0.05 MG/ML IJ SOLN
150.0000 ug | Freq: Once | INTRAMUSCULAR | Status: AC
  Administered 2014-01-01: 150 ug via INTRAVENOUS

## 2014-01-01 MED ORDER — FENTANYL CITRATE 0.05 MG/ML IJ SOLN
50.0000 ug | INTRAMUSCULAR | Status: DC | PRN
  Administered 2014-01-01: 50 ug via INTRAVENOUS

## 2014-01-01 MED ORDER — MIDAZOLAM HCL 2 MG/2ML IJ SOLN
4.0000 mg | Freq: Once | INTRAMUSCULAR | Status: AC
  Administered 2014-01-01: 4 mg via INTRAVENOUS

## 2014-01-01 MED ORDER — CHLORHEXIDINE GLUCONATE 0.12 % MT SOLN
15.0000 mL | Freq: Two times a day (BID) | OROMUCOSAL | Status: DC
Start: 2014-01-01 — End: 2014-01-03
  Administered 2014-01-01 – 2014-01-03 (×5): 15 mL via OROMUCOSAL
  Filled 2014-01-01 (×5): qty 15

## 2014-01-01 MED ORDER — METOPROLOL TARTRATE 25 MG PO TABS
37.5000 mg | ORAL_TABLET | Freq: Two times a day (BID) | ORAL | Status: DC
  Administered 2014-01-01 – 2014-01-03 (×4): 37.5 mg
  Filled 2014-01-01 (×5): qty 1

## 2014-01-01 MED ORDER — PROPOFOL 10 MG/ML IV EMUL
5.0000 ug/kg/min | INTRAVENOUS | Status: DC
  Administered 2014-01-01: 80 ug/kg/min via INTRAVENOUS
  Administered 2014-01-01: 60 ug/kg/min via INTRAVENOUS
  Administered 2014-01-01: 80 ug/kg/min via INTRAVENOUS
  Administered 2014-01-02: 35 ug/kg/min via INTRAVENOUS
  Administered 2014-01-02: 20 ug/kg/min via INTRAVENOUS
  Administered 2014-01-02 (×2): 30 ug/kg/min via INTRAVENOUS
  Administered 2014-01-03: 20 ug/kg/min via INTRAVENOUS
  Filled 2014-01-01 (×9): qty 100

## 2014-01-01 MED ORDER — ACETAMINOPHEN 325 MG PO TABS: 650.0000 mg | ORAL_TABLET | Freq: Four times a day (QID) | ORAL | Status: DC | PRN

## 2014-01-01 MED ORDER — CHLORHEXIDINE GLUCONATE 0.12 % MT SOLN: 15.0000 mL | Freq: Two times a day (BID) | OROMUCOSAL | Status: DC

## 2014-01-01 MED ORDER — CLOPIDOGREL BISULFATE 75 MG PO TABS
75.0000 mg | ORAL_TABLET | Freq: Every day | ORAL | Status: DC
  Administered 2014-01-02 – 2014-01-03 (×2): 75 mg
  Filled 2014-01-01 (×2): qty 1

## 2014-01-01 MED ORDER — FUROSEMIDE 10 MG/ML IJ SOLN
80.0000 mg | Freq: Once | INTRAMUSCULAR | Status: AC
  Administered 2014-01-01: 80 mg via INTRAVENOUS
  Filled 2014-01-01: qty 8

## 2014-01-01 MED ORDER — ALBUTEROL SULFATE (2.5 MG/3ML) 0.083% IN NEBU
2.5000 mg | INHALATION_SOLUTION | RESPIRATORY_TRACT | Status: DC | PRN
  Filled 2014-01-01 (×2): qty 3

## 2014-01-01 MED ORDER — MIDAZOLAM HCL 2 MG/2ML IJ SOLN
INTRAMUSCULAR | Status: AC
  Filled 2014-01-01: qty 4

## 2014-01-01 MED ORDER — PROPOFOL 10 MG/ML IV EMUL
INTRAVENOUS | Status: AC
  Filled 2014-01-01: qty 100

## 2014-01-01 MED ORDER — PANTOPRAZOLE SODIUM 40 MG PO TBEC: 40.0000 mg | DELAYED_RELEASE_TABLET | Freq: Every day | ORAL | Status: DC

## 2014-01-01 MED ORDER — ALBUTEROL SULFATE (2.5 MG/3ML) 0.083% IN NEBU
2.5000 mg | INHALATION_SOLUTION | Freq: Four times a day (QID) | RESPIRATORY_TRACT | Status: DC
  Administered 2014-01-01 – 2014-01-03 (×8): 2.5 mg via RESPIRATORY_TRACT
  Filled 2014-01-01 (×8): qty 3

## 2014-01-01 MED ORDER — FENTANYL CITRATE 0.05 MG/ML IJ SOLN
50.0000 ug | INTRAMUSCULAR | Status: DC | PRN
  Administered 2014-01-03: 50 ug via INTRAVENOUS
  Filled 2014-01-01: qty 2

## 2014-01-01 MED ORDER — INSULIN ASPART 100 UNIT/ML ~~LOC~~ SOLN
0.0000 [IU] | SUBCUTANEOUS | Status: DC
  Administered 2014-01-02 – 2014-01-03 (×2): 2 [IU] via SUBCUTANEOUS

## 2014-01-01 MED ORDER — ASPIRIN 81 MG PO CHEW
81.0000 mg | CHEWABLE_TABLET | Freq: Every day | ORAL | Status: DC
  Administered 2014-01-02 – 2014-01-03 (×2): 81 mg
  Filled 2014-01-01 (×2): qty 1

## 2014-01-01 MED ORDER — PANTOPRAZOLE SODIUM 40 MG IV SOLR
40.0000 mg | INTRAVENOUS | Status: DC
  Administered 2014-01-01 – 2014-01-02 (×2): 40 mg via INTRAVENOUS
  Filled 2014-01-01 (×2): qty 40

## 2014-01-01 MED ORDER — CETYLPYRIDINIUM CHLORIDE 0.05 % MT LIQD
7.0000 mL | Freq: Four times a day (QID) | OROMUCOSAL | Status: DC
  Administered 2014-01-01 – 2014-01-03 (×9): 7 mL via OROMUCOSAL

## 2014-01-01 NOTE — Progress Notes (Signed)
0830 Dr. Alva Garnet notified of Trop level of 1.42

## 2014-01-01 NOTE — Procedures (Signed)
Intubation Procedure Note Dale Taylor [id number] 1940/12/24  Procedure: Intubation Indications: Respiratory insufficiency  Procedure Details Consent: Risks of procedure as well as the alternatives and risks of each were explained to the (patient/caregiver).  Consent for procedure obtained. Time Out: Verified patient identification, verified procedure, site/side was marked, verified correct patient position, special equipment/implants available, medications/allergies/relevent history reviewed, required imaging and test results available.  Performed  Maximum sterile technique was used including gloves, hand hygiene and mask.  MAC and 4  Meds: 100 mcg sublimaze 2 mg midazolam 20 mg etomidate   Evaluation Hemodynamic Status: BP stable throughout; O2 sats: stable throughout Patient's Current Condition: stable Complications: No apparent complications Patient did tolerate procedure well. Chest X-ray ordered to verify placement.  CXR: pending.  [name], ACNP [place] Pulmonology/Critical Care Pager [phone] or [phone]   I was present for and supervised the entire procedure  Merton Border, MD ; [facility] service Mobile [phone].  After 5:30 PM or weekends, call [phone]

## 2014-01-01 NOTE — Progress Notes (Signed)
Consultation reviewed. Has converted to sinus. Echo shows EF 40-45% with mid to apical anteroseptal severe hypokinesis, which is a new finding. Non-ischemic NST in 2014 -?inferolateral scar. He is progressively thrombocytopenic and has significant acute renal failure, therefore is not a good cath candidate at this time. Agree with supportive care as suggested by Dr. Ron Parker. Cardiology will be available as needed.  Pixie Casino, MD, [facility] Attending Cardiologist [place]

## 2014-01-01 NOTE — Procedures (Signed)
Central Venous Catheter Insertion Procedure Note Dale Taylor [id number] 1940-02-27  Procedure: Insertion of Central Venous Catheter Indications: Drug and/or fluid administration and Frequent blood sampling  Procedure Details Consent: Risks of procedure as well as the alternatives and risks of each were explained to the (patient/caregiver).  Consent for procedure obtained. Time Out: Verified patient identification, verified procedure, site/side was marked, verified correct patient position, special equipment/implants available, medications/allergies/relevent history reviewed, required imaging and test results available.  Performed  Maximum sterile technique was used including antiseptics, cap, gloves, gown, hand hygiene, mask and sheet. Skin prep: Chlorhexidine; local anesthetic administered A antimicrobial bonded/coated triple lumen catheter was placed in the left internal jugular vein using the Seldinger technique. Ultrasound guidance used.Yes.   Catheter placed to 22 cm. Blood aspirated via all 3 ports and then flushed x 3. Line sutured x 2 and dressing applied.  Evaluation Blood flow good Complications: No apparent complications Patient did tolerate procedure well. Chest X-ray ordered to verify placement.  CXR: pending.  [name], ACNP  Pulmonology/Critical Care Pager [phone] or [phone]   I was present for and supervised the entire procedure  Merton Border, MD ; [facility] service Mobile [phone].  After 5:30 PM or weekends, call [phone]

## 2014-01-01 NOTE — Clinical Social Work Note (Signed)
Clinical Social Worker awaiting a returned phone call from pt's wife and daughter, Philis Nettle to complete psychosocial assessment. CSW will continue to follow pt and pt's family for disposition needs.   Glendon Axe, MSW, LCSWA [phone] 01/01/2014 12:41 PM

## 2014-01-01 NOTE — Procedures (Signed)
Intubation Procedure Note Dale Taylor [id number] March 05, 1940  Procedure: Intubation Indications: Airway protection and maintenance  Procedure Details Consent: Risks of procedure as well as the alternatives and risks of each were explained to the (patient/caregiver).  Consent for procedure obtained. Time Out: Verified patient identification, verified procedure, site/side was marked, verified correct patient position, special equipment/implants available, medications/allergies/relevent history reviewed, required imaging and test results available.  Performed  Maximum sterile technique was used including gloves, hand hygiene and mask.  MAC and 4    Evaluation Hemodynamic Status: BP stable throughout; O2 sats: stable throughout Patient's Current Condition: stable Complications: No apparent complications Patient did tolerate procedure well. Chest X-ray ordered to verify placement.  CXR: pending.   [place] Parma D 01/01/2014

## 2014-01-01 NOTE — Progress Notes (Signed)
[name] was sitting up in bed talking in full sentences. Reported that BiPAP was tried but he doesn't want to wear it so he was wearing a venturi mask when I saw him.   We appreciate the care provided to [name] by the CCM service. Once he is stable to be transferred to the floor we will gladly accept him onto our service.   Rosemarie Ax, MD PGY-2, [place] [facility] 01/01/2014, 9:04 AM FPTS Service pager: [phone] (text pages welcome through [facility])

## 2014-01-01 NOTE — Clinical Social Work Note (Signed)
Clinical Social Worker has assessed patient and pt's family. Full psychosocial to follow. CSW will continue to follow pt and pt's family for discharge needs.   Glendon Axe, MSW, LCSWA [phone] 01/01/2014 12:49 PM

## 2014-01-01 NOTE — Progress Notes (Signed)
RT placed pt back on BIPAP for increased WOB and accessory muscle use. Patient tolerating well at this time. Vital signs stable. No complications. RT will continue to monitor.

## 2014-01-01 NOTE — Progress Notes (Signed)
PULMONARY / CRITICAL CARE MEDICINE   Name: Dale Taylor MRN: [id number] DOB: 1940/10/05    ADMISSION DATE:  12/23/2013 CONSULTATION DATE:  12/20  REFERRING MD :  FPTS  CHIEF COMPLAINT:  Respiratory failure    INITIAL PRESENTATION: 73yo male with extensive PMH including hx CML, prostate ca, HTN, chronic dCHF, chronic suprapubic catheter, DM and CKD presented 12/20 from SNF with concern for "kidney problems". Admitted by [facility] teaching service with acute on chronic renal failure with hyperkalemia, HCAP +/- worsening R effusion and UTI.  PCCM consulted r/t respiratory acidosis and increasing somnolence.    STUDIES:  12/21 TTE: The cavity size was mildly dilated. Wall thickness was normal. Systolic function was mildly to moderately reduced. The estimated ejection fraction was in the range of 40% to 45%. There is severe hypokinesis of the mid-apicalanteroseptal and apical myocardium. Doppler parameters are consistent with abnormal left ventricular relaxation (grade 1 diastolicdysfunction). 12/21 Renal US: negative 12/22 CT chest  SIGNIFICANT EVENTS: 12/21 PAF with RVR. Amiodarone started. Converted to NSR. Cards consult obtained. Recommended continuing current rx. No anticoagulation given high risk 12/22 Increased dyspnea. White out of R hemithorax on CXR. Intubated. Copious purulent secretions from ETT.    SUBJECTIVE:  As above  VITAL SIGNS: Temp:  [97.7 F (36.5 C)-99.2 F (37.3 C)] 99.2 F (37.3 C) (12/22 1128) Pulse Rate:  [58-94] 66 (12/22 1700) Resp:  [0-28] 10 (12/22 1700) BP: (86-148)/(40-73) 114/46 mmHg (12/22 1700) SpO2:  [87 %-100 %] 100 % (12/22 1700) FiO2 (%):  [40 %-70 %] 40 % (12/22 1518) Weight:  [103.7 kg (228 lb 9.9 oz)] 103.7 kg (228 lb 9.9 oz) (12/22 0500) HEMODYNAMICS:   VENTILATOR SETTINGS: Vent Mode:  [-] PRVC FiO2 (%):  [40 %-70 %] 40 % Set Rate:  [15 bmp] 15 bmp Vt Set:  [500 mL-600 mL] 600 mL PEEP:  [5 cmH20] 5 cmH20 Plateau Pressure:  [18 DGL87-56  cmH20] 24 cmH20 INTAKE / OUTPUT:  Intake/Output Summary (Last 24 hours) at 01/01/14 1703 Last data filed at 01/01/14 1425  Gross per 24 hour  Intake    582 ml  Output    935 ml  Net   -353 ml    PHYSICAL EXAMINATION: General:  On BIPAP, mild dyspnea @ rest Neuro:  RASS 0, no focal deficits HEENT: WNL Cardiovascular: IRIR, tachy, no M Lungs: markedly diminished BS throughout on R Abdomen:  Obese, soft, +bs, suprapubic catheter intact  Ext:  Warm and dry, 2+ generalized pitting edema   LABS: I have reviewed all of today's lab results. Relevant abnormalities are discussed in the A/P section  CXR: Markedly reduced volume on R with probable effusion  ASSESSMENT / PLAN:  PULMONARY Acute on chronic hypercarbic respiratory failure R pleural effusion Suspect pulm edema  R lung collapse P:   Cont full vent support - settings reviewed and/or adjusted Cont vent bundle Daily SBT if/when meets criteria CT chest to image pleural space and L lung Consider FOB 12/23  CARDIOVASCULAR CHF - diastolic PAF with RVR HTN  CAD - cardiac markers negative P:  Cont amiodarone initiated 12/21 Cont home dose of metoprolol Hold off on chronic anticoagulation  RENAL Acute on chronic renal failure, improving Hyperkalemia  P:   Monitor BMET intermittently Monitor I/Os Correct electrolytes as indicated  GASTROINTESTINAL obesity  P:   Cont PPI TFs  HEMATOLOGIC Chronic anemia  Hx CML  Thrombocytopenia P:  DVT px: SQ heparin Monitor CBC intermittently Transfuse per usual ICU guidelines holding home nilotinib for now  INFECTIOUS PNA vs purulent bronchitis P:   BCx2 12/20>>> UC 12/20>>> Vancomycin 12/20 >> 12/21 Cefepime 12/20 >>   ENDOCRINE DM 2 P:   Cont SSI   NEUROLOGIC AMS, hypercarbic - resolved P:   RASS goal: -1,-2 PAD protocol  CCM X 35 mins  Merton Border, MD ; [facility] [phone].  After 5:30 PM or weekends, call [phone]

## 2014-01-01 NOTE — Progress Notes (Signed)
OT Cancellation Note  Patient Details Name: Dale Taylor MRN: [id number] DOB: 26-Feb-1940   Cancelled Treatment:    Reason Eval/Treat Not Completed: Patient not medically ready. Pt had to go back on Bipap earlier this AM, RN reports that he needs to rest. Will re-check on pt tomorrow.  Almon Register [phone] 01/01/2014, 9:09 AM

## 2014-01-02 ENCOUNTER — Inpatient Hospital Stay (HOSPITAL_COMMUNITY): Payer: Medicare HMO

## 2014-01-02 ENCOUNTER — Encounter (HOSPITAL_COMMUNITY): Payer: Self-pay

## 2014-01-02 LAB — COMPREHENSIVE METABOLIC PANEL
ALK PHOS: 76 U/L (ref 39–117)
ALT: 28 U/L (ref 0–53)
AST: 22 U/L (ref 0–37)
Albumin: 2.4 g/dL — ABNORMAL LOW (ref 3.5–5.2)
Anion gap: 11 (ref 5–15)
BUN: 45 mg/dL — ABNORMAL HIGH (ref 6–23)
CO2: 25 mmol/L (ref 19–32)
Calcium: 8 mg/dL — ABNORMAL LOW (ref 8.4–10.5)
Chloride: 105 mEq/L (ref 96–112)
Creatinine, Ser: 2.09 mg/dL — ABNORMAL HIGH (ref 0.50–1.35)
GFR, EST AFRICAN AMERICAN: 34 mL/min — AB (ref >90)
GFR, EST NON AFRICAN AMERICAN: 30 mL/min — AB (ref >90)
Glucose, Bld: 108 mg/dL — ABNORMAL HIGH (ref 70–99)
POTASSIUM: 3.1 mmol/L — AB (ref 3.5–5.1)
SODIUM: 141 mmol/L (ref 135–145)
Total Bilirubin: 0.5 mg/dL (ref 0.3–1.2)
Total Protein: 4.8 g/dL — ABNORMAL LOW (ref 6.0–8.3)

## 2014-01-02 LAB — CBC
HCT: 27.8 % — ABNORMAL LOW (ref 39.0–52.0)
Hemoglobin: 8.8 g/dL — ABNORMAL LOW (ref 13.0–17.0)
MCH: 28.1 pg (ref 26.0–34.0)
MCHC: 31.7 g/dL (ref 30.0–36.0)
MCV: 88.8 fL (ref 78.0–100.0)
PLATELETS: 140 10*3/uL — AB (ref 150–400)
RBC: 3.13 MIL/uL — AB (ref 4.22–5.81)
RDW: 16.5 % — ABNORMAL HIGH (ref 11.5–15.5)
WBC: 5.5 10*3/uL (ref 4.0–10.5)

## 2014-01-02 LAB — GLUCOSE, CAPILLARY
GLUCOSE-CAPILLARY: 103 mg/dL — AB (ref 70–99)
Glucose-Capillary: 103 mg/dL — ABNORMAL HIGH (ref 70–99)
Glucose-Capillary: 104 mg/dL — ABNORMAL HIGH (ref 70–99)
Glucose-Capillary: 104 mg/dL — ABNORMAL HIGH (ref 70–99)
Glucose-Capillary: 138 mg/dL — ABNORMAL HIGH (ref 70–99)
Glucose-Capillary: 88 mg/dL (ref 70–99)

## 2014-01-02 MED ORDER — VITAL HIGH PROTEIN PO LIQD
1000.0000 mL | ORAL | Status: DC
  Administered 2014-01-02: 1000 mL
  Filled 2014-01-02 (×5): qty 1000

## 2014-01-02 MED ORDER — FREE WATER
200.0000 mL | Freq: Three times a day (TID) | Status: DC
  Administered 2014-01-02 – 2014-01-03 (×3): 200 mL

## 2014-01-02 MED ORDER — AMIODARONE HCL IN DEXTROSE 360-4.14 MG/200ML-% IV SOLN
30.0000 mg/h | INTRAVENOUS | Status: DC
  Administered 2014-01-02: 30 mg/h via INTRAVENOUS
  Filled 2014-01-02 (×3): qty 200

## 2014-01-02 MED ORDER — AMIODARONE HCL 200 MG PO TABS
400.0000 mg | ORAL_TABLET | Freq: Two times a day (BID) | ORAL | Status: DC
  Administered 2014-01-02 – 2014-01-03 (×3): 400 mg
  Filled 2014-01-02 (×4): qty 2

## 2014-01-02 MED ORDER — PRO-STAT SUGAR FREE PO LIQD
30.0000 mL | Freq: Two times a day (BID) | ORAL | Status: AC
  Administered 2014-01-02 (×2): 30 mL
  Filled 2014-01-02 (×2): qty 30

## 2014-01-02 MED ORDER — POTASSIUM CHLORIDE 20 MEQ/15ML (10%) PO SOLN
40.0000 meq | Freq: Once | ORAL | Status: AC
  Administered 2014-01-02: 40 meq
  Filled 2014-01-02: qty 30

## 2014-01-02 MED ORDER — VITAL HIGH PROTEIN PO LIQD
1000.0000 mL | ORAL | Status: DC
  Filled 2014-01-02 (×2): qty 1000

## 2014-01-02 NOTE — Progress Notes (Signed)
[place] Progress Note Patient Name: Marjorie Lussier DOB: 09-04-1940 MRN: [id number]   Date of Service  01/02/2014  HPI/Events of Note    eICU Interventions  Hypokalemia -repleted      Intervention Category Intermediate Interventions: Electrolyte abnormality - evaluation and management  ALVA,RAKESH V. 01/02/2014, 6:09 AM

## 2014-01-02 NOTE — Progress Notes (Signed)
Transported pt to CT with no complications.

## 2014-01-02 NOTE — Clinical Social Work Psychosocial (Signed)
Clinical Social Work Department BRIEF PSYCHOSOCIAL ASSESSMENT 01/02/2014  Patient:  Dale Taylor, Dale Taylor     Account Number:  [id number]     [place] date:  12/29/2013  Clinical Social Worker:  Glendon Axe, CLINICAL SOCIAL WORKER  Date/Time:  01/01/2014 10:39 AM  Referred by:  Physician  Date Referred:  01/01/2014 Referred for  SNF Placement   Other Referral:   Interview type:  Other - See comment Other interview type:   CSW spoke with pt's wife, Dale Taylor and daughter, Dale Taylor via telephone.    PSYCHOSOCIAL DATA Living Status:  FACILITY Admitted from facility:   Level of care:   Primary support name:  Dale Taylor Primary support relationship to patient:  SPOUSE Degree of support available:   STRONG    CURRENT CONCERNS Current Concerns  Post-Acute Placement   Other Concerns:    SOCIAL WORK ASSESSMENT / PLAN Clinical Social Worker spoke with pt's wife and daughter, Dale Taylor in reference to post-acute placement for SNF. Pt's wife reported pt is a resident at [facility] in [place] and confirmed pt's return once medically stable. Pt's wife also stated she is pleased with the care at [facility]. CSW has submitted pt's clinicals to facility for review and facility will not be able to accept pt on vent. Pt was vented yesterday. CSW will keep facility updated. CSW will continue to follow pt and pt's family to provide continued support and facilitate pt's dc needs once medically stable.   Assessment/plan status:  Psychosocial Support/Ongoing Assessment of Needs Other assessment/ plan:   Information/referral to community resources:   n/a.    PATIENT'S/FAMILY'S RESPONSE TO PLAN OF CARE: Pt being prepared for a procedure. Pt's family pleasant and appreciated social work intervention.     Glendon Axe, MSW, LCSWA [phone] 01/02/2014 10:51 AM

## 2014-01-02 NOTE — Progress Notes (Signed)
OT Cancellation Note  Patient Details Name: Dale Taylor MRN: [id number] DOB: [birth year]   Cancelled Treatment:    Reason Eval/Treat Not Completed: Patient not medically ready. Pt intubated since OT ordered.  Please reorder as appropriate.  Malka So 01/02/2014, 11:33 AM  [phone]

## 2014-01-02 NOTE — Progress Notes (Signed)
PULMONARY / CRITICAL CARE MEDICINE   Name: Dale Taylor MRN: [id number] DOB: 1940-09-19    ADMISSION DATE:  01/01/2014 CONSULTATION DATE:  12/20  REFERRING MD :  FPTS  CHIEF COMPLAINT:  Respiratory failure    INITIAL PRESENTATION: 73yo male with extensive PMH including hx CML, prostate ca, HTN, chronic dCHF, chronic suprapubic catheter, DM and CKD presented 12/20 from SNF with concern for "kidney problems". Admitted by [facility] teaching service with acute on chronic renal failure with hyperkalemia, HCAP +/- worsening R effusion and UTI.  PCCM consulted r/t respiratory acidosis and increasing somnolence.    STUDIES:  12/21 TTE: The cavity size was mildly dilated. Wall thickness was normal. Systolic function was mildly to moderately reduced. The estimated ejection fraction was in the range of 40% to 45%. There is severe hypokinesis of the mid-apicalanteroseptal and apical myocardium. Doppler parameters are consistent with abnormal left ventricular relaxation (grade 1 diastolicdysfunction). 12/21 Renal US: negative 12/22 CT chest: extensive pleural thickening on R. R lung with reduced volume. Bilateral R>L AS dz  SIGNIFICANT EVENTS: 12/21 PAF with RVR. Amiodarone started. Converted to NSR. Cards consult obtained. Recommended continuing current rx. No anticoagulation given high risk 12/22 Increased dyspnea. White out of R hemithorax on CXR. Intubated. Copious purulent secretions from ETT.  12/23 Copious purulent ET secretion. R lung better aerated. Cr improved  SUBJECTIVE:  As above  VITAL SIGNS: Temp:  [97.5 F (36.4 C)-98.6 F (37 C)] 98.4 F (36.9 C) (12/23 1146) Pulse Rate:  [59-82] 70 (12/23 1400) Resp:  [0-26] 15 (12/23 1400) BP: (94-134)/(40-58) 130/52 mmHg (12/23 1400) SpO2:  [94 %-100 %] 99 % (12/23 1400) FiO2 (%):  [40 %-50 %] 40 % (12/23 1400) Weight:  [101.5 kg (223 lb 12.3 oz)] 101.5 kg (223 lb 12.3 oz) (12/23 0500) HEMODYNAMICS:   VENTILATOR SETTINGS: Vent Mode:  [-]  PRVC FiO2 (%):  [40 %-50 %] 40 % Set Rate:  [15 bmp] 15 bmp Vt Set:  [600 mL] 600 mL PEEP:  [5 cmH20] 5 cmH20 Pressure Support:  [5 cmH20] 5 cmH20 Plateau Pressure:  [22 cmH20-25 cmH20] 25 cmH20 INTAKE / OUTPUT:  Intake/Output Summary (Last 24 hours) at 01/02/14 1508 Last data filed at 01/02/14 1432  Gross per 24 hour  Intake 1453.74 ml  Output   2050 ml  Net -596.26 ml    PHYSICAL EXAMINATION: General:  Sedated intubated Neuro:  RASS -2, no focal deficits HEENT: WNL Cardiovascular: reg, no M Lungs: improved BS on R Abdomen:  Obese, soft, +bs, suprapubic catheter intact  Ext:  Warm and dry, 2+ generalized pitting edema   LABS: I have reviewed all of today's lab results. Relevant abnormalities are discussed in the A/P section  CXR: improved R lung aeration  ASSESSMENT / PLAN:  PULMONARY Acute on chronic hypercarbic respiratory failure Chronic R pleural thickening of unclear etiology Doubt pulm edema R lung collapse, resolved with intubation Suspect PNA vs purulent tracheobronchitis P:   Cont full vent support - settings reviewed and/or adjusted Cont vent bundle Daily SBT if/when meets criteria  CARDIOVASCULAR CHF - diastolic PAF with RVR > NSR HTN  CAD - cardiac markers negative P:  Change amiodarone to enteral Cont home dose of metoprolol Not considered candidate for chronic anticoagulation  RENAL Acute on chronic renal failure, improving Hyperkalemia, resolved hypokalemia P:   Monitor BMET intermittently Monitor I/Os Correct electrolytes as indicated  GASTROINTESTINAL obesity  P:   Cont PPI Cont TFs  HEMATOLOGIC Chronic anemia  Hx CML  Thrombocytopenia, improving P:  DVT px: SQ heparin Monitor CBC intermittently Transfuse per usual ICU guidelines holding home nilotinib for now   INFECTIOUS PNA vs purulent tracheobronchitis P:   BCx2 12/20>>>NEG UC 12/20>>>NEG Resp 12/22 >>   Vancomycin 12/20 >> 12/21 Cefepime 12/20 >>    ENDOCRINE DM 2, controlled P:   Cont SSI   NEUROLOGIC AMS, hypercarbic - resolved P:   RASS goal: -1,-2 Cont PAD protocol  CCM X 35 mins  Merton Border, MD ; [facility] service Mobile [phone].  After 5:30 PM or weekends, call [phone]

## 2014-01-02 NOTE — Progress Notes (Signed)
UR Completed.  [phone]  

## 2014-01-02 NOTE — Progress Notes (Signed)
PT Cancellation Note  Patient Details Name: Dale Taylor MRN: [id number] DOB: 12-23-1940   Cancelled Treatment:    Reason Eval/Treat Not Completed: Medical issues which prohibited therapy Pt intubated since previous therapy session with newly elevated troponin. Will hold until downward trend of troponin and improvement in patient medical status. Will continue to follow and monitor for progress.   [place], [place]   Ellouise Newer 01/02/2014, 11:37 AM

## 2014-01-02 NOTE — Progress Notes (Signed)
INITIAL NUTRITION ASSESSMENT  DOCUMENTATION CODES Per approved criteria  -Obesity Unspecified   INTERVENTION:  Initiate TF via OGT with Vital High Protein at 25 ml/h and Prostat 30 ml BID on day 1; on day 2, d/c Prostat and increase to goal rate of 60 ml/h (1440 ml per day) to provide 1440 kcals, 126 gm protein, 1204 ml free water daily.  Above TF regimen plus current calories from Propofol provides a total of 1934 kcals (25 kcals/kg ideal weight).  NUTRITION DIAGNOSIS: Inadequate oral intake related to inability to eat as evidenced by NPO status.   Goal: Enteral nutrition to provide 60-70% of estimated calorie needs (22-25 kcals/kg ideal body weight) and 100% of estimated protein needs, based on ASPEN guidelines for hypocaloric, high protein feeding in critically ill obese individuals  Monitor:  TF tolerance/adequacy, weight trend, labs, vent status.  Reason for Assessment: MD Consult for TF initiation and management.  73 y.o. male  Admitting Dx: HCAP (healthcare-associated pneumonia)  ASSESSMENT: 73yo male with extensive PMH including hx CML, prostate ca, HTN, chronic dCHF, chronic suprapubic catheter, DM and CKD presented 12/20 from SNF with concern for "kidney problems". Admitted by [facility] teaching service with acute on chronic renal failure with hyperkalemia, HCAP +/- worsening R effusion and UTI. PCCM consulted r/t respiratory acidosis and increasing somnolence.   Discussed patient in ICU rounds today. Plans to start TF today as patient will need intubation for a few more days. Nutrition focused physical exam completed.  No muscle or subcutaneous fat depletion noticed.  Patient is currently intubated on ventilator support MV: 10 L/min Temp (24hrs), Avg:98.2 F (36.8 C), Min:97.5 F (36.4 C), Max:98.6 F (37 C)  Propofol: 18.7 ml/hr providing 494 kcals/day  Height: Ht Readings from Last 1 Encounters:  12/31/2013 5\' 11"  (1.803 m)    Weight: Wt Readings from Last 1  Encounters:  01/02/14 223 lb 12.3 oz (101.5 kg)    Ideal Body Weight: 78.2 kg  % Ideal Body Weight: 130%  Wt Readings from Last 10 Encounters:  01/02/14 223 lb 12.3 oz (101.5 kg)  07/07/12 166 lb 4.8 oz (75.433 kg)  08/10/11 225 lb 11.2 oz (102.377 kg)  05/14/11 226 lb (102.513 kg)  05/11/11 226 lb (102.513 kg)  02/25/11 220 lb 12.8 oz (100.154 kg)  11/19/10 229 lb 14.4 oz (104.282 kg)    Usual Body Weight: unknown  % Usual Body Weight: N/A  BMI:  Body mass index is 31.22 kg/(m^2).  Estimated Nutritional Needs: Kcal: 1970 Protein: 115-125 gm Fluid: 2 L  Skin: intact  Diet Order: Diet NPO time specified  EDUCATION NEEDS: -Education not appropriate at this time   Intake/Output Summary (Last 24 hours) at 01/02/14 1156 Last data filed at 01/02/14 1000  Gross per 24 hour  Intake 1262.36 ml  Output   2125 ml  Net -862.64 ml    Last BM: 12/21   Labs:   Recent Labs Lab 12/15/2013 1942 12/31/13 0140 01/01/14 0553 01/02/14 0500  NA 138 141 142 141  K 5.7* 4.9 3.9 3.1*  CL 101 104 107 105  CO2 24 25 24 25   BUN 53* 52* 50* 45*  CREATININE 3.16* 3.06* 2.47* 2.09*  CALCIUM 8.4 8.8 8.5 8.0*  MG 2.3  --   --   --   PHOS 4.5  --   --   --   GLUCOSE 167* 159* 110* 108*    CBG (last 3)   Recent Labs  01/02/14 0346 01/02/14 0819 01/02/14 [place]  104* 103* 103*    Scheduled Meds: . albuterol  2.5 mg Nebulization Q6H  . antiseptic oral rinse  7 mL Mouth Rinse QID  . aspirin  81 mg Per Tube Daily  . ceFEPime (MAXIPIME) IV  1 g Intravenous Q24H  . chlorhexidine  15 mL Mouth Rinse BID  . clopidogrel  75 mg Per Tube Daily  . heparin  5,000 Units Subcutaneous 3 times per day  . insulin aspart  0-15 Units Subcutaneous 6 times per day  . metoprolol tartrate  37.5 mg Per Tube BID  . pantoprazole (PROTONIX) IV  40 mg Intravenous Q24H  . senna-docusate  2 tablet Oral BID    Continuous Infusions: . amiodarone 30 mg/hr (01/02/14 0030)  . propofol 30  mcg/kg/min (01/02/14 0017)    Past Medical History  Diagnosis Date  . Hypertension   . Hypercholesterolemia   . GERD (gastroesophageal reflux disease)   . Dyspnea on exertion   . Claudication in peripheral vascular disease   . PVD (peripheral vascular disease)   . S/P renal artery angioplasty 2009--- W/ STENT X1  TO LEFT COMMON ILIAC    . ED (erectile dysfunction)   . History of BPH   . Frequency of urination   . Urgency of urination   . Nocturia   . Chronic gouty arthritis FEET  . Diabetes mellitus     type II-- ORAL MED  . Prostate cancer SCHEDULED FOR RADIOACTIVE SEED IMPLANTS OF PROSTATE  05-18-2011    FOLLOWED BY DR [facility] AND DR Valere Dross  . CML (chronic myelocytic leukemia) FOLLOWED BY DR LEWIS ([place])    TAKES [facility] PO MED FOR TX--  PT STATES STABLE  NO SYMPTOMS  . Iron deficiency anemia   . Chronic renal insufficiency   . PAD (peripheral artery disease)   . Cardiovascular disease with arteriosclerosis   . Blood transfusion LAST TIME NOV 2012  --  X2  UNITS  (FOR ANEMIA AND THROMBOCYTOPENIA  . Leukocytosis   . Peripheral edema   . History of thrombocytopenia   . Heart murmur   . CHF (congestive heart failure) ADMISSION NOV 2012  [facility])  REQUESTED RECORDS    DIATOSLIC CHF WITH LEFT VENTRICULAR IMPAIRED RELAXATION-----   CARDIOLOGIST----  DR Dallie Piles  . Hypertension   . Arthritis   . History of angina   . Acute renal failure Admitted to [facility] 06/06/12 with hip fracture; creatinine 4.3, K 7.8. Aldactone and lisinopril stopped    Past Surgical History  Procedure Laterality Date  . Lung lobectomy  AGE 7 (APPROX.)    RIGHT LOWER LOBECTOMY 20 YEARS AGO, NOT CANCER (PROBLEM HEMORRHAGE)  . Hemorrhoid surgery    . Transthoracic echocardiogram  12-05-2010  [facility])    LEFT VENTRICULAR HYPERTROPHY / PRESERVED SYSTOLIC FUNCTION/ EF 49-44%/  IMPAIRED LEFT VENTRICULAR RELAXATION WITH LEFT ATRIAL ENLARGEMENT/ MILD MITRAL AND TRICUSPID  REGURG.  . Cataract extraction w/ intraocular lens  implant, bilateral  2012  . Aortogram/ angioplasty and stenting of left common iliac artery  04-07-2007   DR FIELDS    LEFT COMMON ILIAC STENOSIS 80%, STENTED/ BILATERAL OCCLUSION ANTERIOR TIBIAL ARTERY/ BILATERAL INCOMPLETE PLANTAR ARCH FILLED BY PERONEAL AND POSTERIOR TIBIAL ARTERY/ DIFFUSE BUT PATENT SUPERFICIIAL FEMORAL ARTERIES BILATERLLY  . Cardiovascular stress test  05-19-2011  DR ROBERT KRASOWSKI    NO EVIDENCE OF ISCHEMIA/ NORMAL LVSF/ EF 55%  . Radioactive seed implant  07/21/2011    Procedure: RADIOACTIVE SEED IMPLANT;  Surgeon: Joie Bimler, MD;  Location: [place];  Service: Urology;  Laterality: N/A;    72   seeds implanted  . Cystoscopy  07/21/2011    Procedure: CYSTOSCOPY;  Surgeon: Joie Bimler, MD;  Location: [facility];  Service: Urology;  Laterality: N/A;  no seeds found in bladder     Molli Barrows, RD, LDN, [place] Pager [phone] After Hours Pager [phone]

## 2014-01-03 ENCOUNTER — Inpatient Hospital Stay (HOSPITAL_COMMUNITY): Payer: Medicare HMO

## 2014-01-03 LAB — GLUCOSE, CAPILLARY
GLUCOSE-CAPILLARY: 73 mg/dL (ref 70–99)
GLUCOSE-CAPILLARY: 114 mg/dL — AB (ref 70–99)
GLUCOSE-CAPILLARY: 115 mg/dL — AB (ref 70–99)
GLUCOSE-CAPILLARY: 116 mg/dL — AB (ref 70–99)
Glucose-Capillary: 123 mg/dL — ABNORMAL HIGH (ref 70–99)
Glucose-Capillary: 111 mg/dL — ABNORMAL HIGH (ref 70–99)

## 2014-01-03 LAB — CBC
HEMATOCRIT: 25.5 % — AB (ref 39.0–52.0)
HEMOGLOBIN: 8.2 g/dL — AB (ref 13.0–17.0)
MCH: 28.1 pg (ref 26.0–34.0)
MCHC: 32.2 g/dL (ref 30.0–36.0)
MCV: 87.3 fL (ref 78.0–100.0)
Platelets: 169 10*3/uL (ref 150–400)
RBC: 2.92 MIL/uL — ABNORMAL LOW (ref 4.22–5.81)
RDW: 16.4 % — ABNORMAL HIGH (ref 11.5–15.5)
WBC: 4.7 10*3/uL (ref 4.0–10.5)

## 2014-01-03 LAB — BASIC METABOLIC PANEL
Anion gap: 7 (ref 5–15)
BUN: 42 mg/dL — AB (ref 6–23)
CO2: 26 mmol/L (ref 19–32)
Calcium: 8.1 mg/dL — ABNORMAL LOW (ref 8.4–10.5)
Chloride: 110 mEq/L (ref 96–112)
Creatinine, Ser: 1.72 mg/dL — ABNORMAL HIGH (ref 0.50–1.35)
GFR calc Af Amer: 44 mL/min — ABNORMAL LOW (ref >90)
GFR calc non Af Amer: 38 mL/min — ABNORMAL LOW (ref >90)
GLUCOSE: 86 mg/dL (ref 70–99)
POTASSIUM: 3.5 mmol/L (ref 3.5–5.1)
Sodium: 143 mmol/L (ref 135–145)

## 2014-01-03 LAB — CULTURE, RESPIRATORY W GRAM STAIN

## 2014-01-03 LAB — CULTURE, RESPIRATORY

## 2014-01-03 LAB — PROCALCITONIN: PROCALCITONIN: 0.26 ng/mL

## 2014-01-03 MED ORDER — PANTOPRAZOLE SODIUM 40 MG PO TBEC
40.0000 mg | DELAYED_RELEASE_TABLET | Freq: Every day | ORAL | Status: DC
  Administered 2014-01-05 – 2014-01-06 (×2): 40 mg via ORAL
  Filled 2014-01-03 (×2): qty 1

## 2014-01-03 MED ORDER — METOPROLOL TARTRATE 25 MG PO TABS
37.5000 mg | ORAL_TABLET | Freq: Two times a day (BID) | ORAL | Status: DC
  Administered 2014-01-03 – 2014-01-06 (×6): 37.5 mg via ORAL
  Filled 2014-01-03 (×7): qty 1

## 2014-01-03 MED ORDER — INSULIN ASPART 100 UNIT/ML ~~LOC~~ SOLN
0.0000 [IU] | Freq: Three times a day (TID) | SUBCUTANEOUS | Status: DC
  Administered 2014-01-09 (×2): 2 [IU] via SUBCUTANEOUS

## 2014-01-03 MED ORDER — FUROSEMIDE 10 MG/ML IJ SOLN
40.0000 mg | Freq: Once | INTRAMUSCULAR | Status: AC
  Administered 2014-01-03: 40 mg via INTRAVENOUS
  Filled 2014-01-03: qty 4

## 2014-01-03 MED ORDER — ACETAMINOPHEN 325 MG PO TABS
650.0000 mg | ORAL_TABLET | Freq: Four times a day (QID) | ORAL | Status: DC | PRN
  Administered 2014-01-04 – 2014-01-16 (×6): 650 mg via ORAL
  Filled 2014-01-03 (×7): qty 2

## 2014-01-03 MED ORDER — ASPIRIN 81 MG PO CHEW
81.0000 mg | CHEWABLE_TABLET | Freq: Every day | ORAL | Status: DC
  Administered 2014-01-04 – 2014-01-06 (×3): 81 mg via ORAL
  Filled 2014-01-03 (×3): qty 1

## 2014-01-03 MED ORDER — AMIODARONE HCL 200 MG PO TABS
400.0000 mg | ORAL_TABLET | Freq: Two times a day (BID) | ORAL | Status: DC
  Administered 2014-01-03 – 2014-01-06 (×6): 400 mg via ORAL
  Filled 2014-01-03 (×7): qty 2

## 2014-01-03 MED ORDER — CLOPIDOGREL BISULFATE 75 MG PO TABS
75.0000 mg | ORAL_TABLET | Freq: Every day | ORAL | Status: DC
  Administered 2014-01-04 – 2014-01-06 (×3): 75 mg via ORAL
  Filled 2014-01-03 (×3): qty 1

## 2014-01-03 MED ORDER — POTASSIUM CHLORIDE 20 MEQ/15ML (10%) PO SOLN
20.0000 meq | Freq: Once | ORAL | Status: AC
  Administered 2014-01-03: 20 meq
  Filled 2014-01-03: qty 15

## 2014-01-03 MED ORDER — CETYLPYRIDINIUM CHLORIDE 0.05 % MT LIQD
7.0000 mL | Freq: Two times a day (BID) | OROMUCOSAL | Status: DC
  Administered 2014-01-04 – 2014-01-05 (×3): 7 mL via OROMUCOSAL

## 2014-01-03 MED ORDER — ISOSORBIDE MONONITRATE ER 30 MG PO TB24
30.0000 mg | ORAL_TABLET | Freq: Every morning | ORAL | Status: DC
  Administered 2014-01-03 – 2014-01-06 (×4): 30 mg via ORAL
  Filled 2014-01-03 (×4): qty 1

## 2014-01-03 MED ORDER — ATORVASTATIN CALCIUM 20 MG PO TABS
20.0000 mg | ORAL_TABLET | Freq: Every day | ORAL | Status: DC
  Administered 2014-01-03 – 2014-01-06 (×4): 20 mg via ORAL
  Filled 2014-01-03 (×4): qty 1

## 2014-01-03 NOTE — Progress Notes (Signed)
Physical Therapy Treatment Patient Details Name: Dale Taylor MRN: [id number] DOB: 12/29/1940 Today's Date: 01/03/2014    History of Present Illness Dale Taylor is a 73 y.o. male presenting with acute renal failure according to patient's nursing facility. PMH is significant for hypertension, Type 2 diabetes, chronic diastolic heart failure, hyperkalemia, HLD, prostate cancer, iron deficiency anemia, A. fib with RVR. Intubated 12/22, Extubated 12/24.    PT Comments    Patient is slowly progressing towards physical therapy goals. Required mod assist +2 for transfer transfer training and pivot to chair from bed today. Limited by fatigue but eager to work with therapy and tolerated exercises well. SpO2 maintained 100% on 5L supplemental O2 throughout therapy session with HR between 75-100 bpm. Patient will continue to benefit from skilled physical therapy services to further improve independence with functional mobility.   Follow Up Recommendations  SNF;Supervision/Assistance - 24 hour     Equipment Recommendations  None recommended by PT    Recommendations for Other Services       Precautions / Restrictions Precautions Precautions: Fall Restrictions Weight Bearing Restrictions: No    Mobility  Bed Mobility Overal bed mobility: Needs Assistance;+2 for physical assistance Bed Mobility: Supine to Sit     Supine to sit: Min assist;+2 for physical assistance;HOB elevated     General bed mobility comments: HOB 30 degrees with min assist to pivot to EOB and scoot fully to EOB. Second person assist with balance initially but progressed to sitting with 1 UE supported at EOB.  Transfers Overall transfer level: Needs assistance Equipment used: Rolling walker (2 wheeled) Transfers: Sit to/from [facility] Sit to Stand: +2 physical assistance;From elevated surface;Mod assist Stand pivot transfers: +2 physical assistance;Mod assist       General transfer comment: +2  physical assist for boost to stand from elevated bed surface with mod assist for walker control with pivot to chair. Pt needed additional assist while turning to ensure buttocks was over chair prior to sitting as he attempted to sit prematurely due to fatigue. VC for sequencing throughout. Mod assist +2 for boost to stand from reclining chair with VC for hand placement.  Ambulation/Gait                 Stairs            Wheelchair Mobility    Modified Rankin (Stroke Patients Only)       Balance                                    Cognition Arousal/Alertness: Awake/alert Behavior During Therapy: WFL for tasks assessed/performed Overall Cognitive Status: Impaired/Different from baseline Area of Impairment: Following commands       Following Commands: Follows one step commands consistently;Follows one step commands with increased time            Exercises General Exercises - Lower Extremity Ankle Circles/Pumps: AROM;Both;15 reps;Seated Quad Sets: Strengthening;Both;10 reps;Seated Gluteal Sets: Strengthening;Both;10 reps;Seated Hip Flexion/Marching: Strengthening;Both;10 reps;Seated    General Comments General comments (skin integrity, edema, etc.): BIL LE edema, significantly so around medial ankles.      Pertinent Vitals/Pain Pain Assessment: No/denies pain  BP 145/60    Home Living                      Prior Function            PT Goals (current goals can  now be found in the care plan section) Acute Rehab PT Goals PT Goal Formulation: With patient Time For Goal Achievement: 01/14/14 Potential to Achieve Goals: Fair Progress towards PT goals: Progressing toward goals    Frequency  Min 2X/week    PT Plan Current plan remains appropriate    Co-evaluation             End of Session Equipment Utilized During Treatment: Oxygen Activity Tolerance: Patient limited by fatigue Patient left: in chair;with call  bell/phone within reach;with nursing/sitter in room     Time: 6546-5035 PT Time Calculation (min) (ACUTE ONLY): 20 min  Charges:  $Therapeutic Activity: 8-22 mins                    G Codes:      Ellouise Newer 01-06-14, 4:30 PM Camille Bal [place], [place]

## 2014-01-03 NOTE — Procedures (Signed)
Extubation Procedure Note  Patient Details:   Name: Dale Taylor DOB: April 08, 1940 MRN: [id number]   Airway Documentation:     Evaluation  O2 sats: stable throughout Complications: No apparent complications Patient did tolerate procedure well. Bilateral Breath Sounds: Rhonchi, Diminished Suctioning: Airway Yes    Patient extubated to [place]. Per MD order. No complications. Vital signs stable at this time. Patient tolerated well. RT will continue to monitor.   Mcneil Sober 01/03/2014, 11:43 AM

## 2014-01-03 NOTE — Progress Notes (Signed)
PULMONARY / CRITICAL CARE MEDICINE   Name: Dale Taylor MRN: [id number] DOB: 1940/09/26    ADMISSION DATE:  12/14/2013 CONSULTATION DATE:  12/20  REFERRING MD :  FPTS  CHIEF COMPLAINT:  Respiratory failure    INITIAL PRESENTATION:  73 yo male admitted with A on C renal failure, hyperkalemia, HCAP, UTI.  PCCM assumed care in ICU.  STUDIES:  12/21 TTE >> EF 40 to 73%, grade 1 diastolic dysfx 22/02 Renal US >> negative 12/22 CT chest >> extensive pleural thickening on R. R lung with reduced volume. Bilateral R>L AS dz  SIGNIFICANT EVENTS: 12/21 PAF with RVR. Amiodarone started. Converted to NSR. Cards consult obtained. Recommended continuing current rx. No anticoagulation given high risk 12/22 Increased dyspnea. White out of R hemithorax on CXR. Intubated. Copious purulent secretions from ETT.  12/23 Copious purulent ET secretion. R lung better aerated. Cr improved 12/24 extubated  SUBJECTIVE:  Tolerating SBT  VITAL SIGNS: Temp:  [98.4 F (36.9 C)-100.3 F (37.9 C)] 98.5 F (36.9 C) (12/24 0700) Pulse Rate:  [59-80] 80 (12/24 1053) Resp:  [6-17] 15 (12/24 0818) BP: (112-141)/(44-60) 136/58 mmHg (12/24 1053) SpO2:  [98 %-100 %] 100 % (12/24 0818) FiO2 (%):  [40 %] 40 % (12/24 0819) Weight:  [218 lb 0.6 oz (98.9 kg)] 218 lb 0.6 oz (98.9 kg) (12/24 0400) VENTILATOR SETTINGS: Vent Mode:  [-] CPAP;PSV FiO2 (%):  [40 %] 40 % Set Rate:  [15 bmp] 15 bmp Vt Set:  [600 mL] 600 mL PEEP:  [5 cmH20] 5 cmH20 Pressure Support:  [5 cmH20] 5 cmH20 Plateau Pressure:  [12 cmH20-25 cmH20] 12 cmH20 INTAKE / OUTPUT:  Intake/Output Summary (Last 24 hours) at 01/03/14 1117 Last data filed at 01/03/14 0600  Gross per 24 hour  Intake 1715.5 ml  Output   1330 ml  Net  385.5 ml    PHYSICAL EXAMINATION: General: no distress Neuro: RASS 0 HEENT: ETT in place Cardiovascular: regular Lungs: scattered rhonchi Abdomen:  Obese, soft, +bs, suprapubic catheter intact  Ext:  1+ edema    LABS: CBC Recent Labs     01/01/14  0553  01/02/14  0500  01/03/14  0500  WBC  7.6  5.5  4.7  HGB  9.4*  8.8*  8.2*  HCT  30.8*  27.8*  25.5*  PLT  135*  140*  169   BMET Recent Labs     01/01/14  0553  01/02/14  0500  01/03/14  0500  NA  142  141  143  K  3.9  3.1*  3.5  CL  107  105  110  CO2  24  25  26   BUN  50*  45*  42*  CREATININE  2.47*  2.09*  1.72*  GLUCOSE  110*  108*  86    Electrolytes Recent Labs     01/01/14  0553  01/02/14  0500  01/03/14  0500  CALCIUM  8.5  8.0*  8.1*    Sepsis Markers Recent Labs     01/01/14  0553  01/03/14  0500  PROCALCITON  0.21  0.26   Liver Enzymes Recent Labs     01/01/14  0553  01/02/14  0500  AST  29  22  ALT  36  28  ALKPHOS  95  76  BILITOT  0.5  0.5  ALBUMIN  3.0*  2.4*    Cardiac Enzymes Recent Labs     01/01/14  0553  TROPONINI  1.42*  Glucose Recent Labs     01/02/14  1125  01/02/14  1524  01/02/14  1917  01/03/14  0029  01/03/14  0403  01/03/14  0820  GLUCAP  103*  138*  88  123*  73  114*    Imaging Ct Chest Wo Contrast  01/02/2014   CLINICAL DATA:  Left pleural effusion  EXAM: CT CHEST WITHOUT CONTRAST  TECHNIQUE: Multidetector CT imaging of the chest was performed following the standard protocol without IV contrast material administration.  COMPARISON:  Chest radiograph January 01, 2014 and chest CT December 01, 2013  FINDINGS: Endotracheal tube tip is in the distal trachea.  No pneumothorax.  There is extensive volume loss on the right which in part is due to postoperative change. Compared to the previous CT, however, there has been interval re-expansion of much of the right upper lobe. There are pleural effusions bilaterally, slightly larger on the left than on the right. Effusions are for the most part free-flowing, although there is some loculation on the right inferiorly. There remains consolidation with air bronchograms in in portions of the right middle and lower lobes.  There is patchy airspace consolidation throughout much of the left lower lobe and inferior lingula, increased from prior CT and not felt to be changed compared to 1 day prior. There is also focal consolidation in a portion of the posterior segment of the left upper lobe.  Compared to the previous CT, the cavitary lesion in the left lower lobe is present but difficult to discern due to overlying infiltrate. This cavitary lesion measures 2.8 x 2 cm. The previously noted lentiform shaped lesion along the left pleura at the left mid hemithorax level measures 4.1 x 2.6 cm, not significantly changed.  Central catheter tip is in the superior vena cava. There are multiple small mediastinal lymph nodes but no frank adenopathy by size criteria. Pericardium is not thickened. There are multiple foci of coronary artery calcification.  Nasogastric tube extends into the stomach.  In the visualized upper abdomen, there is either sludge or small gallstones in the gallbladder. There is atherosclerotic change in the aorta and right renal artery.  There is degenerative change in the thoracic spine. There are no blastic or lytic bone lesions. Thyroid appears unremarkable. Gynecomastia again noted.  IMPRESSION: Postoperative change on the right with extensive volume loss. There is consolidation in some of the remaining right lung, although much of the remaining right upper lobe has re-expanded compared to 1 month prior. On the left, there is a moderate free-flowing effusion with fairly extensive consolidation throughout much of the left lower lobe and portions of the inferior lingula. There is also some patchy airspace disease in the posterior segment of the left upper lobe.  The cavitary lesion in the left lower lobe is again noted and not felt to be significantly changed. It is difficult to discern from the surrounding infiltrate which now surrounds it. This lesion is of uncertain etiology. Cavitary neoplasm is of concern. A focal  mycetoma is a differential consideration. The lentiform shaped mass along the left pleura appears stable as well.  Sludge or small gallstones present in gallbladder. Gallbladder wall does not appear thickened.  Gynecomastia.  Tube and catheter positions as described without pneumothorax.   Electronically Signed   By: Lowella Grip M.D.   On: 01/02/2014 07:52   Dg Chest Port 1 View  01/03/2014   CLINICAL DATA:  Respiratory failure  EXAM: PORTABLE CHEST - 1 VIEW  COMPARISON:  01/02/2014  FINDINGS: Endotracheal tube in good position. Left jugular catheter tip in the SVC. No pneumothorax. NG tube tip in the stomach.  Improved aeration right upper lobe with resolution of right upper lobe collapse.  Bibasilar atelectasis remains with small pleural effusions. Negative for edema.  IMPRESSION: Improvement in right upper lobe collapse.  Mild bibasilar atelectasis and small bilateral effusions are present.   Electronically Signed   By: Franchot Gallo M.D.   On: 01/03/2014 07:16   Dg Chest Port 1 View  01/02/2014   CLINICAL DATA:  Right lobectomy intubated patient  EXAM: PORTABLE CHEST - 1 VIEW  COMPARISON:  Portable chest x-ray of January 01, 2014  FINDINGS: There remains volume loss on the right. There is pleural thickening and fluid surrounding the aerated portion of the right lung. There is mediastinal shift towards the right. On the left the lung is mildly hyperinflated. The interstitial markings are increased though stable. There are stable peripheral confluent densities in the mid and lower left lung. The cardiac silhouette is top-normal in size. The pulmonary vascularity is prominent centrally.  The endotracheal tube tip projects 3.3 cm above the crotch of the carina. The esophagogastric tube tip projects below the inferior margin of the image. The left internal jugular venous catheter tip crosses the midline and likely lies at the level of the medial portion of the brachiocephalic vein. This is stable.   IMPRESSION: 1. Stable appearance of the hypoinflated right lung with post lobectomy changes. Pleural fluid surrounds the lung and is unchanged. 2. On the left airspace opacity at the lung base persists and density peripherally in the left mid lung most compatible with loculated pleural effusion is unchanged. 3. The support tubes and lines are in appropriate position.   Electronically Signed   By: David  [place]   On: 01/02/2014 08:00   Portable Chest Xray  01/01/2014   CLINICAL DATA:  LEFT central line placement, intubation, respiratory failure  EXAM: PORTABLE CHEST - 1 VIEW  COMPARISON:  Portable exam at 1422 hr compared to 0511 hr and correlated to CT chest of 12/01/2013  FINDINGS: Tip of endotracheal tube projects approximately 3.8 cm above carina.  LEFT jugular central venous catheter tip projects over SVC.  Rotated to the RIGHT.  Nasogastric tube extends into the abdomen/stomach.  Decreased atelectasis in RIGHT lung though significant upper lobe atelectasis and postsurgical changes are still identified.  Large RIGHT pleural effusion.  Scattered airspace infiltrate mid to lower LEFT lung.  Peripheral pleural-based opacity in the lateral LEFT chest could represent loculated effusion or mass, unchanged from prior CT.  Tiny LEFT pleural effusion.  No pneumothorax.  IMPRESSION: Improving aeration within RIGHT lung no significant atelectasis in large RIGHT pleural effusion persists.  Persistent infiltrate and small pleural effusion at lower LEFT chest with again identified pleural-based mass versus loculated fluid at the lateral mid LEFT chest.   Electronically Signed   By: Lavonia Dana M.D.   On: 01/01/2014 14:49   Dg Abd Portable 1v  01/01/2014   CLINICAL DATA:  Nasogastric tube placement.  EXAM: PORTABLE ABDOMEN - 1 VIEW  COMPARISON:  None.  FINDINGS: A nasogastric tube is seen which is looped within the proximal stomach. Endotracheal tube also seen with tip approximately 1 cm above carina. Right pleural  effusion noted.  IMPRESSION: Nasogastric tube in proximal stomach. Endotracheal tube tip approximately 1 cm above carina.   Electronically Signed   By: Earle Gell M.D.   On: 01/01/2014 14:50   ASSESSMENT /  PLAN:  PULMONARY ETT 12/22 >> 12/24 A: Acute on chronic hypercarbic respiratory failure 2nd to PNA. Chronic R pleural thickening of unclear etiology. R lung collapse, resolved with intubation. P:   Proceed with extubation 12/24 F/u CXR Oxygen to keep SpO2 > 92%  CARDIOVASCULAR Lt IJ CVL 12/22 >> A: Hx of CAD, HTN, HLD, chronic combined CHF, PVD. PAF > in sinus rhythm >> Not considered candidate for chronic anticoagulation. P:  Continue amiodarone, ASA, plavix, lopressor, lipitor, imdur Hold outpt norvasc, hydralazine, ranexa for now  RENAL A: Acute kidney injury >> improved. Hx of BPH, prostate cancer, CKD. Hypokalemia. P:   Monitor renal fx, urine outpt, electrolytes Lasix 40 mg IV x one 12/24 Hold Proscar, flomax, tasigna for now  GASTROINTESTINAL A: Nutrition. Hx of GERD. P:   Protonix for SUP Advance diet after extubation.  HEMATOLOGIC A: Anemia of chronic disease, critical illness. Hx of CML. Thrombocytopenia 2nd to sepsis, improving. P:  DVT px: SQ heparin F/u CBC  INFECTIOUS A: Pneumonia. P:   Day 5 of cefepime  Blood cx 12/20 >>  ENDOCRINE A: DM 2 with renal complications. Hx of gout. P:   SSI Hold allopurinol for now  NEUROLOGIC A: Acute metabolic encephalopathy >> resolved. Neuropathy. P:   Monitor mental status Hold lyrica for now   CC time 35 minutes.  Chesley Mires, MD [facility] Pulmonary/Critical Care 01/03/2014, 11:39 AM Pager:  [phone] After 3pm call: [phone]

## 2014-01-03 NOTE — Progress Notes (Signed)
Patient sitting in bed and intubated. He is alert and motioning for his ET tube to be removed. Nursing noted he cam off diprovan this morning. Also they noted he continues to have lots of secretions.   We appreciate the care provided to Dale Taylor by the CCM service. Once he is stable to be transferred to the floor we will gladly accept him onto our service.   Dale Haven, MD PGY-3, [place] [facility] 01/03/2014, 10:59 AM FPTS Service pager: [phone] (text pages welcome through [facility])

## 2014-01-03 NOTE — Progress Notes (Signed)
[place] Progress Note Patient Name: Dale Taylor DOB: 1940/01/15 MRN: [id number]   Date of Service  01/03/2014  HPI/Events of Note    eICU Interventions  Hypokalemia -repleted      Intervention Category Intermediate Interventions: Electrolyte abnormality - evaluation and management  [name] V. 01/03/2014, 6:20 AM

## 2014-01-04 ENCOUNTER — Inpatient Hospital Stay (HOSPITAL_COMMUNITY): Payer: Medicare HMO

## 2014-01-04 LAB — BASIC METABOLIC PANEL
Anion gap: 4 — ABNORMAL LOW (ref 5–15)
BUN: 35 mg/dL — ABNORMAL HIGH (ref 6–23)
CO2: 31 mmol/L (ref 19–32)
Calcium: 8.3 mg/dL — ABNORMAL LOW (ref 8.4–10.5)
Chloride: 111 mEq/L (ref 96–112)
Creatinine, Ser: 1.46 mg/dL — ABNORMAL HIGH (ref 0.50–1.35)
GFR calc Af Amer: 53 mL/min — ABNORMAL LOW (ref >90)
GFR calc non Af Amer: 46 mL/min — ABNORMAL LOW (ref >90)
Glucose, Bld: 120 mg/dL — ABNORMAL HIGH (ref 70–99)
Potassium: 3.5 mmol/L (ref 3.5–5.1)
Sodium: 146 mmol/L — ABNORMAL HIGH (ref 135–145)

## 2014-01-04 LAB — GLUCOSE, CAPILLARY
Glucose-Capillary: 110 mg/dL — ABNORMAL HIGH (ref 70–99)
Glucose-Capillary: 86 mg/dL (ref 70–99)
Glucose-Capillary: 112 mg/dL — ABNORMAL HIGH (ref 70–99)
Glucose-Capillary: 109 mg/dL — ABNORMAL HIGH (ref 70–99)

## 2014-01-04 LAB — CBC
HCT: 25.5 % — ABNORMAL LOW (ref 39.0–52.0)
Hemoglobin: 7.9 g/dL — ABNORMAL LOW (ref 13.0–17.0)
MCH: 27.7 pg (ref 26.0–34.0)
MCHC: 31 g/dL (ref 30.0–36.0)
MCV: 89.5 fL (ref 78.0–100.0)
Platelets: 181 10*3/uL (ref 150–400)
RBC: 2.85 MIL/uL — ABNORMAL LOW (ref 4.22–5.81)
RDW: 16.3 % — ABNORMAL HIGH (ref 11.5–15.5)
WBC: 4.2 10*3/uL (ref 4.0–10.5)

## 2014-01-04 MED ORDER — ACETYLCYSTEINE 20 % IN SOLN
3.0000 mL | Freq: Four times a day (QID) | RESPIRATORY_TRACT | Status: DC
  Administered 2014-01-04 – 2014-01-05 (×4): 3 mL via RESPIRATORY_TRACT
  Administered 2014-01-05: 12:00:00 via RESPIRATORY_TRACT
  Administered 2014-01-05 – 2014-01-06 (×4): 3 mL via RESPIRATORY_TRACT
  Administered 2014-01-06: 4 mL via RESPIRATORY_TRACT
  Administered 2014-01-07 – 2014-01-08 (×6): 3 mL via RESPIRATORY_TRACT
  Filled 2014-01-04 (×23): qty 4

## 2014-01-04 MED ORDER — ALBUTEROL SULFATE (2.5 MG/3ML) 0.083% IN NEBU
2.5000 mg | INHALATION_SOLUTION | Freq: Four times a day (QID) | RESPIRATORY_TRACT | Status: DC
  Administered 2014-01-04 – 2014-02-04 (×122): 2.5 mg via RESPIRATORY_TRACT
  Filled 2014-01-04 (×122): qty 3

## 2014-01-04 MED ORDER — OXYCODONE-ACETAMINOPHEN 5-325 MG PO TABS
1.0000 | ORAL_TABLET | Freq: Four times a day (QID) | ORAL | Status: DC | PRN
  Administered 2014-01-04 – 2014-01-06 (×6): 1 via ORAL
  Filled 2014-01-04 (×6): qty 1

## 2014-01-04 NOTE — Progress Notes (Signed)
PULMONARY / CRITICAL CARE MEDICINE   Name: Dale Taylor MRN: [id number] DOB: 1940/03/22    ADMISSION DATE:  12/31/2013 CONSULTATION DATE:  12/20  REFERRING MD :  FPTS  CHIEF COMPLAINT:  Respiratory failure    INITIAL PRESENTATION:  73 yo male admitted with A on C renal failure, hyperkalemia, HCAP, UTI.  PCCM assumed care in ICU.  STUDIES:  12/21 TTE >> EF 40 to 49%, grade 1 diastolic dysfx 70/26 Renal US >> negative 12/22 CT chest >> extensive pleural thickening on R. R lung with reduced volume. Bilateral R>L AS dz  SIGNIFICANT EVENTS: 12/21 PAF with RVR. Amiodarone started. Converted to NSR. Cards consult obtained. Recommended continuing current rx. No anticoagulation given high risk 12/22 Increased dyspnea. White out of R hemithorax on CXR. Intubated. Copious purulent secretions from ETT.  12/23 Copious purulent ET secretion. R lung better aerated. Cr improved 12/24 extubated 12/25 Add chest PT  SUBJECTIVE:  C/o headache.  Has cough with sputum.  VITAL SIGNS: Temp:  [98.3 F (36.8 C)-98.7 F (37.1 C)] 98.5 F (36.9 C) (12/25 0805) Pulse Rate:  [64-135] 66 (12/25 0700) Resp:  [9-31] 31 (12/25 0700) BP: (114-150)/(51-77) 135/55 mmHg (12/25 0700) SpO2:  [87 %-100 %] 95 % (12/25 0700) FiO2 (%):  [40 %] 40 % (12/24 1100) Weight:  [211 lb 6.7 oz (95.9 kg)] 211 lb 6.7 oz (95.9 kg) (12/25 0300) INTAKE / OUTPUT:  Intake/Output Summary (Last 24 hours) at 01/04/14 0829 Last data filed at 01/04/14 0700  Gross per 24 hour  Intake   1265 ml  Output   2285 ml  Net  -1020 ml    PHYSICAL EXAMINATION: General: no distress Neuro: follows commands, normal strength HEENT: no sinus tenderness Cardiovascular: regular Lungs: decreased BS on Rt Abdomen:  Obese, soft, +bs, suprapubic catheter intact  Ext:  1+ edema   LABS: CBC Recent Labs     01/02/14  0500  01/03/14  0500  01/04/14  0312  WBC  5.5  4.7  4.2  HGB  8.8*  8.2*  7.9*  HCT  27.8*  25.5*  25.5*  PLT  140*  169   181   BMET Recent Labs     01/02/14  0500  01/03/14  0500  01/04/14  0312  NA  141  143  146*  K  3.1*  3.5  3.5  CL  105  110  111  CO2  25  26  31   BUN  45*  42*  35*  CREATININE  2.09*  1.72*  1.46*  GLUCOSE  108*  86  120*    Electrolytes Recent Labs     01/02/14  0500  01/03/14  0500  01/04/14  0312  CALCIUM  8.0*  8.1*  8.3*    Sepsis Markers Recent Labs     01/03/14  0500  PROCALCITON  0.26   Liver Enzymes Recent Labs     01/02/14  0500  AST  22  ALT  28  ALKPHOS  76  BILITOT  0.5  ALBUMIN  2.4*   Glucose Recent Labs     01/03/14  0403  01/03/14  0820  01/03/14  1207  01/03/14  1534  01/03/14  2158  01/04/14  0739  GLUCAP  73  114*  115*  111*  116*  110*    Imaging Dg Chest Port 1 View  01/04/2014   CLINICAL DATA:  Pneumonia  EXAM: PORTABLE CHEST - 1 VIEW  COMPARISON:  01/03/2014  FINDINGS: Cardiac shadow is stable. A left jugular central venous line is again seen. The endotracheal tube and nasogastric catheter have been removed. There is significant decreased aeration identified in the right lung when compare with the previous day. Significant increase in right-sided pleural effusion is noted as well. The left lung is well aerated with some patchy edematous changes.  IMPRESSION: Status post endotracheal tube and nasogastric catheter removal  Significant increase in the degree of right-sided pleural effusion with decreased aeration in the right lung.  Mild edematous changes in the left lung.   Electronically Signed   By: Inez Catalina M.D.   On: 01/04/2014 07:32   Dg Chest Port 1 View  01/03/2014   CLINICAL DATA:  Respiratory failure  EXAM: PORTABLE CHEST - 1 VIEW  COMPARISON:  01/02/2014  FINDINGS: Endotracheal tube in good position. Left jugular catheter tip in the SVC. No pneumothorax. NG tube tip in the stomach.  Improved aeration right upper lobe with resolution of right upper lobe collapse.  Bibasilar atelectasis remains with small pleural  effusions. Negative for edema.  IMPRESSION: Improvement in right upper lobe collapse.  Mild bibasilar atelectasis and small bilateral effusions are present.   Electronically Signed   By: Franchot Gallo M.D.   On: 01/03/2014 07:16   ASSESSMENT / PLAN:  PULMONARY ETT 12/22 >> 12/24 A: Acute on chronic hypercarbic respiratory failure 2nd to PNA. Chronic R pleural thickening of unclear etiology. R lung collapse, resolved with intubation. P:   Add albuterol/mucomyst/chest PT 12/25 F/u CXR Oxygen to keep SpO2 > 92%  CARDIOVASCULAR Lt IJ CVL 12/22 >> A: Hx of CAD, HTN, HLD, chronic combined CHF, PVD. PAF > in sinus rhythm >> Not considered candidate for chronic anticoagulation. P:  Continue amiodarone, ASA, plavix, lopressor, lipitor, imdur Hold outpt norvasc, hydralazine, ranexa for now  RENAL A: Acute kidney injury >> improved. Hx of BPH, prostate cancer, CKD. Hypokalemia. P:   Monitor renal fx, urine outpt, electrolytes Hold Proscar, flomax, tasigna for now  GASTROINTESTINAL A: Nutrition. Hx of GERD. P:   Protonix for SUP Carb modified diet  HEMATOLOGIC A: Anemia of chronic disease, critical illness. Hx of CML. Thrombocytopenia 2nd to sepsis, improving. P:  DVT px: SQ heparin F/u CBC  INFECTIOUS A: Pneumonia. 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 P:   SSI Hold allopurinol for now  NEUROLOGIC A: Acute metabolic encephalopathy >> resolved. Neuropathy. P:   Monitor mental status Hold lyrica for now  SUMMARY: I think his CXR findings represent mucus plugging rather than effusion.  Will keep in ICU and augment bronchial hygiene.    Chesley Mires, MD [facility] Pulmonary/Critical Care 01/04/2014, 8:29 AM Pager:  [phone] After 3pm call: [phone]

## 2014-01-04 NOTE — Progress Notes (Signed)
Patient sitting in bed eating breakfast. Was extubated yesterday. Per ICU note it appears that they will keep in the ICU to work on pulmonary hygiene.  We appreciate the care provided to Mr. Vanhouten by the CCM service. Once he is stable to be transferred to the floor we will gladly accept him onto our service.   Leone Haven, MD PGY-3, [facility] 01/04/2014, 9:26 AM FPTS Service pager: [phone] (text pages welcome through [facility])

## 2014-01-05 ENCOUNTER — Inpatient Hospital Stay (HOSPITAL_COMMUNITY): Payer: Medicare HMO

## 2014-01-05 LAB — CBC
HEMATOCRIT: 27.7 % — AB (ref 39.0–52.0)
Hemoglobin: 8.5 g/dL — ABNORMAL LOW (ref 13.0–17.0)
MCH: 27.8 pg (ref 26.0–34.0)
MCHC: 30.7 g/dL (ref 30.0–36.0)
MCV: 90.5 fL (ref 78.0–100.0)
Platelets: 213 10*3/uL (ref 150–400)
RBC: 3.06 MIL/uL — AB (ref 4.22–5.81)
RDW: 16 % — AB (ref 11.5–15.5)
WBC: 4.6 10*3/uL (ref 4.0–10.5)

## 2014-01-05 LAB — BASIC METABOLIC PANEL
Anion gap: 6 (ref 5–15)
BUN: 26 mg/dL — ABNORMAL HIGH (ref 6–23)
CALCIUM: 8.5 mg/dL (ref 8.4–10.5)
CO2: 31 mmol/L (ref 19–32)
Chloride: 110 mEq/L (ref 96–112)
Creatinine, Ser: 1.39 mg/dL — ABNORMAL HIGH (ref 0.50–1.35)
GFR calc Af Amer: 56 mL/min — ABNORMAL LOW (ref >90)
GFR, EST NON AFRICAN AMERICAN: 49 mL/min — AB (ref >90)
GLUCOSE: 110 mg/dL — AB (ref 70–99)
Potassium: 3.8 mmol/L (ref 3.5–5.1)
Sodium: 147 mmol/L — ABNORMAL HIGH (ref 135–145)

## 2014-01-05 LAB — CULTURE, BLOOD (ROUTINE X 2)
CULTURE: NO GROWTH
Culture: NO GROWTH

## 2014-01-05 LAB — GLUCOSE, CAPILLARY
GLUCOSE-CAPILLARY: 98 mg/dL (ref 70–99)
GLUCOSE-CAPILLARY: 114 mg/dL — AB (ref 70–99)
Glucose-Capillary: 90 mg/dL (ref 70–99)
Glucose-Capillary: 169 mg/dL — ABNORMAL HIGH (ref 70–99)

## 2014-01-05 MED ORDER — CHLORHEXIDINE GLUCONATE 0.12 % MT SOLN
15.0000 mL | Freq: Two times a day (BID) | OROMUCOSAL | Status: DC
  Administered 2014-01-05 – 2014-01-08 (×7): 15 mL via OROMUCOSAL
  Filled 2014-01-05 (×7): qty 15

## 2014-01-05 MED ORDER — DEXTROSE 5 % IV SOLN
1.0000 g | Freq: Two times a day (BID) | INTRAVENOUS | Status: AC
  Administered 2014-01-05 – 2014-01-07 (×5): 1 g via INTRAVENOUS
  Filled 2014-01-05 (×5): qty 1

## 2014-01-05 MED ORDER — CETYLPYRIDINIUM CHLORIDE 0.05 % MT LIQD
7.0000 mL | Freq: Two times a day (BID) | OROMUCOSAL | Status: DC
  Administered 2014-01-06 – 2014-01-08 (×6): 7 mL via OROMUCOSAL

## 2014-01-05 NOTE — Progress Notes (Signed)
Patient feeling much better this morning. Extubated 2 days ago, now on room air. Sitting in bed eating breakfast.   We appreciate the care provided to Mr. Afonso by the CCM service. Once he is stable to be transferred to the floor we will gladly accept him onto our service.   [name]. Jerline Pain, [place] Resident PGY-1 01/05/2014 9:04 AM

## 2014-01-05 NOTE — Progress Notes (Signed)
PULMONARY / CRITICAL CARE MEDICINE   Name: Dale Taylor MRN: [id number] DOB: Apr 19, 1940    ADMISSION DATE:  12/19/2013 CONSULTATION DATE:  12/20  REFERRING MD :  FPTS  CHIEF COMPLAINT:  Respiratory failure    INITIAL PRESENTATION:  73 yo male admitted with A on C renal failure, hyperkalemia, HCAP, UTI.  PCCM assumed care in ICU.  STUDIES:  12/21 TTE >> EF 40 to 32%, grade 1 diastolic dysfx 20/25 Renal US >> negative 12/22 CT chest >> extensive pleural thickening on R. R lung with reduced volume. Bilateral R>L AS dz  SIGNIFICANT EVENTS: 12/21 PAF with RVR. Amiodarone started. Converted to NSR. Cards consult obtained. Recommended continuing current rx. No anticoagulation given high risk 12/22 Increased dyspnea. White out of R hemithorax on CXR. Intubated. Copious purulent secretions from ETT.  12/23 Copious purulent ET secretion. R lung better aerated. Cr improved 12/24 extubated 12/25 Add chest PT  SUBJECTIVE:  Bringing up sputum more easily.  Feels breathing has improved compared to yesterday.  VITAL SIGNS: Temp:  [97.3 F (36.3 C)-99 F (37.2 C)] 99 F (37.2 C) (12/26 0739) Pulse Rate:  [63-71] 69 (12/26 0700) Resp:  [16-26] 22 (12/26 0700) BP: (120-153)/(49-66) 135/60 mmHg (12/26 0700) SpO2:  [90 %-98 %] 94 % (12/26 0700) Weight:  [214 lb 15.2 oz (97.5 kg)] 214 lb 15.2 oz (97.5 kg) (12/26 0500) INTAKE / OUTPUT:  Intake/Output Summary (Last 24 hours) at 01/05/14 0952 Last data filed at 01/05/14 0600  Gross per 24 hour  Intake    120 ml  Output   1215 ml  Net  -1095 ml    PHYSICAL EXAMINATION: General: no distress Neuro: follows commands, normal strength HEENT: no sinus tenderness Cardiovascular: regular Lungs: decreased BS on Rt Abdomen:  Obese, soft, +bs, suprapubic catheter intact  Ext:  1+ edema   LABS: CBC Recent Labs     01/03/14  0500  01/04/14  0312  01/05/14  0245  WBC  4.7  4.2  4.6  HGB  8.2*  7.9*  8.5*  HCT  25.5*  25.5*  27.7*  PLT   169  181  213   BMET Recent Labs     01/03/14  0500  01/04/14  0312  01/05/14  0245  NA  143  146*  147*  K  3.5  3.5  3.8  CL  110  111  110  CO2  26  31  31   BUN  42*  35*  26*  CREATININE  1.72*  1.46*  1.39*  GLUCOSE  86  120*  110*    Electrolytes Recent Labs     01/03/14  0500  01/04/14  0312  01/05/14  0245  CALCIUM  8.1*  8.3*  8.5    Sepsis Markers Recent Labs     01/03/14  0500  PROCALCITON  0.26   Glucose Recent Labs     01/03/14  2158  01/04/14  0739  01/04/14  1134  01/04/14  1511  01/04/14  2111  01/05/14  0733  GLUCAP  116*  110*  86  112*  109*  98    Imaging Dg Chest Port 1 View  01/05/2014   CLINICAL DATA:  Mucous plugging.  EXAM: PORTABLE CHEST - 1 VIEW  COMPARISON:  01/04/2014 and 01/03/2014  FINDINGS: Left IJ central venous catheter is unchanged with tip obliquely oriented in the expected region of the SVC. There are continued postsurgical changes of the right hemi thorax. Near complete opacification  of the right long with slightly better aeration over the mid to upper lung compared to the recent prior exam. Opacification over the left lung in the perihilar region with suggestion of left pleural effusion slightly worse. Remainder of the exam is unchanged.  IMPRESSION: Continued near complete opacification of the right lung with slightly improved aeration over the mid to upper lung. Slight worsening of hazy opacification over the left perihilar region with left effusion.  Left IJ central venous catheter unchanged.   Electronically Signed   By: Marin Olp M.D.   On: 01/05/2014 08:41   Dg Chest Port 1 View  01/04/2014   CLINICAL DATA:  Pneumonia  EXAM: PORTABLE CHEST - 1 VIEW  COMPARISON:  01/03/2014  FINDINGS: Cardiac shadow is stable. A left jugular central venous line is again seen. The endotracheal tube and nasogastric catheter have been removed. There is significant decreased aeration identified in the right lung when compare with the  previous day. Significant increase in right-sided pleural effusion is noted as well. The left lung is well aerated with some patchy edematous changes.  IMPRESSION: Status post endotracheal tube and nasogastric catheter removal  Significant increase in the degree of right-sided pleural effusion with decreased aeration in the right lung.  Mild edematous changes in the left lung.   Electronically Signed   By: Inez Catalina M.D.   On: 01/04/2014 07:32   ASSESSMENT / PLAN:  PULMONARY ETT 12/22 >> 12/24 A: Acute on chronic hypercarbic respiratory failure 2nd to PNA. Chronic R pleural thickening of unclear etiology. R lung collapse. P:   Added albuterol/mucomyst/chest PT 12/25 F/u CXR 12/27 >> if no improvement then consider bronchoscopy Oxygen to keep SpO2 > 92%  CARDIOVASCULAR Lt IJ CVL 12/22 >> A: Hx of CAD, HTN, HLD, chronic combined CHF, PVD. PAF > in sinus rhythm >> Not considered candidate for chronic anticoagulation. P:  Continue amiodarone, ASA, plavix, lopressor, lipitor, imdur Hold outpt norvasc, hydralazine, ranexa for now  RENAL A: Acute kidney injury >> improved. Hx of BPH, prostate cancer, CKD. Hypokalemia. P:   Monitor renal fx, urine outpt, electrolytes Hold Proscar, flomax, tasigna for now  GASTROINTESTINAL A: Nutrition. Hx of GERD. P:   Protonix for SUP Carb modified diet  HEMATOLOGIC A: Anemia of chronic disease, critical illness. Hx of CML. Thrombocytopenia 2nd to sepsis >> resolved. P:  DVT px: SQ heparin F/u CBC intermittently  INFECTIOUS A: Pneumonia. P:   Day 7/x of cefepime >> consider d/c Abx soon if CXR improves   Blood cx 12/20 >>  ENDOCRINE A: DM 2 with renal complications. Hx of gout. P:   SSI Hold allopurinol for now  NEUROLOGIC A: Acute metabolic encephalopathy >> resolved. Neuropathy. P:   Monitor mental status Hold lyrica for now  SUMMARY: Continue bronchial hygiene >> if no further improvement on CXR by 12/27, then  consider bronchoscopy.  Keep in ICU for now.  Chesley Mires, MD [facility] Pulmonary/Critical Care 01/05/2014, 9:52 AM Pager:  [phone] After 3pm call: [phone]

## 2014-01-06 ENCOUNTER — Inpatient Hospital Stay (HOSPITAL_COMMUNITY): Payer: Medicare HMO

## 2014-01-06 DIAGNOSIS — J398 Other specified diseases of upper respiratory tract: Secondary | ICD-10-CM

## 2014-01-06 DIAGNOSIS — T17500A Unspecified foreign body in bronchus causing asphyxiation, initial encounter: Secondary | ICD-10-CM | POA: Insufficient documentation

## 2014-01-06 DIAGNOSIS — J9809 Other diseases of bronchus, not elsewhere classified: Secondary | ICD-10-CM

## 2014-01-06 LAB — BASIC METABOLIC PANEL
Anion gap: 6 (ref 5–15)
BUN: 22 mg/dL (ref 6–23)
CO2: 29 mmol/L (ref 19–32)
Calcium: 8.5 mg/dL (ref 8.4–10.5)
Chloride: 108 mEq/L (ref 96–112)
Creatinine, Ser: 1.26 mg/dL (ref 0.50–1.35)
GFR, EST AFRICAN AMERICAN: 64 mL/min — AB (ref >90)
GFR, EST NON AFRICAN AMERICAN: 55 mL/min — AB (ref >90)
Glucose, Bld: 118 mg/dL — ABNORMAL HIGH (ref 70–99)
POTASSIUM: 4.1 mmol/L (ref 3.5–5.1)
Sodium: 143 mmol/L (ref 135–145)

## 2014-01-06 LAB — POCT I-STAT 3, ART BLOOD GAS (G3+)
Acid-Base Excess: 1 mmol/L (ref 0.0–2.0)
BICARBONATE: 27.7 meq/L — AB (ref 20.0–24.0)
O2 SAT: 88 %
PCO2 ART: 58 mmHg — AB (ref 35.0–45.0)
PO2 ART: 63 mmHg — AB (ref 80.0–100.0)
Patient temperature: 98.6
TCO2: 29 mmol/L (ref 0–100)
pH, Arterial: 7.287 — ABNORMAL LOW (ref 7.350–7.450)

## 2014-01-06 LAB — GLUCOSE, CAPILLARY
Glucose-Capillary: 88 mg/dL (ref 70–99)
Glucose-Capillary: 112 mg/dL — ABNORMAL HIGH (ref 70–99)
Glucose-Capillary: 94 mg/dL (ref 70–99)
Glucose-Capillary: 91 mg/dL (ref 70–99)

## 2014-01-06 MED ORDER — NITROGLYCERIN 2 % TD OINT
1.0000 [in_us] | TOPICAL_OINTMENT | Freq: Four times a day (QID) | TRANSDERMAL | Status: DC
  Administered 2014-01-06 – 2014-01-11 (×17): 1 [in_us] via TOPICAL
  Filled 2014-01-06 (×2): qty 30

## 2014-01-06 MED ORDER — MIDAZOLAM HCL 2 MG/2ML IJ SOLN
4.0000 mg | Freq: Once | INTRAMUSCULAR | Status: AC
  Administered 2014-01-06: 4 mg via INTRAVENOUS

## 2014-01-06 MED ORDER — MIDAZOLAM HCL 2 MG/2ML IJ SOLN: 4.0000 mg | Freq: Once | INTRAMUSCULAR | Status: AC

## 2014-01-06 MED ORDER — FENTANYL CITRATE 0.05 MG/ML IJ SOLN
100.0000 ug | Freq: Once | INTRAMUSCULAR | Status: AC
  Administered 2014-01-06: 100 ug via INTRAVENOUS

## 2014-01-06 MED ORDER — MIDAZOLAM HCL 2 MG/2ML IJ SOLN
INTRAMUSCULAR | Status: AC
  Filled 2014-01-06: qty 4

## 2014-01-06 MED ORDER — PANTOPRAZOLE SODIUM 40 MG IV SOLR
40.0000 mg | INTRAVENOUS | Status: DC
  Administered 2014-01-06 – 2014-01-08 (×3): 40 mg via INTRAVENOUS
  Filled 2014-01-06 (×4): qty 40

## 2014-01-06 MED ORDER — BUTAMBEN-TETRACAINE-BENZOCAINE 2-2-14 % EX AERO
1.0000 | INHALATION_SPRAY | Freq: Four times a day (QID) | CUTANEOUS | Status: DC | PRN
  Filled 2014-01-06: qty 56

## 2014-01-06 MED ORDER — METOPROLOL TARTRATE 1 MG/ML IV SOLN
5.0000 mg | Freq: Four times a day (QID) | INTRAVENOUS | Status: DC
  Administered 2014-01-06 – 2014-01-09 (×9): 5 mg via INTRAVENOUS
  Filled 2014-01-06 (×14): qty 5

## 2014-01-06 MED ORDER — FENTANYL CITRATE 0.05 MG/ML IJ SOLN
INTRAMUSCULAR | Status: AC
  Filled 2014-01-06: qty 2

## 2014-01-06 MED ORDER — BENZOCAINE (TOPICAL) 20 % EX AERO: INHALATION_SPRAY | Freq: Four times a day (QID) | CUTANEOUS | Status: DC | PRN

## 2014-01-06 MED ORDER — FENTANYL CITRATE 0.05 MG/ML IJ SOLN: 50.0000 ug | Freq: Once | INTRAMUSCULAR | Status: AC

## 2014-01-06 NOTE — Procedures (Signed)
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  Procedure Details Consent: Risks of procedure as well as the alternatives and risks of each were explained to the (patient/caregiver).  Consent for procedure obtained. Time Out: Verified patient identification, verified procedure, site/side was marked, verified correct patient position, special equipment/implants available, medications/allergies/relevent history reviewed, required imaging and test results available.  Performed  Procedure done in ICU.  He was given cetacaine spray for anesthesia of posterior pharynx.  He was given 4 mg versed and 50 mcg fentanyl for sedation and analgesia.  He had oxygen desaturation on nasal canula and was placed on non rebreather mask.  He still had low oxygen level, but improved enough to proceed.  Bronchoscope was entered orally.  He had normal motion of vocal cords.  Bronchoscope entered into trach with visualization of carina.  He had collapse of airways diffusely with expiration.    The bronchoscope was entered into the left main bronchus.  The left upper, lingular, and lower lobe orifices were visualized.  The airways were friable, but no endobronchial lesions.  The bronchoscope was then entered into right main bronchus.  The take off for Rt upper, middle, and lower lobes were visualized.  There was some clear to yellow secretions from this area, but main issue appeared to be collapse of airways with expiration.  Instilled 4 ml of mucomyst followed by 10 ml of saline to assist with clearance of secretions.    The bronchoscope was withdrawn.  His oxygenation improved post-procedure.  His hemodynamics were otherwise stable.  Will continue to monitor in ICU.  Will initiate BiPAP qhs and prn during the day.  Chesley Mires, MD [facility] Pulmonary/Critical Care 01/06/2014, 3:52  PM Pager:  [phone] After 3pm call: [phone]

## 2014-01-06 NOTE — Progress Notes (Signed)
Family medicine teaching service social note.   Patient feels largely unchanged. He was extubated 3 days ago and is continued on ICU status due to severe infiltrate that may warrant a bronchoscopy.   We appreciate the care provided to Mr. Borrelli by the CCM service. Once he is stable to be transferred to the floor we will gladly accept him onto our service.   Laroy Apple, MD [place] Resident, PGY-3 01/06/2014, 8:40 AM

## 2014-01-06 NOTE — Progress Notes (Signed)
PULMONARY / CRITICAL CARE MEDICINE   Name: Dale Taylor MRN: [id number] DOB: Jun 26, 1940    ADMISSION DATE:  12/15/2013 CONSULTATION DATE:  12/20  REFERRING MD :  FPTS  CHIEF COMPLAINT:  Respiratory failure    INITIAL PRESENTATION:  73 yo male admitted with A on C renal failure, hyperkalemia, HCAP, UTI.  PCCM assumed care in ICU.  STUDIES:  12/21 TTE >> EF 40 to 48%, grade 1 diastolic dysfx 18/56 Renal US >> negative 12/22 CT chest >> extensive pleural thickening on R. R lung with reduced volume. Bilateral R>L AS dz  SIGNIFICANT EVENTS: 12/21 PAF with RVR. Amiodarone started. Converted to NSR. Cards consult obtained. Recommended continuing current rx. No anticoagulation given high risk 12/22 Increased dyspnea. White out of R hemithorax on CXR. Intubated. Copious purulent secretions from ETT.  12/23 Copious purulent ET secretion. R lung better aerated. Cr improved 12/24 extubated 12/25 Add chest PT  SUBJECTIVE:  Feels more short of breath.  VITAL SIGNS: Temp:  [98.2 F (36.8 C)-98.9 F (37.2 C)] 98.3 F (36.8 C) (12/27 0743) Pulse Rate:  [59-72] 71 (12/27 0800) Resp:  [16-25] 23 (12/27 0800) BP: (128-159)/(46-66) 159/66 mmHg (12/27 0800) SpO2:  [85 %-96 %] 92 % (12/27 0909) Weight:  [207 lb 3.7 oz (94 kg)] 207 lb 3.7 oz (94 kg) (12/27 0500) INTAKE / OUTPUT:  Intake/Output Summary (Last 24 hours) at 01/06/14 1104 Last data filed at 01/06/14 0800  Gross per 24 hour  Intake    430 ml  Output    880 ml  Net   -450 ml    PHYSICAL EXAMINATION: General: no distress Neuro: follows commands, normal strength HEENT: no sinus tenderness Cardiovascular: regular Lungs: decreased BS on Rt Abdomen:  Obese, soft, +bs, suprapubic catheter intact  Ext:  1+ edema   LABS: CBC Recent Labs     01/04/14  0312  01/05/14  0245  WBC  4.2  4.6  HGB  7.9*  8.5*  HCT  25.5*  27.7*  PLT  181  213   BMET Recent Labs     01/04/14  0312  01/05/14  0245  01/06/14  0355  NA  146*   147*  143  K  3.5  3.8  4.1  CL  111  110  108  CO2  31  31  29   BUN  35*  26*  22  CREATININE  1.46*  1.39*  1.26  GLUCOSE  120*  110*  118*    Electrolytes Recent Labs     01/04/14  0312  01/05/14  0245  01/06/14  0355  CALCIUM  8.3*  8.5  8.5    Glucose Recent Labs     01/04/14  2111  01/05/14  0733  01/05/14  1138  01/05/14  1736  01/05/14  2201  01/06/14  0730  GLUCAP  109*  98  90  114*  169*  88    Imaging Dg Chest Port 1 View  01/06/2014   CLINICAL DATA:  Mucous plugging lobe bronchi  EXAM: PORTABLE CHEST - 1 VIEW  COMPARISON:  01/05/2014  FINDINGS: Cardiomediastinal silhouette is stable. Persistent almost complete opacification of the right hemi thorax. Stable postsurgical changes right perihilar region. There is worsening in aeration of left lung probable due to edema, infiltrate with small left pleural effusion. Stable left IJ catheter position. Stable loculated peripheral right pleural effusion.  IMPRESSION: Persistent almost complete opacification of the right hemi thorax. Stable postsurgical changes right perihilar region. There  is worsening in aeration of left lung probable due to edema or infiltrate with small left pleural effusion.   Electronically Signed   By: Lahoma Crocker M.D.   On: 01/06/2014 10:31   Dg Chest Port 1 View  01/05/2014   CLINICAL DATA:  Mucous plugging.  EXAM: PORTABLE CHEST - 1 VIEW  COMPARISON:  01/04/2014 and 01/03/2014  FINDINGS: Left IJ central venous catheter is unchanged with tip obliquely oriented in the expected region of the SVC. There are continued postsurgical changes of the right hemi thorax. Near complete opacification of the right long with slightly better aeration over the mid to upper lung compared to the recent prior exam. Opacification over the left lung in the perihilar region with suggestion of left pleural effusion slightly worse. Remainder of the exam is unchanged.  IMPRESSION: Continued near complete opacification of the  right lung with slightly improved aeration over the mid to upper lung. Slight worsening of hazy opacification over the left perihilar region with left effusion.  Left IJ central venous catheter unchanged.   Electronically Signed   By: Marin Olp M.D.   On: 01/05/2014 08:41   ASSESSMENT / PLAN:  PULMONARY ETT 12/22 >> 12/24 A: Acute on chronic hypercarbic respiratory failure 2nd to PNA. Chronic R pleural thickening of unclear etiology. R lung collapse. P:   Proceed with bronchoscopy 12/27 Oxygen to keep SpO2 > 92% F/u CXR Continue bronchial hygiene  CARDIOVASCULAR Lt IJ CVL 12/22 >> A: Hx of CAD, HTN, HLD, chronic combined CHF, PVD. PAF > in sinus rhythm >> Not considered candidate for chronic anticoagulation. P:  Continue amiodarone, ASA, plavix, lopressor, lipitor, imdur Hold outpt norvasc, hydralazine, ranexa for now  RENAL A: Acute kidney injury >> improved. Hx of BPH, prostate cancer, CKD. Hypokalemia. P:   Monitor renal fx, urine outpt, electrolytes Hold Proscar, flomax, tasigna for now  GASTROINTESTINAL A: Nutrition. Hx of GERD. P:   Protonix for SUP NPO for bronch  HEMATOLOGIC A: Anemia of chronic disease, critical illness. Hx of CML. Thrombocytopenia 2nd to sepsis >> resolved. P:  DVT px: SQ heparin F/u CBC intermittently  INFECTIOUS A: Pneumonia. 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 P:   SSI Hold allopurinol for now  NEUROLOGIC A: Acute metabolic encephalopathy >> resolved. Neuropathy. P:   Monitor mental status Hold lyrica for now  SUMMARY: Plan for bronch this PM.  Procedure explained to pt.  Risks detailed as bleeding, pneumothorax, and respiratory failure.  Explained that he might need intubation for procedure.  Chesley Mires, MD [facility] Pulmonary/Critical Care 01/06/2014, 11:04 AM Pager:  [phone] After 3pm call: [phone]

## 2014-01-06 NOTE — Progress Notes (Signed)
eLink Physician-Brief Progress Note Patient Name: Dale Taylor DOB: 07-Sep-1940 MRN: [id number]   Date of Service  01/06/2014  HPI/Events of Note  Need to change to IV route for meds due to concern not able to swallow  eICU Interventions    ppi, lopressor changed to IV Change ntg to paste Hold plavix/amiodarone for now      Intervention Category Minor Interventions: Routine modifications to care plan (e.g. PRN medications for pain, fever)  [name] 01/06/2014, 9:21 PM

## 2014-01-07 ENCOUNTER — Inpatient Hospital Stay (HOSPITAL_COMMUNITY): Payer: Medicare HMO

## 2014-01-07 DIAGNOSIS — J9819 Other pulmonary collapse: Secondary | ICD-10-CM

## 2014-01-07 LAB — GLUCOSE, CAPILLARY
GLUCOSE-CAPILLARY: 80 mg/dL (ref 70–99)
GLUCOSE-CAPILLARY: 98 mg/dL (ref 70–99)
Glucose-Capillary: 78 mg/dL (ref 70–99)
Glucose-Capillary: 83 mg/dL (ref 70–99)

## 2014-01-07 LAB — CBC
HCT: 28.9 % — ABNORMAL LOW (ref 39.0–52.0)
HEMOGLOBIN: 8.6 g/dL — AB (ref 13.0–17.0)
MCH: 27.5 pg (ref 26.0–34.0)
MCHC: 29.8 g/dL — ABNORMAL LOW (ref 30.0–36.0)
MCV: 92.3 fL (ref 78.0–100.0)
PLATELETS: 261 10*3/uL (ref 150–400)
RBC: 3.13 MIL/uL — AB (ref 4.22–5.81)
RDW: 16 % — ABNORMAL HIGH (ref 11.5–15.5)
WBC: 5.7 10*3/uL (ref 4.0–10.5)

## 2014-01-07 LAB — BASIC METABOLIC PANEL
Anion gap: 5 (ref 5–15)
BUN: 21 mg/dL (ref 6–23)
CO2: 30 mmol/L (ref 19–32)
Calcium: 8.6 mg/dL (ref 8.4–10.5)
Chloride: 109 mEq/L (ref 96–112)
Creatinine, Ser: 1.28 mg/dL (ref 0.50–1.35)
GFR calc Af Amer: 62 mL/min — ABNORMAL LOW (ref >90)
GFR, EST NON AFRICAN AMERICAN: 54 mL/min — AB (ref >90)
GLUCOSE: 93 mg/dL (ref 70–99)
POTASSIUM: 4.3 mmol/L (ref 3.5–5.1)
SODIUM: 144 mmol/L (ref 135–145)

## 2014-01-07 MED ORDER — OXYCODONE-ACETAMINOPHEN 5-325 MG PO TABS
1.0000 | ORAL_TABLET | ORAL | Status: DC | PRN
  Administered 2014-01-07 – 2014-01-08 (×8): 1 via ORAL
  Filled 2014-01-07 (×8): qty 1

## 2014-01-07 NOTE — Progress Notes (Signed)
FMTS Social Note  Patient feels the same this morning. Extubated 4 days ago, now requiring intermittent supplemental oxygen/BIPAP. Continues to require ICU care due to right lung collapse. Bronchoscopy performed yesterday.   We appreciate the care provided to Dale Taylor by the CCM service. Once he is stable to be transferred to the floor we will gladly accept him onto our service.   [name]. Jerline Pain, [place] Resident PGY-1 01/07/2014 9:36 AM

## 2014-01-07 NOTE — Progress Notes (Signed)
PULMONARY / CRITICAL CARE MEDICINE   Name: Dale Taylor MRN: [id number] DOB: 09/05/1940    ADMISSION DATE:  12/22/2013 CONSULTATION DATE:  12/20  REFERRING MD :  FPTS  CHIEF COMPLAINT:  Respiratory failure    INITIAL PRESENTATION:  73 yo male admitted with A on C renal failure, hyperkalemia, HCAP, UTI.  PCCM assumed care in ICU.  STUDIES:  12/21 TTE >> EF 40 to 53%, grade 1 diastolic dysfx 97/67 Renal US >> negative 12/21 PAF with RVR. Amiodarone started. Converted to NSR. Cards consult obtained. Recommended continuing current rx. No anticoagulation given high risk 12/22 Increased dyspnea. White out of R hemithorax on CXR. Intubated. Copious purulent secretions from ETT.  12/22 CT chest >> extensive pleural thickening on R. R lung with reduced volume. Bilateral R>L AS dz 12/23 Copious purulent ET secretion. R lung better aerated. Cr improved 12/24 extubated 12/25 Add chest PT 12/27 : Feels more short of breath.s/p bronc -  Has diffuse tracheobronchomalacia and some yellow clear secretion on right side   SUBJECTIVE/OVERNIGHT/INTERVAL HX 01/07/14: Sitting on chair with face mask. Has progressive right lung collapse. Needing high flow o2  VITAL SIGNS: Temp:  [97.4 F (36.3 C)-98.5 F (36.9 C)] 98.3 F (36.8 C) (12/28 1159) Pulse Rate:  [68-86] 86 (12/28 1200) Resp:  [10-30] 18 (12/28 1200) BP: (132-172)/(54-141) 156/86 mmHg (12/28 1200) SpO2:  [81 %-99 %] 89 % (12/28 1200) FiO2 (%):  [45 %-80 %] 80 % (12/28 0400) Weight:  [94.1 kg (207 lb 7.3 oz)] 94.1 kg (207 lb 7.3 oz) (12/28 0500) INTAKE / OUTPUT:  Intake/Output Summary (Last 24 hours) at 01/07/14 1410 Last data filed at 01/07/14 1200  Gross per 24 hour  Intake    525 ml  Output    880 ml  Net   -355 ml    PHYSICAL EXAMINATION: General: no distress Neuro: follows commands, normal strength, sitting in chair Psych: frustreated HEENT: no sinus tenderness Cardiovascular: regular Lungs: decreased BS on Rt Abdomen:   Obese, soft, +bs, suprapubic catheter intact  Ext:  1+ edema   LABS: PULMONARY  Recent Labs Lab 01/06/14 2033  PHART 7.287*  PCO2ART 58.0*  PO2ART 63.0*  HCO3 27.7*  TCO2 29  O2SAT 88.0    CBC  Recent Labs Lab 01/04/14 0312 01/05/14 0245 01/07/14 0400  HGB 7.9* 8.5* 8.6*  HCT 25.5* 27.7* 28.9*  WBC 4.2 4.6 5.7  PLT 181 213 261    COAGULATION No results for input(s): INR in the last 168 hours.  CARDIAC   Recent Labs Lab 01/01/14 0553  TROPONINI 1.42*   No results for input(s): PROBNP in the last 168 hours.   CHEMISTRY  Recent Labs Lab 01/03/14 0500 01/04/14 0312 01/05/14 0245 01/06/14 0355 01/07/14 0400  NA 143 146* 147* 143 144  K 3.5 3.5 3.8 4.1 4.3  CL 110 111 110 108 109  CO2 26 31 31 29 30   GLUCOSE 86 120* 110* 118* 93  BUN 42* 35* 26* 22 21  CREATININE 1.72* 1.46* 1.39* 1.26 1.28  CALCIUM 8.1* 8.3* 8.5 8.5 8.6   Estimated Creatinine Clearance: 60.2 mL/min (by C-G formula based on Cr of 1.28).   LIVER  Recent Labs Lab 01/01/14 0553 01/02/14 0500  AST 29 22  ALT 36 28  ALKPHOS 95 76  BILITOT 0.5 0.5  PROT 5.3* 4.8*  ALBUMIN 3.0* 2.4*     INFECTIOUS  Recent Labs Lab 01/01/14 0553 01/03/14 0500  PROCALCITON 0.21 0.26     ENDOCRINE CBG (last  3)   Recent Labs  01/06/14 2304 01/07/14 0825 01/07/14 1158  GLUCAP 91 78 83         IMAGING x48h Dg Chest Port 1 View  01/07/2014   CLINICAL DATA:  Mucous plugging.  EXAM: PORTABLE CHEST - 1 VIEW  COMPARISON:  01/06/2014.  FINDINGS: Left IJ line in stable position. Postsurgical changes right hilum. Progressive atelectasis right lung. Right pleural effusion. Diffuse mild infiltrate left lung. Heart size is difficult to evaluate due to atelectasis of the right lung and shift of the heart mediastinum to the right. No acute bony abnormality.  IMPRESSION: 1. Left IJ line in stable position. 2. Progressive atelectasis of the right lung. Persistent right pleural effusion. 3.  Diffuse left lung pulmonary infiltrate.   Electronically Signed   By: Marcello Moores  Register   On: 01/07/2014 07:24   Dg Chest Port 1 View  01/06/2014   CLINICAL DATA:  Mucous plugging lobe bronchi  EXAM: PORTABLE CHEST - 1 VIEW  COMPARISON:  01/05/2014  FINDINGS: Cardiomediastinal silhouette is stable. Persistent almost complete opacification of the right hemi thorax. Stable postsurgical changes right perihilar region. There is worsening in aeration of left lung probable due to edema, infiltrate with small left pleural effusion. Stable left IJ catheter position. Stable loculated peripheral right pleural effusion.  IMPRESSION: Persistent almost complete opacification of the right hemi thorax. Stable postsurgical changes right perihilar region. There is worsening in aeration of left lung probable due to edema or infiltrate with small left pleural effusion.   Electronically Signed   By: Lahoma Crocker M.D.   On: 01/06/2014 10:31        ASSESSMENT / PLAN:  PULMONARY ETT 12/22 >> 12/24 A: Acute on chronic hypercarbic respiratory failure 2nd to PNA. Chronic R pleural thickening of unclear etiology. R lung collapse.  01/07/14 - progressive Rt lung collapse P:   Proceed with bronchoscopy 12/27 Oxygen to keep SpO2 > 92% F/u CXR Continue bronchial hygiene; step up to vest and percussor Continue mucomyst  CARDIOVASCULAR Lt IJ CVL 12/22 >> A: Hx of CAD, HTN, HLD, chronic combined CHF, PVD. PAF > in sinus rhythm >> Not considered candidate for chronic anticoagulation.  P:  Continue amiodarone, ASA, plavix, lopressor, lipitor, imdur Hold outpt norvasc, hydralazine, ranexa for now  RENAL A: Acute kidney injury >> improved. Hx of BPH, prostate cancer, CKD.  P:   Monitor renal fx, urine outpt, electrolytes Hold Proscar, flomax, tasigna for now  GASTROINTESTINAL A: Nutrition. Hx of GERD. P:   Protonix for SUP NPO for bronch  HEMATOLOGIC A: Anemia of chronic disease, critical  illness. Hx of CML. Thrombocytopenia 2nd to sepsis >> resolved. P:  DVT px: SQ heparin F/u CBC intermittently  INFECTIOUS   A: Pneumonia. 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 P:   SSI Hold allopurinol for now  NEUROLOGIC A: Acute metabolic encephalopathy >> resolved. Neuropathy. P:   Monitor mental status Hold lyrica for now   CODE     Code Status Orders        Start     Ordered   12/22/2013 1632  Full code   Continuous     01/04/2014 1644      FAMILY  12/28 - daughter updated at bedside. Explained to patient and daughter toexpect slow recovery with high risk for reintubation   SUMMARY 12/28 - worsening Rt side collapse of  lung. Will try aggressive Chest PT. If fails, intubate. Keep inICU   Dr. Brand Males, M.D., [facility].C.P Pulmonary and Critical Care Medicine Staff Physician [place] [facility] Pager: [phone], If no answer or between  15:00h - 7:00h: call [phone]  01/07/2014 2:27 PM

## 2014-01-07 NOTE — Progress Notes (Signed)
[facility] Progress Note Patient Name: Dale Taylor DOB: [birth year] MRN: [id number]   Date of Service  01/07/2014  HPI/Events of Note    eICU Interventions  Pt more awake, able to take PO, c/o pain Will reorder percocet and follow     Intervention Category Intermediate Interventions: Pain - evaluation and management  BYRUM,ROBERT S. 01/07/2014, 12:54 AM

## 2014-01-07 NOTE — Progress Notes (Signed)
CRITICAL VALUE ALERT  Critical value received:  PCO2 58.0  Date of notification:  01/06/14  Time of notification:  20:40  Critical value read back:Yes.    Nurse who received alert:  BShepherd, RN  MD notified (1st page):  Wert  Time of first page:  20:45  MD notified (2nd page):  Time of second page:  Responding MD:  Melvyn Novas  Time MD responded:  20:50

## 2014-01-07 NOTE — Progress Notes (Signed)
Physical Therapy Treatment Patient Details Name: Dale Taylor MRN: [id number] DOB: [birth year] Today's Date: 01/07/2014    History of Present Illness Dale Taylor is a 73 y.o. male presenting with acute renal failure according to patient's nursing facility. PMH is significant for hypertension, Type 2 diabetes, chronic diastolic heart failure, hyperkalemia, HLD, prostate cancer, iron deficiency anemia, A. fib with RVR. Intubated 12/22, Extubated 12/24.    PT Comments    Pt progressing slowly towards physical therapy goals. More talkative and motivated with therapy today. Min-Mod assist +2 for bed mobility and transfer to chair. While pt sitting on edge of bed, with 15L supplemental O2 and non-rebreather mask, SpO2 dropped down to 79% briefly but returned to low 90s with a rest break and cues for pursed lip breathing. Patient will continue to benefit from skilled physical therapy services to further improve independence with functional mobility.   Follow Up Recommendations  SNF;Supervision/Assistance - 24 hour     Equipment Recommendations  None recommended by PT    Recommendations for Other Services       Precautions / Restrictions Precautions Precautions: Fall Restrictions Weight Bearing Restrictions: No    Mobility  Bed Mobility Overal bed mobility: Needs Assistance;+2 for physical assistance Bed Mobility: Supine to Sit     Supine to sit: Min assist;HOB elevated     General bed mobility comments: Min assist for truncal support into seated position and hand support to scoot forward to edge of bed. Requires extra time and cues for technique.  Transfers Overall transfer level: Needs assistance Equipment used: Rolling walker (2 wheeled) Transfers: Sit to/from [facility] Sit to Stand: +2 physical assistance;From elevated surface;Mod assist Stand pivot transfers: +2 physical assistance;Min assist       General transfer comment: +2 assist for boost to stand  mod assist with cues for hand placement. VC for sequencing during pivot transfer with min assist for walker control. requires some assist for descent into chair.  Ambulation/Gait                 Stairs            Wheelchair Mobility    Modified Rankin (Stroke Patients Only)       Balance       Sitting balance - Comments: Able to sit edge of bed x 4 minutes with intermittent support for balance, using 1 UE for stability at times. SpO2 dropped to 79% while on 15L with non-rebreather mask but improved into low 90s high 80s with cues for pursed lip breathing.                             Cognition Arousal/Alertness: Awake/alert Behavior During Therapy: WFL for tasks assessed/performed Overall Cognitive Status: Impaired/Different from baseline Area of Impairment: Following commands       Following Commands: Follows one step commands consistently;Follows one step commands with increased time            Exercises General Exercises - Lower Extremity Ankle Circles/Pumps: AROM;Both;15 reps;Seated    General Comments        Pertinent Vitals/Pain Pain Assessment: No/denies pain  162/67 BP SpO2 79%(briefly) - 92% on 15L supplemental O2 with non-rebreather mask    Home Living                      Prior Function            PT Goals (current goals  can now be found in the care plan section) Acute Rehab PT Goals PT Goal Formulation: With patient Time For Goal Achievement: 01/14/14 Potential to Achieve Goals: Fair Progress towards PT goals: Progressing toward goals    Frequency  Min 2X/week    PT Plan Current plan remains appropriate    Co-evaluation             End of Session Equipment Utilized During Treatment: Oxygen Activity Tolerance: Patient limited by fatigue Patient left: in chair;with call bell/phone within reach     Time: 1349-1408 PT Time Calculation (min) (ACUTE ONLY): 19 min  Charges:  $Therapeutic Activity:  8-22 mins                    G Codes:      Ellouise Newer 01-23-14, 4:48 PM Camille Bal [place], [place]

## 2014-01-08 ENCOUNTER — Inpatient Hospital Stay (HOSPITAL_COMMUNITY): Payer: Medicare HMO

## 2014-01-08 LAB — BASIC METABOLIC PANEL
Anion gap: 9 (ref 5–15)
BUN: 28 mg/dL — AB (ref 6–23)
CALCIUM: 8.7 mg/dL (ref 8.4–10.5)
CO2: 25 mmol/L (ref 19–32)
Chloride: 107 mEq/L (ref 96–112)
Creatinine, Ser: 1.52 mg/dL — ABNORMAL HIGH (ref 0.50–1.35)
GFR calc Af Amer: 51 mL/min — ABNORMAL LOW (ref >90)
GFR, EST NON AFRICAN AMERICAN: 44 mL/min — AB (ref >90)
GLUCOSE: 103 mg/dL — AB (ref 70–99)
Potassium: 4.7 mmol/L (ref 3.5–5.1)
SODIUM: 141 mmol/L (ref 135–145)

## 2014-01-08 LAB — CBC WITH DIFFERENTIAL/PLATELET
BASOS PCT: 0 % (ref 0–1)
Basophils Absolute: 0 10*3/uL (ref 0.0–0.1)
EOS PCT: 2 % (ref 0–5)
Eosinophils Absolute: 0.1 10*3/uL (ref 0.0–0.7)
HEMATOCRIT: 29.6 % — AB (ref 39.0–52.0)
Hemoglobin: 8.6 g/dL — ABNORMAL LOW (ref 13.0–17.0)
Lymphocytes Relative: 8 % — ABNORMAL LOW (ref 12–46)
Lymphs Abs: 0.6 10*3/uL — ABNORMAL LOW (ref 0.7–4.0)
MCH: 27.4 pg (ref 26.0–34.0)
MCHC: 29.1 g/dL — AB (ref 30.0–36.0)
MCV: 94.3 fL (ref 78.0–100.0)
MONO ABS: 1.2 10*3/uL — AB (ref 0.1–1.0)
Monocytes Relative: 18 % — ABNORMAL HIGH (ref 3–12)
Neutro Abs: 4.9 10*3/uL (ref 1.7–7.7)
Neutrophils Relative %: 72 % (ref 43–77)
Platelets: 323 10*3/uL (ref 150–400)
RBC: 3.14 MIL/uL — ABNORMAL LOW (ref 4.22–5.81)
RDW: 16.2 % — ABNORMAL HIGH (ref 11.5–15.5)
WBC: 6.8 10*3/uL (ref 4.0–10.5)

## 2014-01-08 LAB — POCT I-STAT 3, ART BLOOD GAS (G3+)
ACID-BASE DEFICIT: 3 mmol/L — AB (ref 0.0–2.0)
Bicarbonate: 26.4 mEq/L — ABNORMAL HIGH (ref 20.0–24.0)
O2 SAT: 93 %
PCO2 ART: 71.3 mmHg — AB (ref 35.0–45.0)
Patient temperature: 99
TCO2: 29 mmol/L (ref 0–100)
pH, Arterial: 7.178 — CL (ref 7.350–7.450)
pO2, Arterial: 86 mmHg (ref 80.0–100.0)

## 2014-01-08 LAB — GLUCOSE, CAPILLARY
Glucose-Capillary: 97 mg/dL (ref 70–99)
Glucose-Capillary: 99 mg/dL (ref 70–99)
Glucose-Capillary: 98 mg/dL (ref 70–99)
Glucose-Capillary: 97 mg/dL (ref 70–99)
Glucose-Capillary: 132 mg/dL — ABNORMAL HIGH (ref 70–99)

## 2014-01-08 LAB — PHOSPHORUS: Phosphorus: 3 mg/dL (ref 2.3–4.6)

## 2014-01-08 LAB — MAGNESIUM: Magnesium: 1.8 mg/dL (ref 1.5–2.5)

## 2014-01-08 MED ORDER — MAGNESIUM SULFATE 2 GM/50ML IV SOLN
2.0000 g | Freq: Once | INTRAVENOUS | Status: AC
  Administered 2014-01-08: 2 g via INTRAVENOUS
  Filled 2014-01-08: qty 50

## 2014-01-08 MED ORDER — ROCURONIUM BROMIDE 50 MG/5ML IV SOLN: 95.0000 mg | Freq: Once | INTRAVENOUS | Status: DC

## 2014-01-08 MED ORDER — DEXMEDETOMIDINE HCL IN NACL 200 MCG/50ML IV SOLN
0.0000 ug/kg/h | INTRAVENOUS | Status: AC
  Administered 2014-01-08: 0.3 ug/kg/h via INTRAVENOUS
  Administered 2014-01-08: 0.2 ug/kg/h via INTRAVENOUS
  Administered 2014-01-09: 0.3 ug/kg/h via INTRAVENOUS
  Administered 2014-01-09: 0.1 ug/kg/h via INTRAVENOUS
  Administered 2014-01-10 (×2): 0.3 ug/kg/h via INTRAVENOUS
  Administered 2014-01-10: 0.2 ug/kg/h via INTRAVENOUS
  Administered 2014-01-11: 0.3 ug/kg/h via INTRAVENOUS
  Administered 2014-01-11: 1.2 ug/kg/h via INTRAVENOUS
  Filled 2014-01-08 (×12): qty 50

## 2014-01-08 MED ORDER — PRO-STAT SUGAR FREE PO LIQD
30.0000 mL | Freq: Two times a day (BID) | ORAL | Status: AC
  Administered 2014-01-08 (×2): 30 mL
  Filled 2014-01-08 (×2): qty 30

## 2014-01-08 MED ORDER — FENTANYL CITRATE 0.05 MG/ML IJ SOLN
INTRAMUSCULAR | Status: AC
  Administered 2014-01-08: 100 ug via INTRAVENOUS
  Filled 2014-01-08: qty 2

## 2014-01-08 MED ORDER — FENTANYL CITRATE 0.05 MG/ML IJ SOLN
50.0000 ug | Freq: Once | INTRAMUSCULAR | Status: AC
  Administered 2014-01-08: 50 ug via INTRAVENOUS

## 2014-01-08 MED ORDER — MIDAZOLAM HCL 2 MG/2ML IJ SOLN
INTRAMUSCULAR | Status: AC
  Administered 2014-01-08: 2 mg via INTRAVENOUS
  Filled 2014-01-08: qty 2

## 2014-01-08 MED ORDER — FENTANYL CITRATE 0.05 MG/ML IJ SOLN: 50.0000 ug | INTRAMUSCULAR | Status: DC | PRN

## 2014-01-08 MED ORDER — ETOMIDATE 2 MG/ML IV SOLN
20.0000 mg | Freq: Once | INTRAVENOUS | Status: AC
  Administered 2014-01-08: 20 mg via INTRAVENOUS

## 2014-01-08 MED ORDER — SODIUM CHLORIDE 0.9 % IV SOLN
25.0000 ug/h | INTRAVENOUS | Status: DC
  Administered 2014-01-08: 100 ug/h via INTRAVENOUS
  Administered 2014-01-09: 150 ug/h via INTRAVENOUS
  Filled 2014-01-08 (×3): qty 50

## 2014-01-08 MED ORDER — FENTANYL BOLUS VIA INFUSION
25.0000 ug | INTRAVENOUS | Status: DC | PRN
  Filled 2014-01-08: qty 25

## 2014-01-08 MED ORDER — VITAL HIGH PROTEIN PO LIQD
1000.0000 mL | ORAL | Status: DC
  Administered 2014-01-08 – 2014-01-10 (×3): 1000 mL
  Administered 2014-01-10 (×4)
  Administered 2014-01-10: 1000 mL
  Administered 2014-01-11 (×3)
  Administered 2014-01-11: 1000 mL
  Administered 2014-01-11 (×5)
  Administered 2014-01-12 – 2014-01-16 (×6): 1000 mL
  Administered 2014-01-17 (×4)
  Administered 2014-01-17: 1000 mL
  Administered 2014-01-17 (×2)
  Administered 2014-01-18 – 2014-01-19 (×3): 1000 mL
  Filled 2014-01-08 (×25): qty 1000

## 2014-01-08 MED ORDER — MIDAZOLAM HCL 2 MG/2ML IJ SOLN
2.0000 mg | Freq: Once | INTRAMUSCULAR | Status: AC
  Administered 2014-01-08: 2 mg via INTRAVENOUS

## 2014-01-08 MED ORDER — SODIUM CHLORIDE 0.9 % IV BOLUS (SEPSIS)
500.0000 mL | Freq: Once | INTRAVENOUS | Status: AC
  Administered 2014-01-08: 500 mL via INTRAVENOUS

## 2014-01-08 MED ORDER — CETYLPYRIDINIUM CHLORIDE 0.05 % MT LIQD
7.0000 mL | Freq: Four times a day (QID) | OROMUCOSAL | Status: DC
  Administered 2014-01-09 – 2014-02-04 (×106): 7 mL via OROMUCOSAL

## 2014-01-08 MED ORDER — FENTANYL CITRATE 0.05 MG/ML IJ SOLN
100.0000 ug | Freq: Once | INTRAMUSCULAR | Status: AC
  Administered 2014-01-08: 100 ug via INTRAVENOUS

## 2014-01-08 MED ORDER — FENTANYL CITRATE 0.05 MG/ML IJ SOLN
50.0000 ug | INTRAMUSCULAR | Status: DC | PRN
  Administered 2014-01-08 (×3): 50 ug via INTRAVENOUS
  Filled 2014-01-08 (×3): qty 2

## 2014-01-08 MED ORDER — CHLORHEXIDINE GLUCONATE 0.12 % MT SOLN
15.0000 mL | Freq: Two times a day (BID) | OROMUCOSAL | Status: DC
  Administered 2014-01-09 – 2014-02-04 (×51): 15 mL via OROMUCOSAL
  Filled 2014-01-08 (×53): qty 15

## 2014-01-08 NOTE — Procedures (Signed)
Intubation Procedure Note Eufemio Strahm [id number] 04-16-1940  Procedure: Intubation Indications: Airway protection and maintenance  Procedure Details Consent: Risks of procedure as well as the alternatives and risks of each were explained to the (patient/caregiver).  Consent for procedure obtained. Time Out: Verified patient identification, verified procedure, site/side was marked, verified correct patient position, special equipment/implants available, medications/allergies/relevent history reviewed, required imaging and test results available.  Performed  Maximum sterile technique was used including gloves, gown, hand hygiene and mask.  MAC and 4    Evaluation Hemodynamic Status: BP stable throughout; O2 sats: stable throughout Patient's Current Condition: stable Complications: No apparent complications Patient did tolerate procedure well. Chest X-ray ordered to verify placement.  CXR: pending.   Mcneil Sober 01/08/2014

## 2014-01-08 NOTE — Progress Notes (Signed)
PULMONARY / CRITICAL CARE MEDICINE   Name: Dale Taylor MRN: [id number] DOB: 1940/03/20    ADMISSION DATE:  12/17/2013 CONSULTATION DATE:  12/20  REFERRING MD :  FPTS  CHIEF COMPLAINT:  Respiratory failure    INITIAL PRESENTATION:  73 yo male admitted with A on C renal failure, hyperkalemia, HCAP, UTI.  PCCM assumed care in ICU.   has a past medical history of Hypertension; Hypercholesterolemia; GERD (gastroesophageal reflux disease); Dyspnea on exertion; Claudication in peripheral vascular disease; PVD (peripheral vascular disease); S/P renal artery angioplasty (2009--- W/ STENT X1  TO LEFT COMMON ILIAC  ); ED (erectile dysfunction); History of BPH; Frequency of urination; Urgency of urination; Nocturia; Chronic gouty arthritis (FEET); Diabetes mellitus; Prostate cancer (SCHEDULED FOR RADIOACTIVE SEED IMPLANTS OF PROSTATE  05-18-2011); CML (chronic myelocytic leukemia) (FOLLOWED BY DR Bobby Rumpf ([place])); Iron deficiency anemia; Chronic renal insufficiency; PAD (peripheral artery disease); Cardiovascular disease with arteriosclerosis; Blood transfusion (LAST TIME NOV 2012  --  X2  UNITS  (FOR ANEMIA AND THROMBOCYTOPENIA); Leukocytosis; Peripheral edema; History of thrombocytopenia; Heart murmur; CHF (congestive heart failure) (ADMISSION NOV 2012  [facility])  REQUESTED RECORDS); Hypertension; Arthritis; History of angina; and Acute renal failure (Admitted to [facility] 06/06/12 with hip fracture; creatinine 4.3, K 7.8. Aldactone and lisinopril stopped).   has past surgical history that includes Lung lobectomy (AGE 11 (APPROX.)); Hemorrhoid surgery; transthoracic echocardiogram (12-05-2010  [facility])); Cataract extraction w/ intraocular lens  implant, bilateral (2012); AORTOGRAM/ ANGIOPLASTY AND STENTING OF LEFT COMMON ILIAC ARTERY (04-07-2007   DR FIELDS); Cardiovascular stress test (05-19-2011  DR Jenne Campus); Radioactive seed implant (07/21/2011); and Cystoscopy  (07/21/2011).   STUDIES:  12/21 TTE >> EF 40 to 16%, grade 1 diastolic dysfx 07/37 Renal US >> negative 12/21 PAF with RVR. Amiodarone started. Converted to NSR. Cards consult obtained. Recommended continuing current rx. No anticoagulation given high risk 12/22 Increased dyspnea. White out of R hemithorax on CXR. Intubated. Copious purulent secretions from ETT.  12/22 CT chest >> extensive pleural thickening on R. R lung with reduced volume. Bilateral R>L AS dz 12/23 Copious purulent ET secretion. R lung better aerated. Cr improved 12/24 extubated 12/25 Add chest PT 12/27 : Feels more short of breath.s/p bronch -  Has diffuse tracheobronchomalacia and some yellow clear secretion on right side 01/07/14: Sitting on chair with face mask. Has progressive right lung collapse. Needing high flow o2   SUBJECTIVE/OVERNIGHT/INTERVAL HX 01/08/14: increased work of breathing -high o2 need. Meeting intubation criteria. Declined retintubation to RN over concerns of comfort. Agreed to reintubation by MD   VITAL SIGNS: Temp:  [98 F (36.7 C)-98.8 F (37.1 C)] 98.8 F (37.1 C) (12/29 0822) Pulse Rate:  [74-91] 80 (12/29 0835) Resp:  [13-29] 13 (12/29 0835) BP: (134-165)/(53-122) 153/59 mmHg (12/29 0835) SpO2:  [86 %-97 %] 93 % (12/29 0835) FiO2 (%):  [60 %-100 %] 100 % (12/29 0835) Weight:  [95.9 kg (211 lb 6.7 oz)] 95.9 kg (211 lb 6.7 oz) (12/29 0400) INTAKE / OUTPUT:  Intake/Output Summary (Last 24 hours) at 01/08/14 0926 Last data filed at 01/08/14 0800  Gross per 24 hour  Intake    805 ml  Output    805 ml  Net      0 ml    PHYSICAL EXAMINATION: General: in bed. Looks worse Neuro: follows commands, normal strength,  Psych: fearful of intubation HEENT: no sinus tenderness Cardiovascular: regular Lungs: decreased BS on Rt, increased wob, paradoxical Abdomen:  Obese, soft, +bs, suprapubic catheter intact  Ext:  1+ edema   LABS: PULMONARY  Recent Labs Lab 01/06/14 2033  PHART  7.287*  PCO2ART 58.0*  PO2ART 63.0*  HCO3 27.7*  TCO2 29  O2SAT 88.0    CBC  Recent Labs Lab 01/05/14 0245 01/07/14 0400 01/08/14 0415  HGB 8.5* 8.6* 8.6*  HCT 27.7* 28.9* 29.6*  WBC 4.6 5.7 6.8  PLT 213 261 323    COAGULATION No results for input(s): INR in the last 168 hours.  CARDIAC  No results for input(s): TROPONINI in the last 168 hours. No results for input(s): PROBNP in the last 168 hours.   CHEMISTRY  Recent Labs Lab 01/04/14 0312 01/05/14 0245 01/06/14 0355 01/07/14 0400 01/08/14 0415  NA 146* 147* 143 144 141  K 3.5 3.8 4.1 4.3 4.7  CL 111 110 108 109 107  CO2 31 31 29 30 25   GLUCOSE 120* 110* 118* 93 103*  BUN 35* 26* 22 21 28*  CREATININE 1.46* 1.39* 1.26 1.28 1.52*  CALCIUM 8.3* 8.5 8.5 8.6 8.7  MG  --   --   --   --  1.8  PHOS  --   --   --   --  3.0   Estimated Creatinine Clearance: 51.1 mL/min (by C-G formula based on Cr of 1.52).   LIVER  Recent Labs Lab 01/02/14 0500  AST 22  ALT 28  ALKPHOS 76  BILITOT 0.5  PROT 4.8*  ALBUMIN 2.4*     INFECTIOUS  Recent Labs Lab 01/03/14 0500  PROCALCITON 0.26     ENDOCRINE CBG (last 3)   Recent Labs  01/07/14 1539 01/07/14 2200 01/08/14 0820  GLUCAP 80 98 97         IMAGING x48h Dg Chest Port 1 View  01/07/2014   CLINICAL DATA:  Mucous plugging.  EXAM: PORTABLE CHEST - 1 VIEW  COMPARISON:  01/06/2014.  FINDINGS: Left IJ line in stable position. Postsurgical changes right hilum. Progressive atelectasis right lung. Right pleural effusion. Diffuse mild infiltrate left lung. Heart size is difficult to evaluate due to atelectasis of the right lung and shift of the heart mediastinum to the right. No acute bony abnormality.  IMPRESSION: 1. Left IJ line in stable position. 2. Progressive atelectasis of the right lung. Persistent right pleural effusion. 3. Diffuse left lung pulmonary infiltrate.   Electronically Signed   By: Marcello Moores  Register   On: 01/07/2014 07:24         ASSESSMENT / PLAN:  PULMONARY ETT 12/22 >> 12/24 A: Acute on chronic hypercarbic respiratory failure 2nd to PNA. Chronic R pleural thickening of unclear etiology. R lung collapse. S/p bronch 12/27 - diffuse tracheobronchomalacia  01/08/14 - progressive Rt lung collapse wit worsening work of breathing.   P:   Intubate 01/08/14 and post intubation might need repeat bronch 01/09/14 Oxygen to keep SpO2 > 92% F/u CXR Continue bronchial hygiene; step up to vest and percussor Continue mucomyst  CARDIOVASCULAR Lt IJ CVL 12/22 >> A: Hx of CAD, HTN, HLD, chronic combined CHF, PVD. PAF > in sinus rhythm >> Not considered candidate for chronic anticoagulation.  12/29 - NSR with 86 and MAP 62  P:  Continue amiodarone, ASA, plavix, lopressor, lipitor, imdur Hold outpt norvasc, hydralazine, ranexa for now  RENAL A: Acute kidney injury >> improved. Hx of BPH, prostate cancer, CKD.  01/08/14 - mild low mag  P:   Replete mag Monitor renal fx, urine outpt, electrolytes Hold Proscar, flomax, tasigna for now  GASTROINTESTINAL A: Nutrition. Hx  of GERD. P:   Protonix for SUP NPO for bronch  HEMATOLOGIC A: Anemia of chronic disease, critical illness. Hx of CML. Thrombocytopenia 2nd to sepsis >> resolved. P:  DVT px: SQ heparin F/u CBC intermittently: PRBC for hgb < 7gm%  INFECTIOUS   A: Pneumonia. 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 P:   SSI Hold allopurinol for now  NEUROLOGIC A: Acute metabolic encephalopathy >> resolved. Neuropathy.   - Will need sedation post reintubation 01/08/14   P:   PAD protocol #1 - fent prn for Rass goal 0 to -2 post intubation Monitor mental status Hold lyrica for now   CODE     Code Status Orders        Start     Ordered   12/18/2013 1632  Full code    Continuous     12/27/2013 1644      FAMILY  12/28 - daughter updated at bedside. Explained to patient and daughter toexpect slow recovery with high risk for reintubation 01/08/14 - daughter (non dpoa) Angelus Hoopes updated over phone  SUMMARY 12/29 - worsening Rt side collapse of lung.  Reintubate    The patient is critically ill with multiple organ systems failure and requires high complexity decision making for assessment and support, frequent evaluation and titration of therapies, application of advanced monitoring technologies and extensive interpretation of multiple databases.   Critical Care Time devoted to patient care services described in this note is  40  Minutes. This time reflects time of care of this signee Dr Brand Males. This critical care time does not reflect procedure time, or teaching time or supervisory time of PA/NP/Med student/Med Resident etc but could involve care discussion time    Dr. Brand Males, M.D., [facility].C.P Pulmonary and Critical Care Medicine Staff Physician [place] [facility] Pager: [phone], If no answer or between  15:00h - 7:00h: call [phone]  01/08/2014 9:43 AM

## 2014-01-08 NOTE — Progress Notes (Signed)
FMTS Social Note  Pt is a 73 y.o M who presents with acute on chronic renal failure, hyperkalemia and HCAP causing resp failure requiring intubation. Was extubated on 12/24 but still has continued SOB. Yesterday pt noted to have progressive right lung collapse. Attempted aggresive chest PT and BiPAP yesterday through this morning. CXR this morning with no signs of improvement. Pt very drowsy and confused. Re-intubated.   We appreciate the care provided to Dale Taylor by the CCM service. Once he is stable to be transferred to the floor we will gladly accept him onto our service.   Dale Bell, MD [place] Resident PGY-2 01/08/2014 8:53 AM

## 2014-01-08 NOTE — Progress Notes (Signed)
NUTRITION FOLLOW UP  Intervention:    Initiate TF via OGT with Vital AF 1.2 at 25 ml/h and Prostat 30 ml BID on day 1; on day 2, d/c Prostat and increase to goal rate of 65 ml/h (1560 ml per day) to provide 1872 kcals, 117 gm protein, 1265 ml free water daily.  Nutrition Dx:   Inadequate oral intake related to inability to eat as evidenced by NPO status, ongoing.  New Goal:   Intake to meet >90% of estimated nutrition needs. Unmet.  Monitor:   TF tolerance/adequacy, weight trend, labs, respiratory status.  Assessment:   73yo male with extensive PMH including hx CML, prostate ca, HTN, chronic dCHF, chronic suprapubic catheter, DM and CKD presented 12/20 from SNF with concern for "kidney problems". Admitted by [facility] teaching service with acute on chronic renal failure with hyperkalemia, HCAP +/- worsening R effusion and UTI. PCCM consulted r/t respiratory acidosis and increasing somnolence.   Patient was extubated on 12/24. TF off since extubation. Requiring re-intubation this AM. Received MD Consult for TF initiation and management.  Height: Ht Readings from Last 1 Encounters:  12/12/2013 5\' 11"  (1.803 m)    Weight Status:   Wt Readings from Last 1 Encounters:  01/08/14 211 lb 6.7 oz (95.9 kg)   01/02/14 223 lb 12.3 oz (101.5 kg)   Body mass index is 29.5 kg/(m^2).  Ideal Body Weight: 78.2 kg  Re-estimated needs:  Kcal: 1935 Protein: 115-125 gm Fluid: 2 L  Skin: skin tear to buttocks  Diet Order: Diet NPO time specified   Intake/Output Summary (Last 24 hours) at 01/08/14 1054 Last data filed at 01/08/14 0900  Gross per 24 hour  Intake    755 ml  Output    785 ml  Net    -30 ml    Last BM: 12/24   Labs:   Recent Labs Lab 01/06/14 0355 01/07/14 0400 01/08/14 0415  NA 143 144 141  K 4.1 4.3 4.7  CL 108 109 107  CO2 29 30 25   BUN 22 21 28*  CREATININE 1.26 1.28 1.52*  CALCIUM 8.5 8.6 8.7  MG  --   --  1.8  PHOS  --   --  3.0  GLUCOSE 118* 93 103*     CBG (last 3)   Recent Labs  01/07/14 1539 01/07/14 2200 01/08/14 0820  GLUCAP 80 98 97    Scheduled Meds: . albuterol  2.5 mg Nebulization Q6H  . antiseptic oral rinse  7 mL Mouth Rinse q12n4p  . chlorhexidine  15 mL Mouth Rinse BID  . etomidate  20 mg Intravenous Once  . fentaNYL      . fentaNYL  100 mcg Intravenous Once  . heparin  5,000 Units Subcutaneous 3 times per day  . insulin aspart  0-15 Units Subcutaneous TID WC  . metoprolol  5 mg Intravenous 4 times per day  . midazolam      . midazolam  2 mg Intravenous Once  . nitroGLYCERIN  1 inch Topical 4 times per day  . pantoprazole (PROTONIX) IV  40 mg Intravenous Q24H  . rocuronium  95 mg Intravenous Once    Continuous Infusions:    Molli Barrows, RD, LDN, [place] Pager [phone] After Hours Pager [phone]

## 2014-01-08 NOTE — Procedures (Signed)
Intubation Procedure Note Wael Maestas [id number] 27-Nov-1940  Procedure: Intubation Indications: Respiratory insufficiency  Procedure Details Consent: Risks of procedure as well as the alternatives and risks of each were explained to the (patient/caregiver).  Consent for procedure obtained. Time Out: Verified patient identification, verified procedure, site/side was marked, verified correct patient position, special equipment/implants available, medications/allergies/relevent history reviewed, required imaging and test results available.  Performed  Maximum sterile technique was used including cap, gloves, gown, hand hygiene and mask.  MAC  glidescope used - post intubation significant colored resp secretion +   Evaluation Hemodynamic Status: BP stable throughout; O2 sats: transiently fell during during procedure Patient's Current Condition: stable Complications: No apparent complications Patient did tolerate procedure well. Chest X-ray ordered to verify placement.  CXR: pending.   Pihu Basil 01/08/2014  ,Dr. Brand Males, M.D., [facility].C.P Pulmonary and Critical Care Medicine Staff Physician [place] [facility] Pager: [phone], If no answer or between  15:00h - 7:00h: call [phone]  01/08/2014 11:10 AM

## 2014-01-09 ENCOUNTER — Inpatient Hospital Stay (HOSPITAL_COMMUNITY): Payer: Medicare HMO

## 2014-01-09 LAB — BASIC METABOLIC PANEL
ANION GAP: 4 — AB (ref 5–15)
BUN: 37 mg/dL — ABNORMAL HIGH (ref 6–23)
CO2: 29 mmol/L (ref 19–32)
Calcium: 8.4 mg/dL (ref 8.4–10.5)
Chloride: 110 mEq/L (ref 96–112)
Creatinine, Ser: 1.75 mg/dL — ABNORMAL HIGH (ref 0.50–1.35)
GFR calc non Af Amer: 37 mL/min — ABNORMAL LOW (ref >90)
GFR, EST AFRICAN AMERICAN: 43 mL/min — AB (ref >90)
Glucose, Bld: 141 mg/dL — ABNORMAL HIGH (ref 70–99)
POTASSIUM: 4.3 mmol/L (ref 3.5–5.1)
SODIUM: 143 mmol/L (ref 135–145)

## 2014-01-09 LAB — GLUCOSE, CAPILLARY
GLUCOSE-CAPILLARY: 147 mg/dL — AB (ref 70–99)
Glucose-Capillary: 126 mg/dL — ABNORMAL HIGH (ref 70–99)
Glucose-Capillary: 132 mg/dL — ABNORMAL HIGH (ref 70–99)
Glucose-Capillary: 149 mg/dL — ABNORMAL HIGH (ref 70–99)
Glucose-Capillary: 103 mg/dL — ABNORMAL HIGH (ref 70–99)

## 2014-01-09 LAB — CBC WITH DIFFERENTIAL/PLATELET
Basophils Absolute: 0 10*3/uL (ref 0.0–0.1)
Basophils Relative: 0 % (ref 0–1)
Eosinophils Absolute: 0.3 10*3/uL (ref 0.0–0.7)
Eosinophils Relative: 4 % (ref 0–5)
HEMATOCRIT: 26.2 % — AB (ref 39.0–52.0)
HEMOGLOBIN: 8.1 g/dL — AB (ref 13.0–17.0)
LYMPHS ABS: 1 10*3/uL (ref 0.7–4.0)
LYMPHS PCT: 16 % (ref 12–46)
MCH: 28.8 pg (ref 26.0–34.0)
MCHC: 30.9 g/dL (ref 30.0–36.0)
MCV: 93.2 fL (ref 78.0–100.0)
Monocytes Absolute: 1 10*3/uL (ref 0.1–1.0)
Monocytes Relative: 17 % — ABNORMAL HIGH (ref 3–12)
NEUTROS ABS: 4 10*3/uL (ref 1.7–7.7)
NEUTROS PCT: 63 % (ref 43–77)
Platelets: 347 10*3/uL (ref 150–400)
RBC: 2.81 MIL/uL — ABNORMAL LOW (ref 4.22–5.81)
RDW: 16 % — ABNORMAL HIGH (ref 11.5–15.5)
WBC: 6.3 10*3/uL (ref 4.0–10.5)

## 2014-01-09 LAB — PHOSPHORUS: Phosphorus: 2.3 mg/dL (ref 2.3–4.6)

## 2014-01-09 LAB — MAGNESIUM: MAGNESIUM: 2.2 mg/dL (ref 1.5–2.5)

## 2014-01-09 MED ORDER — WHITE PETROLATUM GEL
Status: AC
  Administered 2014-01-09: 0.2
  Filled 2014-01-09: qty 5

## 2014-01-09 MED ORDER — FUROSEMIDE 10 MG/ML IJ SOLN
40.0000 mg | Freq: Three times a day (TID) | INTRAMUSCULAR | Status: DC
  Administered 2014-01-09 – 2014-01-13 (×14): 40 mg via INTRAVENOUS
  Filled 2014-01-09 (×19): qty 4

## 2014-01-09 MED ORDER — FENTANYL CITRATE 0.05 MG/ML IJ SOLN
50.0000 ug | INTRAMUSCULAR | Status: DC | PRN
  Administered 2014-01-09: 50 ug via INTRAVENOUS
  Administered 2014-01-09: 100 ug via INTRAVENOUS
  Administered 2014-01-09 – 2014-01-10 (×3): 50 ug via INTRAVENOUS
  Administered 2014-01-11 (×2): 100 ug via INTRAVENOUS
  Administered 2014-01-11: 50 ug via INTRAVENOUS
  Administered 2014-01-12 – 2014-01-13 (×5): 200 ug via INTRAVENOUS
  Administered 2014-01-14 (×2): 100 ug via INTRAVENOUS
  Administered 2014-01-14 (×2): 50 ug via INTRAVENOUS
  Administered 2014-01-14 – 2014-01-16 (×14): 100 ug via INTRAVENOUS
  Filled 2014-01-09: qty 2
  Filled 2014-01-09: qty 4
  Filled 2014-01-09 (×5): qty 2
  Filled 2014-01-09: qty 4
  Filled 2014-01-09 (×8): qty 2
  Filled 2014-01-09 (×2): qty 4
  Filled 2014-01-09 (×2): qty 2
  Filled 2014-01-09: qty 4
  Filled 2014-01-09 (×6): qty 2
  Filled 2014-01-09: qty 4
  Filled 2014-01-09: qty 2

## 2014-01-09 MED ORDER — INSULIN ASPART 100 UNIT/ML ~~LOC~~ SOLN
0.0000 [IU] | SUBCUTANEOUS | Status: DC
  Administered 2014-01-09 – 2014-01-10 (×2): 2 [IU] via SUBCUTANEOUS
  Administered 2014-01-10: 3 [IU] via SUBCUTANEOUS
  Administered 2014-01-10: 2 [IU] via SUBCUTANEOUS
  Administered 2014-01-10: 3 [IU] via SUBCUTANEOUS
  Administered 2014-01-10 – 2014-01-11 (×2): 2 [IU] via SUBCUTANEOUS
  Administered 2014-01-11: 5 [IU] via SUBCUTANEOUS
  Administered 2014-01-11 (×2): 3 [IU] via SUBCUTANEOUS
  Administered 2014-01-11: 2 [IU] via SUBCUTANEOUS
  Administered 2014-01-11 – 2014-01-12 (×2): 5 [IU] via SUBCUTANEOUS
  Administered 2014-01-12: 2 [IU] via SUBCUTANEOUS
  Administered 2014-01-12 – 2014-01-13 (×3): 3 [IU] via SUBCUTANEOUS
  Administered 2014-01-13 (×2): 2 [IU] via SUBCUTANEOUS
  Administered 2014-01-13: 3 [IU] via SUBCUTANEOUS
  Administered 2014-01-13: 2 [IU] via SUBCUTANEOUS
  Administered 2014-01-13 – 2014-01-14 (×4): 3 [IU] via SUBCUTANEOUS
  Administered 2014-01-14: 5 [IU] via SUBCUTANEOUS
  Administered 2014-01-14 – 2014-01-15 (×2): 2 [IU] via SUBCUTANEOUS
  Administered 2014-01-15: 3 [IU] via SUBCUTANEOUS
  Administered 2014-01-15: 2 [IU] via SUBCUTANEOUS
  Administered 2014-01-15: 3 [IU] via SUBCUTANEOUS
  Administered 2014-01-15 – 2014-01-16 (×3): 2 [IU] via SUBCUTANEOUS
  Administered 2014-01-16: 5 [IU] via SUBCUTANEOUS
  Administered 2014-01-16 (×4): 2 [IU] via SUBCUTANEOUS
  Administered 2014-01-17 (×3): 3 [IU] via SUBCUTANEOUS
  Administered 2014-01-17: 5 [IU] via SUBCUTANEOUS
  Administered 2014-01-17 – 2014-01-18 (×7): 3 [IU] via SUBCUTANEOUS
  Administered 2014-01-18 – 2014-01-19 (×2): 2 [IU] via SUBCUTANEOUS
  Administered 2014-01-19: 3 [IU] via SUBCUTANEOUS
  Administered 2014-01-19: 2 [IU] via SUBCUTANEOUS
  Administered 2014-01-19: 3 [IU] via SUBCUTANEOUS
  Administered 2014-01-19 – 2014-01-20 (×4): 2 [IU] via SUBCUTANEOUS
  Administered 2014-01-22 – 2014-01-24 (×7): 3 [IU] via SUBCUTANEOUS
  Administered 2014-01-24 (×2): 2 [IU] via SUBCUTANEOUS
  Administered 2014-01-24: 3 [IU] via SUBCUTANEOUS
  Administered 2014-01-24 – 2014-01-25 (×5): 2 [IU] via SUBCUTANEOUS
  Administered 2014-01-25: 3 [IU] via SUBCUTANEOUS
  Administered 2014-01-25: 2 [IU] via SUBCUTANEOUS
  Administered 2014-01-25 – 2014-01-26 (×3): 3 [IU] via SUBCUTANEOUS
  Administered 2014-01-26: 2 [IU] via SUBCUTANEOUS
  Administered 2014-01-26: 3 [IU] via SUBCUTANEOUS
  Administered 2014-01-26: 2 [IU] via SUBCUTANEOUS
  Administered 2014-01-27: 3 [IU] via SUBCUTANEOUS
  Administered 2014-01-27 – 2014-01-29 (×12): 2 [IU] via SUBCUTANEOUS
  Administered 2014-01-29: 3 [IU] via SUBCUTANEOUS
  Administered 2014-01-29: 2 [IU] via SUBCUTANEOUS
  Administered 2014-01-30: 3 [IU] via SUBCUTANEOUS
  Administered 2014-01-30 (×2): 2 [IU] via SUBCUTANEOUS
  Administered 2014-01-31 (×5): 3 [IU] via SUBCUTANEOUS
  Administered 2014-02-01 (×2): 2 [IU] via SUBCUTANEOUS
  Administered 2014-02-01: 3 [IU] via SUBCUTANEOUS
  Administered 2014-02-01: 2 [IU] via SUBCUTANEOUS
  Administered 2014-02-01: 5 [IU] via SUBCUTANEOUS
  Administered 2014-02-02: 2 [IU] via SUBCUTANEOUS
  Administered 2014-02-02: 3 [IU] via SUBCUTANEOUS
  Administered 2014-02-02 (×2): 2 [IU] via SUBCUTANEOUS
  Administered 2014-02-02 – 2014-02-03 (×2): 3 [IU] via SUBCUTANEOUS
  Administered 2014-02-03: 2 [IU] via SUBCUTANEOUS
  Administered 2014-02-03: 3 [IU] via SUBCUTANEOUS
  Administered 2014-02-04 (×2): 2 [IU] via SUBCUTANEOUS

## 2014-01-09 MED ORDER — POTASSIUM CHLORIDE 20 MEQ/15ML (10%) PO SOLN
20.0000 meq | Freq: Three times a day (TID) | ORAL | Status: DC
  Administered 2014-01-09 – 2014-01-13 (×14): 20 meq via ORAL
  Filled 2014-01-09 (×19): qty 15

## 2014-01-09 MED ORDER — DEXTROSE 5 % IV SOLN
30.0000 mmol | Freq: Once | INTRAVENOUS | Status: AC
  Administered 2014-01-09: 30 mmol via INTRAVENOUS
  Filled 2014-01-09: qty 10

## 2014-01-09 MED ORDER — METOPROLOL TARTRATE 25 MG/10 ML ORAL SUSPENSION
25.0000 mg | Freq: Two times a day (BID) | ORAL | Status: DC
  Administered 2014-01-10 – 2014-01-13 (×7): 25 mg
  Filled 2014-01-09 (×10): qty 10

## 2014-01-09 MED ORDER — PANTOPRAZOLE SODIUM 40 MG PO PACK
40.0000 mg | PACK | Freq: Every day | ORAL | Status: DC
  Administered 2014-01-09 – 2014-01-17 (×9): 40 mg
  Filled 2014-01-09 (×12): qty 20

## 2014-01-09 NOTE — Progress Notes (Signed)
[facility] Progress Note Patient Name: Dale Taylor DOB: [birth year] MRN: [id number]   Date of Service  01/09/2014  HPI/Events of Note  hypophosphatemia  eICU Interventions  Phos replaced     Intervention Category Major Interventions: Electrolyte abnormality - evaluation and management  DETERDING,ELIZABETH 01/09/2014, 5:28 AM

## 2014-01-09 NOTE — Progress Notes (Signed)
PULMONARY / CRITICAL CARE MEDICINE   Name: Dale Taylor MRN: [id number] DOB: 10/29/1940    ADMISSION DATE:  12/17/2013 CONSULTATION DATE:  12/20  REFERRING MD :  FPTS  CHIEF COMPLAINT:  Respiratory failure    INITIAL PRESENTATION:  73 yo male admitted with A on C renal failure, hyperkalemia, HCAP, UTI.  PCCM assumed care in ICU.   has a past medical history of Hypertension; Hypercholesterolemia; GERD (gastroesophageal reflux disease); Dyspnea on exertion; Claudication in peripheral vascular disease; PVD (peripheral vascular disease); S/P renal artery angioplasty (2009--- W/ STENT X1  TO LEFT COMMON ILIAC  ); ED (erectile dysfunction); History of BPH; Frequency of urination; Urgency of urination; Nocturia; Chronic gouty arthritis (FEET); Diabetes mellitus; Prostate cancer (SCHEDULED FOR RADIOACTIVE SEED IMPLANTS OF PROSTATE  05-18-2011); CML (chronic myelocytic leukemia) (FOLLOWED BY DR Bobby Rumpf ([place])); Iron deficiency anemia; Chronic renal insufficiency; PAD (peripheral artery disease); Cardiovascular disease with arteriosclerosis; Blood transfusion (LAST TIME NOV 2012  --  X2  UNITS  (FOR ANEMIA AND THROMBOCYTOPENIA); Leukocytosis; Peripheral edema; History of thrombocytopenia; Heart murmur; CHF (congestive heart failure) (ADMISSION NOV 2012  [facility])  REQUESTED RECORDS); Hypertension; Arthritis; History of angina; and Acute renal failure (Admitted to [facility] 06/06/12 with hip fracture; creatinine 4.3, K 7.8. Aldactone and lisinopril stopped).   has past surgical history that includes Lung lobectomy (AGE 31 (APPROX.)); Hemorrhoid surgery; transthoracic echocardiogram (12-05-2010  [facility])); Cataract extraction w/ intraocular lens  implant, bilateral (2012); AORTOGRAM/ ANGIOPLASTY AND STENTING OF LEFT COMMON ILIAC ARTERY (04-07-2007   DR FIELDS); Cardiovascular stress test (05-19-2011  DR Jenne Campus); Radioactive seed implant (07/21/2011); and Cystoscopy  (07/21/2011).   STUDIES:  12/21 TTE >> EF 40 to 16%, grade 1 diastolic dysfx 01/09 Renal US >> negative 12/21 PAF with RVR. Amiodarone started. Converted to NSR. Cards consult obtained. Recommended continuing current rx. No anticoagulation given high risk 12/22 Increased dyspnea. White out of R hemithorax on CXR. Intubated. Copious purulent secretions from ETT.  12/22 CT chest >> extensive pleural thickening on R. R lung with reduced volume. Bilateral R>L AS dz 12/23 Copious purulent ET secretion. R lung better aerated. Cr improved 12/24 extubated 12/25 Add chest PT 12/27 : Feels more short of breath.s/p bronch -  Has diffuse tracheobronchomalacia and some yellow clear secretion on right side 01/07/14: Sitting on chair with face mask. Has progressive right lung collapse. Needing high flow o2 01/08/14: increased work of breathing -high o2 need. Persistent and worsening Rt lung collapse and REINTUBATED   SUBJECTIVE/OVERNIGHT/INTERVAL HX 01/09/14: Post intubation righ lung opened up. On 40% fio2. WUA in progress - on fent gtt + precedex gtt   VITAL SIGNS: Temp:  [98.7 F (37.1 C)-100.6 F (38.1 C)] 98.8 F (37.1 C) (12/30 0736) Pulse Rate:  [59-81] 60 (12/30 1136) Resp:  [0-26] 24 (12/30 1136) BP: (102-157)/(41-72) 127/53 mmHg (12/30 1136) SpO2:  [81 %-100 %] 99 % (12/30 1136) FiO2 (%):  [40 %-100 %] 40 % (12/30 1136) Weight:  [95.1 kg (209 lb 10.5 oz)] 95.1 kg (209 lb 10.5 oz) (12/30 0400) INTAKE / OUTPUT:  Intake/Output Summary (Last 24 hours) at 01/09/14 1148 Last data filed at 01/09/14 1000  Gross per 24 hour  Intake 1575.15 ml  Output    580 ml  Net 995.15 ml    PHYSICAL EXAMINATION: General: in bed.Intubated.  Neuro:RASS -2 on fent gtt and precedex gtt HEENT: no sinus tenderness Cardiovascular: regular Lungs: decreased BS on Rt, increased wob, paradoxical Abdomen:  Obese, soft, +bs, suprapubic catheter intact  Ext:  1+ edema   LABS: PULMONARY  Recent Labs Lab  01/06/14 2033 01/08/14 1306  PHART 7.287* 7.178*  PCO2ART 58.0* 71.3*  PO2ART 63.0* 86.0  HCO3 27.7* 26.4*  TCO2 29 29  O2SAT 88.0 93.0    CBC  Recent Labs Lab 01/07/14 0400 01/08/14 0415 01/09/14 0400  HGB 8.6* 8.6* 8.1*  HCT 28.9* 29.6* 26.2*  WBC 5.7 6.8 6.3  PLT 261 323 347    COAGULATION No results for input(s): INR in the last 168 hours.  CARDIAC  No results for input(s): TROPONINI in the last 168 hours. No results for input(s): PROBNP in the last 168 hours.   CHEMISTRY  Recent Labs Lab 01/05/14 0245 01/06/14 0355 01/07/14 0400 01/08/14 0415 01/09/14 0400  NA 147* 143 144 141 143  K 3.8 4.1 4.3 4.7 4.3  CL 110 108 109 107 110  CO2 31 29 30 25 29   GLUCOSE 110* 118* 93 103* 141*  BUN 26* 22 21 28* 37*  CREATININE 1.39* 1.26 1.28 1.52* 1.75*  CALCIUM 8.5 8.5 8.6 8.7 8.4  MG  --   --   --  1.8 2.2  PHOS  --   --   --  3.0 <1.0*   Estimated Creatinine Clearance: 44.2 mL/min (by C-G formula based on Cr of 1.75).   LIVER No results for input(s): AST, ALT, ALKPHOS, BILITOT, PROT, ALBUMIN, INR in the last 168 hours.   INFECTIOUS  Recent Labs Lab 01/03/14 0500  PROCALCITON 0.26     ENDOCRINE CBG (last 3)   Recent Labs  01/08/14 2322 01/09/14 0349 01/09/14 0733  GLUCAP 132* 126* 132*         IMAGING x48h Dg Chest Port 1 View  01/09/2014   CLINICAL DATA:  Respiratory failure.  EXAM: PORTABLE CHEST - 1 VIEW  COMPARISON:  01/08/2014.  FINDINGS: Endotracheal tube, NG tube, left IJ line in stable position. Surgical sutures noted over the right upper lung. Persistent diffuse left lung infiltrate. Right base atelectasis versus infiltrate. Small right pleural effusion. No pneumothorax.  IMPRESSION: 1. Lines and tubes in stable position. 2. Persistent diffuse left lung infiltrate. 3. Right base atelectasis and/or infiltrate. 4. Small right pleural effusion. 5. Surgical sutures right upper lung.   Electronically Signed   By: Marcello Moores  Register    On: 01/09/2014 07:46   Dg Chest Port 1 View  01/08/2014   CLINICAL DATA:  Hypoxia  EXAM: PORTABLE CHEST - 1 VIEW  COMPARISON:  Study obtained earlier in the day  FINDINGS: Endotracheal tube tip is 2.4 cm above the carina. Central catheter tip is at the junction of the left innominate vein and superior vena cava. Nasogastric tube tip and side port extend below the diaphragm. No pneumothorax. There is considerably less effusion on the right compared to earlier in the day. There is patchy infiltrate throughout the left lung fairly diffuse, stable. There is consolidation in the medial right base. Heart is upper normal in size with pulmonary vascular within normal limits. There is postoperative change on the right with volume loss.  IMPRESSION: Tube and catheter positions as described without pneumothorax. Widespread infiltrate on the left, stable. Considerably less opacity on the right compared to earlier in the day. Volume loss with right base consolidation present. No change in cardiac silhouette.   Electronically Signed   By: Lowella Grip M.D.   On: 01/08/2014 11:39   Dg Chest Port 1 View  01/08/2014   CLINICAL DATA:  Respiratory failure.  EXAM: PORTABLE  CHEST - 1 VIEW  COMPARISON:  12/24/2013.  FINDINGS: Left IJ line in stable position. Postsurgical changes right hilum. Persistent atelectasis at the right lung. Right pleural effusion. Progressive diffuse infiltrate left lung. Heart size normal. No pneumothorax. No acute osseus abnormality.  IMPRESSION: 1. Left IJ line stable position. 2. Postsurgical changes right lung. 3. Persistent atelectasis of the right lung and persistent right pleural effusion. 4. Persistent diffuse infiltrate in the left lung with progression from prior exam.   Electronically Signed   By: [place]   On: 01/08/2014 07:45   Dg Abd Portable 1v  01/08/2014   CLINICAL DATA:  73 year old NG tube placement.  EXAM: PORTABLE ABDOMEN - 1 VIEW  COMPARISON:  01/01/2014   FINDINGS: An endotracheal tube tip is approximately 1.5 cm above the carina.  An enteric tube tip is seen within the distal stomach. Side port is within the region of the gastric body.  The partially visualized chest demonstrates sequela of prior surgery. The cardiac silhouette and mediastinal contours are unchanged. Patchy areas of consolidation are noted bilaterally. Right pleural thickening is also unchanged.  IMPRESSION: 1. Enteric tube tip projects over the distal stomach. 2. Endotracheal tube tip is approximately 1.5 cm above the carina. 3. Partially visualized chest demonstrates patchy areas of consolidation, question multifocal pneumonia. 4. Small right pleural effusion.   Electronically Signed   By: Rosemarie Ax   On: 01/08/2014 16:41        ASSESSMENT / PLAN:  PULMONARY ETT 12/22 >> 12/24, 01/08/14  A: Acute on chronic hypercarbic respiratory failure 2nd to PNA.  Intubated 12/22 - 12/24 and reintubated 01/08/14 Chronic R pleural thickening of unclear etiology. R lung collapse. S/p bronch 12/27 - diffuse tracheobronchomalacia  01/09/14 - Rt Lung opeend up aftr reintubation. WUA in progress  P:   WUA as tolerated No extubation 01/09/2014 Likely needs trach Oxygen to keep SpO2 > 92% F/u CXR Continue bronchial hygiene; step up to vest and percussor Dc  mucomyst  CARDIOVASCULAR Lt IJ CVL 12/22 >> A: Hx of CAD, HTN, HLD, chronic combined CHF, PVD. PAF > in sinus rhythm >> Not considered candidate for chronic anticoagulation.  12/30 - NSR with HR 60 and MAP 71. Appears to have 3rd spacing  P:  Continue amiodarone, ASA, plavix, lopressor, lipitor, imdur Hold outpt norvasc, hydralazine, ranexa for now Start lasix with kcl  RENAL A: Acute kidney injury >> improved. Hx of BPH, prostate cancer, CKD.  01/09/14 -severe low phos repleted by elink  P:   Monitor renal fx, urine outpt, electrolytes Hold Proscar, flomax, tasigna for  now  GASTROINTESTINAL A: Nutrition. Hx of GERD. P:   Protonix for SUP TF to continue  HEMATOLOGIC A: Anemia of chronic disease, critical illness. Hx of CML. Thrombocytopenia 2nd to sepsis >> resolved. P:  DVT px: SQ heparin F/u CBC intermittently: PRBC for hgb < 7gm%  INFECTIOUS  Tracheal aspirate 01/08/14  A: Pneumonia. - HCAP - no microbial etiology identified, s/p 8 day cefepime ending 01/07/14  01/09/14 -so far culture negative.Appears to havepoor clearance mechanism of lung  P:   Monitor off abx   ENDOCRINE A: DM 2 with renal complications. Hx of gout. P:   SSI Hold allopurinol for now  NEUROLOGIC A: Acute metabolic encephalopathy >> resolved. Neuropathy.   -01/09/14 - on fent gtt and precedex gtt. RASS -2   P:   PAD protocol #2 + 3 =dc fent gtt. Use precedex gtt + fent prn Monitor mental status Hold lyrica for  now   CODE     Code Status Orders        Start     Ordered   12/19/2013 1632  Full code   Continuous     01/06/2014 1644      FAMILY Updates  12/28 - daughter updated at bedside. Explained to patient and daughter toexpect slow recovery with high risk for reintubation 01/08/14 - daughter (non dpoa) Franciscojavier Wronski updated over phone  IDT  On 01/09/2014 LOS is 10 days.  hehas had 2 intubations. Poor clearance of secretions in lung main issue. Will try to diurese agressively. Likely heading twowards trach. Goals of care meeting needed     The patient is critically ill with multiple organ systems failure and requires high complexity decision making for assessment and support, frequent evaluation and titration of therapies, application of advanced monitoring technologies and extensive interpretation of multiple databases.   Critical Care Time devoted to patient care services described in this note is  30  Minutes. This time reflects time of care of this signee Dr Brand Males. This critical care time does not reflect procedure time, or  teaching time or supervisory time of PA/NP/Med student/Med Resident etc but could involve care discussion time    Dr. Brand Males, M.D., [facility].C.P Pulmonary and Critical Care Medicine Staff Physician [place] [facility] and Critical [facility] Pager: [phone], If no answer or between  15:00h - 7:00h: call [phone]  01/09/2014 11:48 AM

## 2014-01-09 NOTE — Progress Notes (Signed)
CRITICAL VALUE ALERT  Critical value received: Phos < 1.0  Date of notification:  01/09/14  Time of notification:  0525  Critical value read back:Yes.    Nurse who received alert:  Allegra Grana, RN  MD notified (1st page):  Dr Deterding  Time of first page:  0530  MD notified (2nd page):  Time of second page:  Responding MD:  Dr Jimmy Footman  Time MD responded:  [phone]

## 2014-01-09 NOTE — Progress Notes (Signed)
FMTS Social Note  Pt is a 73 y.o M who presents with acute on chronic renal failure, hyperkalemia and HCAP/ multifactorial resp failure requiring intubation. Was extubated on 12/24 and reintubated yesterday. Prognosis remains guarded. Chest xray 01/09/14 appears improved. ETT in place. Set TV 600. Electrolytes being repleted. RASS -3  We appreciate the care provided to Dale Taylor by the CCM service. Once he is stable to be transferred to the floor we will gladly accept him onto our service.   Bernadene Bell, MD [place] Resident PGY-2 01/08/2014 8:53 AM

## 2014-01-09 NOTE — Progress Notes (Signed)
UR Completed.  [phone]  

## 2014-01-10 ENCOUNTER — Inpatient Hospital Stay (HOSPITAL_COMMUNITY): Payer: Medicare HMO

## 2014-01-10 LAB — MAGNESIUM: Magnesium: 1.9 mg/dL (ref 1.5–2.5)

## 2014-01-10 LAB — BASIC METABOLIC PANEL
Anion gap: 8 (ref 5–15)
BUN: 42 mg/dL — ABNORMAL HIGH (ref 6–23)
CALCIUM: 8.3 mg/dL — AB (ref 8.4–10.5)
CHLORIDE: 108 meq/L (ref 96–112)
CO2: 27 mmol/L (ref 19–32)
Creatinine, Ser: 1.45 mg/dL — ABNORMAL HIGH (ref 0.50–1.35)
GFR calc non Af Amer: 46 mL/min — ABNORMAL LOW (ref >90)
GFR, EST AFRICAN AMERICAN: 54 mL/min — AB (ref >90)
GLUCOSE: 138 mg/dL — AB (ref 70–99)
Potassium: 4 mmol/L (ref 3.5–5.1)
SODIUM: 143 mmol/L (ref 135–145)

## 2014-01-10 LAB — CBC WITH DIFFERENTIAL/PLATELET
Basophils Absolute: 0 10*3/uL (ref 0.0–0.1)
Basophils Relative: 1 % (ref 0–1)
Eosinophils Absolute: 0.2 10*3/uL (ref 0.0–0.7)
Eosinophils Relative: 4 % (ref 0–5)
HCT: 26 % — ABNORMAL LOW (ref 39.0–52.0)
HEMOGLOBIN: 8.2 g/dL — AB (ref 13.0–17.0)
Lymphocytes Relative: 16 % (ref 12–46)
Lymphs Abs: 1 10*3/uL (ref 0.7–4.0)
MCH: 28 pg (ref 26.0–34.0)
MCHC: 31.5 g/dL (ref 30.0–36.0)
MCV: 88.7 fL (ref 78.0–100.0)
MONO ABS: 1 10*3/uL (ref 0.1–1.0)
Monocytes Relative: 16 % — ABNORMAL HIGH (ref 3–12)
NEUTROS ABS: 4.3 10*3/uL (ref 1.7–7.7)
NEUTROS PCT: 65 % (ref 43–77)
PLATELETS: 408 10*3/uL — AB (ref 150–400)
RBC: 2.93 MIL/uL — AB (ref 4.22–5.81)
RDW: 16.3 % — ABNORMAL HIGH (ref 11.5–15.5)
WBC: 6.6 10*3/uL (ref 4.0–10.5)

## 2014-01-10 LAB — CULTURE, RESPIRATORY W GRAM STAIN: Special Requests: NORMAL

## 2014-01-10 LAB — GLUCOSE, CAPILLARY
GLUCOSE-CAPILLARY: 130 mg/dL — AB (ref 70–99)
GLUCOSE-CAPILLARY: 145 mg/dL — AB (ref 70–99)
GLUCOSE-CAPILLARY: 171 mg/dL — AB (ref 70–99)
GLUCOSE-CAPILLARY: 176 mg/dL — AB (ref 70–99)
Glucose-Capillary: 111 mg/dL — ABNORMAL HIGH (ref 70–99)
Glucose-Capillary: 137 mg/dL — ABNORMAL HIGH (ref 70–99)

## 2014-01-10 LAB — PHOSPHORUS: Phosphorus: 2.3 mg/dL (ref 2.3–4.6)

## 2014-01-10 LAB — CULTURE, RESPIRATORY

## 2014-01-10 NOTE — Progress Notes (Signed)
TF increased to 130cc for 4 hrs per pep up protocol. TF off 0700-1001 on AM shift. TF to decrease at 2338 to orgional rate of 65. Receiving nurse Heath Lark, RN aware)

## 2014-01-10 NOTE — Progress Notes (Signed)
FPTS Social Note  Dale Taylor is a 28 yr M with extensive PMH admitted from SNF on 01/05/2014 for acute on chronic renal failure and hyperkalemia, found to have HCAP and pulm effusion. Progressed to multifactorial respiratory failure requiring intubation on 12/21, extubated on 12/24, and required reintubation on 12/29.  Currently [name] remains intubated under CCM primary service. Checked on patient in ICU and with CCM nursing, current plans are to continue weaning vent and plan family meeting to discuss goals of care.  We appreciate the care provided by the CCM service, and will plan to resume care once the patient is stable and transferred to floor to return to [facility].  Nobie Putnam, [place], PGY-2 01/10/14 [phone]

## 2014-01-10 NOTE — Progress Notes (Signed)
Spoke to daughter Deirdre Evener over phone  Explained LTAC, chronic critical illness, and high morbidity mortality situation  She is not sure if Sid Greener would want LTAC, trach. In fact I am not sure she understood my conversation  Again, urged family meeting face to face: explained phone talks over this are not optimal  She will convene with her mom and other siblings to come up with a family meet time  Dr. Brand Males, M.D., F.C.C.P Pulmonary and Critical Care Medicine Staff Physician [place] Pulmonary and Critical [facility] Pager: [phone], If no answer or between  15:00h - 7:00h: call [phone]  01/10/2014 4:59 PM

## 2014-01-10 NOTE — Progress Notes (Addendum)
PULMONARY / CRITICAL CARE MEDICINE   Name: Dale Taylor MRN: [id number] DOB: 1940/03/12    ADMISSION DATE:  12/25/2013 CONSULTATION DATE:  12/20  REFERRING MD :  FPTS  CHIEF COMPLAINT:  Respiratory failure    INITIAL PRESENTATION:  73 yo male admitted with A on C renal failure, hyperkalemia, HCAP, UTI.  PCCM assumed care in ICU.   has a past medical history of Hypertension; Hypercholesterolemia; GERD (gastroesophageal reflux disease); Dyspnea on exertion; Claudication in peripheral vascular disease; PVD (peripheral vascular disease); S/P renal artery angioplasty (2009--- W/ STENT X1  TO LEFT COMMON ILIAC  ); ED (erectile dysfunction); History of BPH; Frequency of urination; Urgency of urination; Nocturia; Chronic gouty arthritis (FEET); Diabetes mellitus; Prostate cancer (SCHEDULED FOR RADIOACTIVE SEED IMPLANTS OF PROSTATE  05-18-2011); CML (chronic myelocytic leukemia) (FOLLOWED BY DR Bobby Rumpf ([place])); Iron deficiency anemia; Chronic renal insufficiency; PAD (peripheral artery disease); Cardiovascular disease with arteriosclerosis; Blood transfusion (LAST TIME NOV 2012  --  X2  UNITS  (FOR ANEMIA AND THROMBOCYTOPENIA); Leukocytosis; Peripheral edema; History of thrombocytopenia; Heart murmur; CHF (congestive heart failure) (ADMISSION NOV 2012  [facility])  REQUESTED RECORDS); Hypertension; Arthritis; History of angina; and Acute renal failure (Admitted to [facility] 06/06/12 with hip fracture; creatinine 4.3, K 7.8. Aldactone and lisinopril stopped).   has past surgical history that includes Lung lobectomy (AGE 67 (APPROX.)); Hemorrhoid surgery; transthoracic echocardiogram (12-05-2010  [facility])); Cataract extraction w/ intraocular lens  implant, bilateral (2012); AORTOGRAM/ ANGIOPLASTY AND STENTING OF LEFT COMMON ILIAC ARTERY (04-07-2007   DR FIELDS); Cardiovascular stress test (05-19-2011  DR Jenne Campus); Radioactive seed implant (07/21/2011); and Cystoscopy  (07/21/2011).   STUDIES:  12/21 TTE >> EF 40 to 96%, grade 1 diastolic dysfx 22/29 Renal US >> negative 12/21 PAF with RVR. Amiodarone started. Converted to NSR. Cards consult obtained. Recommended continuing current rx. No anticoagulation given high risk 12/22 Increased dyspnea. White out of R hemithorax on CXR. Intubated. Copious purulent secretions from ETT.  12/22 CT chest >> extensive pleural thickening on R. R lung with reduced volume. Bilateral R>L AS dz 12/23 Copious purulent ET secretion. R lung better aerated. Cr improved 12/24 extubated 12/25 Add chest PT 12/27 : Feels more short of breath.s/p bronch -  Has diffuse tracheobronchomalacia and some yellow clear secretion on right side 01/07/14: Sitting on chair with face mask. Has progressive right lung collapse. Needing high flow o2 01/08/14: increased work of breathing -high o2 need. Persistent and worsening Rt lung collapse and REINTUBATED 01/09/14: Post intubation righ lung opened up. On 40% fio2. WUA in progress - on fent gtt + precedex gtt    SUBJECTIVE/OVERNIGHT/INTERVAL HX 01/10/14: no change. Failed SBT  VITAL SIGNS: Temp:  [98.4 F (36.9 C)-99 F (37.2 C)] 98.9 F (37.2 C) (12/31 0800) Pulse Rate:  [54-100] 68 (12/31 1000) Resp:  [0-25] 24 (12/31 1000) BP: (98-165)/(51-67) 141/52 mmHg (12/31 1000) SpO2:  [92 %-100 %] 93 % (12/31 1000) FiO2 (%):  [40 %] 40 % (12/31 0842) Weight:  [96.1 kg (211 lb 13.8 oz)] 96.1 kg (211 lb 13.8 oz) (12/31 0500) INTAKE / OUTPUT:  Intake/Output Summary (Last 24 hours) at 01/10/14 1033 Last data filed at 01/10/14 1001  Gross per 24 hour  Intake 1654.93 ml  Output   3010 ml  Net -1355.07 ml    PHYSICAL EXAMINATION: General: in bed.Intubated.  Neuro:RASS -1 on precedex gtt + fent PRn. Follows commands HEENT: no sinus tenderness Cardiovascular: regular Lungs: decreased BS on Rt, increased wob, paradoxical Abdomen:  Obese,  soft, +bs, suprapubic catheter intact  Ext:  1+ edema    LABS: PULMONARY  Recent Labs Lab 01/06/14 2033 01/08/14 1306  PHART 7.287* 7.178*  PCO2ART 58.0* 71.3*  PO2ART 63.0* 86.0  HCO3 27.7* 26.4*  TCO2 29 29  O2SAT 88.0 93.0    CBC  Recent Labs Lab 01/08/14 0415 01/09/14 0400 01/10/14 0430  HGB 8.6* 8.1* 8.2*  HCT 29.6* 26.2* 26.0*  WBC 6.8 6.3 6.6  PLT 323 347 408*    COAGULATION No results for input(s): INR in the last 168 hours.  CARDIAC  No results for input(s): TROPONINI in the last 168 hours. No results for input(s): PROBNP in the last 168 hours.   CHEMISTRY  Recent Labs Lab 01/05/14 0245 01/06/14 0355 01/07/14 0400 01/08/14 0415 01/09/14 0400 01/09/14 1800  NA 147* 143 144 141 143  --   K 3.8 4.1 4.3 4.7 4.3  --   CL 110 108 109 107 110  --   CO2 31 29 30 25 29   --   GLUCOSE 110* 118* 93 103* 141*  --   BUN 26* 22 21 28* 37*  --   CREATININE 1.39* 1.26 1.28 1.52* 1.75*  --   CALCIUM 8.5 8.5 8.6 8.7 8.4  --   MG  --   --   --  1.8 2.2  --   PHOS  --   --   --  3.0 <1.0* 2.3   Estimated Creatinine Clearance: 44.5 mL/min (by C-G formula based on Cr of 1.75).   LIVER No results for input(s): AST, ALT, ALKPHOS, BILITOT, PROT, ALBUMIN, INR in the last 168 hours.   INFECTIOUS No results for input(s): LATICACIDVEN, PROCALCITON in the last 168 hours.   ENDOCRINE CBG (last 3)   Recent Labs  01/09/14 1922 01/10/14 0014 01/10/14 0404  GLUCAP 147* 145* 130*         IMAGING x48h Dg Chest Port 1 View  01/10/2014   CLINICAL DATA:  Subsequent evaluation for endotracheal tube  EXAM: PORTABLE CHEST - 1 VIEW  COMPARISON:  01/09/2014  FINDINGS: Endotracheal tube tip about 2 cm above the carinal. Enteric tube crosses the gastroesophageal junction and into the stomach. No change in left internal jugular central line with tip over the superior vena cava.  Perihilar left lung infiltrate shows moderate improvement. However there remains left base opacity suggesting consolidation and effusion. Right  diaphragm is elevated. Postsurgical change right upper lobe stable.  IMPRESSION: Bilateral changes as described, with moderate improvement in left perihilar infiltrate.   Electronically Signed   By: Skipper Cliche M.D.   On: 01/10/2014 07:45   Dg Chest Port 1 View  01/09/2014   CLINICAL DATA:  Respiratory failure.  EXAM: PORTABLE CHEST - 1 VIEW  COMPARISON:  01/08/2014.  FINDINGS: Endotracheal tube, NG tube, left IJ line in stable position. Surgical sutures noted over the right upper lung. Persistent diffuse left lung infiltrate. Right base atelectasis versus infiltrate. Small right pleural effusion. No pneumothorax.  IMPRESSION: 1. Lines and tubes in stable position. 2. Persistent diffuse left lung infiltrate. 3. Right base atelectasis and/or infiltrate. 4. Small right pleural effusion. 5. Surgical sutures right upper lung.   Electronically Signed   By: Marcello Moores  Register   On: 01/09/2014 07:46   Dg Chest Port 1 View  01/08/2014   CLINICAL DATA:  Hypoxia  EXAM: PORTABLE CHEST - 1 VIEW  COMPARISON:  Study obtained earlier in the day  FINDINGS: Endotracheal tube tip is 2.4 cm above the  carina. Central catheter tip is at the junction of the left innominate vein and superior vena cava. Nasogastric tube tip and side port extend below the diaphragm. No pneumothorax. There is considerably less effusion on the right compared to earlier in the day. There is patchy infiltrate throughout the left lung fairly diffuse, stable. There is consolidation in the medial right base. Heart is upper normal in size with pulmonary vascular within normal limits. There is postoperative change on the right with volume loss.  IMPRESSION: Tube and catheter positions as described without pneumothorax. Widespread infiltrate on the left, stable. Considerably less opacity on the right compared to earlier in the day. Volume loss with right base consolidation present. No change in cardiac silhouette.   Electronically Signed   By: Lowella Grip M.D.   On: 01/08/2014 11:39   Dg Abd Portable 1v  01/08/2014   CLINICAL DATA:  73 year old NG tube placement.  EXAM: PORTABLE ABDOMEN - 1 VIEW  COMPARISON:  01/01/2014  FINDINGS: An endotracheal tube tip is approximately 1.5 cm above the carina.  An enteric tube tip is seen within the distal stomach. Side port is within the region of the gastric body.  The partially visualized chest demonstrates sequela of prior surgery. The cardiac silhouette and mediastinal contours are unchanged. Patchy areas of consolidation are noted bilaterally. Right pleural thickening is also unchanged.  IMPRESSION: 1. Enteric tube tip projects over the distal stomach. 2. Endotracheal tube tip is approximately 1.5 cm above the carina. 3. Partially visualized chest demonstrates patchy areas of consolidation, question multifocal pneumonia. 4. Small right pleural effusion.   Electronically Signed   By: Rosemarie Ax   On: 01/08/2014 16:41        ASSESSMENT / PLAN:  PULMONARY ETT 12/22 >> 12/24, 01/08/14  A: Acute on chronic hypercarbic respiratory failure 2nd to PNA.  Intubated 12/22 - 12/24 and reintubated 01/08/14 Chronic R pleural thickening of unclear etiology. R lung collapse. S/p bronch 12/27 - diffuse tracheobronchomalacia  01/10/2014 _ Rt lung remains open but failed SBT  P:   WUA as tolerated No extubation 01/10/2014 Likely needs trach - needs goals of care meet first Oxygen to keep SpO2 > 92% F/u CXR Continue bronchial hygiene; step up to vest and percussor   CARDIOVASCULAR Lt IJ CVL 12/22 >> A: Hx of CAD, HTN, HLD, chronic combined CHF, PVD. PAF > in sinus rhythm >> Not considered candidate for chronic anticoagulation.  12/31 - NSR with HR 63 and MAP 73.  3rd spacing continues  P:  Continue amiodarone, ASA, plavix, lopressor, lipitor, imdur Hold outpt norvasc, hydralazine, ranexa for now Continue  lasix with kcl  RENAL A: Acute kidney injury >> improved. Hx of BPH, prostate  cancer, CKD.  01/10/14 - lytes pending  P:   Monitor renal fx, urine outpt, electrolytes Hold Proscar, flomax, tasigna for now  GASTROINTESTINAL A: Nutrition. Hx of GERD. P:   Protonix for SUP TF to continue  HEMATOLOGIC A: Anemia of chronic disease, critical illness. Hx of CML. Thrombocytopenia 2nd to sepsis >> resolved.  P:  DVT px: SQ heparin F/u CBC intermittently: PRBC for hgb < 7gm%  INFECTIOUS  Tracheal aspirate 01/08/14  A: Pneumonia. - HCAP - no microbial etiology identified, s/p 8 day cefepime ending 01/07/14  01/09/14 -so far culture negative. Appears to havepoor clearance mechanism of lung  P:   Monitor off abx   ENDOCRINE A: DM 2 with renal complications. Hx of gout. P:   SSI Hold allopurinol for now  NEUROLOGIC A: Acute metabolic encephalopathy >> resolved. Neuropathy.   -01/09/14 - on  precedex gtt. RASS -2   P:   PAD protocol -  precedex gtt + fent prn Monitor mental status Hold lyrica for now   CODE     Code Status Orders        Start     Ordered   12/15/2013 1632  Full code   Continuous     12/31/2013 1644      FAMILY Updates 12/28 - daughter updated at bedside. Explained to patient and daughter toexpect slow recovery with high risk for reintubation 01/08/14 - daughter (non dpoa) Raju Coppolino updated over phone 01/09/14 - no family at bedside   IDT  On 01/10/2014 LOS is 11 days.  hehas had 2 intubations. Poor clearance of secretions in lung main issue. Will try to diurese agressively. Likely heading twowards trach. Goals of care meeting needed; family not present again today. Left message with daughter Alias Villagran [phone]     The patient is critically ill with multiple organ systems failure and requires high complexity decision making for assessment and support, frequent evaluation and titration of therapies, application of advanced monitoring technologies and extensive interpretation of multiple databases.    Critical Care Time devoted to patient care services described in this note is  30  Minutes. This time reflects time of care of this signee Dr Brand Males. This critical care time does not reflect procedure time, or teaching time or supervisory time of PA/NP/Med student/Med Resident etc but could involve care discussion time    Dr. Brand Males, M.D., [facility].C.P Pulmonary and Critical Care Medicine Staff Physician [place] [facility] Pager: [phone], If no answer or between  15:00h - 7:00h: call [phone]  01/10/2014 10:33 AM

## 2014-01-10 NOTE — Care Management Note (Signed)
    Page 1 of 2   01/31/2014     9:40:49 AM CARE MANAGEMENT NOTE 01/31/2014  Patient:  Dale Taylor, Dale Taylor   Account Number:  [id number]  Date Initiated:  12/31/2013  Documentation initiated by:  Luz Lex  Subjective/Objective Assessment:   Admitted from SNF - Acute renal failure - resp failure     Action/Plan:   Anticipated DC Date:  01/14/2014   Anticipated DC Plan:  SKILLED NURSING FACILITY  In-house referral  Clinical Social Worker      DC Planning Services  CM consult      Choice offered to / List presented to:             Status of service:  Completed, signed off Medicare Important Message given?  YES (If response is "NO", the following Medicare IM given date fields will be blank) Date Medicare IM given:  01/29/2014 Medicare IM given by:  MAYO,HENRIETTA Date Additional Medicare IM given:  01/31/2014 Additional Medicare IM given by:  [facility] Kashara Blocher  Discharge Disposition:    Per UR Regulation:  Reviewed for med. necessity/level of care/duration of stay  If discussed at [place] of Stay Meetings, dates discussed:   01/03/2014  01/08/2014  01/10/2014  01/15/2014  01/17/2014  01/22/2014  01/24/2014  01/29/2014    Comments:  ContactBurhanuddin, Kohlmann Spouse [phone]      Sted,Cotina Daughter [phone]     Auryn, Paige Daughter [phone]  01/30/13 [place] RN MSN BSN CCM Per request of CSW, contacted dtr, Philis Nettle, and discussed requirements to take pt home on vent.  Dtr indicates all relatives are employed and 24/7 care will not be possible. Mtg with PCCM NP and CSW today to discuss options. 1430 Plan is to pursue placement @ [facility].  01/29/13 1500 [place] MSN BSN CCM Vent SNF in [place] declined pt 2/2 "insurance won't pay adequately."  CSW to contact [facility] in [place].   01/25/14 [place] RN MSN BSN CCM CSW has initiated vent SNF bed search.  01/23/14 1003 [place] MSN BSN CCM Following  peer-to-peer, insurance has deemed pt appropriate for vent SNF, denied LTAC.  CSW notified.  01/21/14 [place] MSN BSN CCM Transferred to [place].  RT continues attempts to wean - 2 attempts unsuccessful but RT states he is weaning so far today.  01-17-14 10am [place] Per [facility] - to be considered for Ltach has to have 21 day admission - 7 days post trach with 3 failed weans - then will try for insurance approval for ltach if still needed at that time.  CM will continue to follow.  01-15-14 2:00pm Luz Lex, RNBSN [phone] Trached today.  Intubated on 12-20 ext on 12-24, then reintubated on 12-29 and trached today.  Total of 12 days intubated.  Ltach referrals made. [facility] no ltach benefits in network.  01-09-14 [place] [phone] Extubated but reintubated on 12-29.  Lots of secretions - thick.  SW following.  Introduced self to spouse and daughter.  CM will continue to follow.      12-31-13 3:40pm Luz Lex, RNBSN [phone] Admitted from SNF - SW referral placed.

## 2014-01-10 NOTE — Progress Notes (Signed)
Physical Therapy Treatment Patient Details Name: Dale Taylor MRN: [id number] DOB: Aug 07, 1940 Today's Date: 01/10/2014    History of Present Illness Mohamed Portlock is a 73 y.o. male presenting with acute renal failure according to patient's nursing facility. PMH is significant for hypertension, Type 2 diabetes, chronic diastolic heart failure, hyperkalemia, HLD, prostate cancer, iron deficiency anemia, A. fib with RVR. Intubated 12/22, Extubated 12/24. Re-intubated 12/29.    PT Comments    Patient with significant decline in functional ability since previous therapy session. Required mod-max assist +2 for bed mobility. Able to tolerate sitting on edge of bed x10 minutes focusing on postural control, while RN performed hygiene care. Pt responds to single step commands most of the time but requires facilitory cues. Patient will continue to benefit from skilled physical therapy services to further improve independence with functional mobility.   Follow Up Recommendations  SNF;Supervision/Assistance - 24 hour     Equipment Recommendations  None recommended by PT    Recommendations for Other Services       Precautions / Restrictions Precautions Precautions: Fall Restrictions Weight Bearing Restrictions: No    Mobility  Bed Mobility Overal bed mobility: Needs Assistance;+2 for physical assistance Bed Mobility: Supine to Sit;Rolling;Sit to Supine Rolling: +2 for physical assistance;Mod assist   Supine to sit: HOB elevated;Mod assist;+2 for physical assistance Sit to supine: Mod assist;+2 for physical assistance   General bed mobility comments: Mod assist for rolling to patients Rt side while RN performed hygiene care, VC for technique and tactile facilitation to initiate movement. Mod assist +2 for truncal support and LE support with supine<>sit. Needs cues for sequencing. Min-mod assist for seated balance on edge of bed.  Transfers                    Ambulation/Gait                  Stairs            Wheelchair Mobility    Modified Rankin (Stroke Patients Only)       Balance       Sitting balance - Comments: Practiced sitting balance on edge of bed x10 minutes focusing on upright posture. Needed min-mod assist at times due to posterior lean. Able to use UEs for support intermittently. Tactile facilitation for upright head position, only able to hold for several seconds at a time.                             Cognition Arousal/Alertness: Lethargic;Suspect due to medications Behavior During Therapy: Flat affect Overall Cognitive Status: Difficult to assess         Following Commands: Follows one step commands with increased time;Follows one step commands inconsistently            Exercises General Exercises - Lower Extremity Heel Slides: AAROM;Strengthening;Both;10 reps;Supine    General Comments        Pertinent Vitals/Pain    HR 66 SpO2 98% on vent 40% FiO2 -- PEEP 5 BP 151/61    Home Living                      Prior Function            PT Goals (current goals can now be found in the care plan section) Acute Rehab PT Goals PT Goal Formulation: With patient Time For Goal Achievement: 01/14/14 Potential to Achieve Goals: Fair Progress  towards PT goals: Not progressing toward goals - comment (pt with decline in function after being intubated)    Frequency  Min 2X/week    PT Plan Current plan remains appropriate    Co-evaluation             End of Session Equipment Utilized During Treatment: Other (comment) (vent) Activity Tolerance: Patient limited by fatigue Patient left: with call bell/phone within reach;in bed;with nursing/sitter in room     Time: 0370-4888 PT Time Calculation (min) (ACUTE ONLY): 23 min  Charges:  $Therapeutic Activity: 23-37 mins                    G CodesEllouise Newer 01-24-14, 4:18 PM Camille Bal [place], [place]

## 2014-01-11 ENCOUNTER — Inpatient Hospital Stay (HOSPITAL_COMMUNITY): Payer: Medicare HMO

## 2014-01-11 LAB — GLUCOSE, CAPILLARY
GLUCOSE-CAPILLARY: 139 mg/dL — AB (ref 70–99)
GLUCOSE-CAPILLARY: 183 mg/dL — AB (ref 70–99)
GLUCOSE-CAPILLARY: 187 mg/dL — AB (ref 70–99)
GLUCOSE-CAPILLARY: 223 mg/dL — AB (ref 70–99)
Glucose-Capillary: 134 mg/dL — ABNORMAL HIGH (ref 70–99)
Glucose-Capillary: 210 mg/dL — ABNORMAL HIGH (ref 70–99)

## 2014-01-11 LAB — BASIC METABOLIC PANEL
Anion gap: 7 (ref 5–15)
BUN: 46 mg/dL — ABNORMAL HIGH (ref 6–23)
CALCIUM: 8.5 mg/dL (ref 8.4–10.5)
CHLORIDE: 110 meq/L (ref 96–112)
CO2: 29 mmol/L (ref 19–32)
CREATININE: 1.41 mg/dL — AB (ref 0.50–1.35)
GFR calc Af Amer: 56 mL/min — ABNORMAL LOW (ref >90)
GFR calc non Af Amer: 48 mL/min — ABNORMAL LOW (ref >90)
GLUCOSE: 196 mg/dL — AB (ref 70–99)
Potassium: 4.7 mmol/L (ref 3.5–5.1)
Sodium: 146 mmol/L — ABNORMAL HIGH (ref 135–145)

## 2014-01-11 LAB — CBC WITH DIFFERENTIAL/PLATELET
BASOS PCT: 1 % (ref 0–1)
Basophils Absolute: 0.1 10*3/uL (ref 0.0–0.1)
EOS PCT: 3 % (ref 0–5)
Eosinophils Absolute: 0.2 10*3/uL (ref 0.0–0.7)
HEMATOCRIT: 27.2 % — AB (ref 39.0–52.0)
Hemoglobin: 8.7 g/dL — ABNORMAL LOW (ref 13.0–17.0)
Lymphocytes Relative: 14 % (ref 12–46)
Lymphs Abs: 0.9 10*3/uL (ref 0.7–4.0)
MCH: 29.1 pg (ref 26.0–34.0)
MCHC: 32 g/dL (ref 30.0–36.0)
MCV: 91 fL (ref 78.0–100.0)
MONO ABS: 1 10*3/uL (ref 0.1–1.0)
Monocytes Relative: 16 % — ABNORMAL HIGH (ref 3–12)
Neutro Abs: 4.1 10*3/uL (ref 1.7–7.7)
Neutrophils Relative %: 66 % (ref 43–77)
RBC: 2.99 MIL/uL — ABNORMAL LOW (ref 4.22–5.81)
RDW: 16.6 % — AB (ref 11.5–15.5)
WBC: 6.3 10*3/uL (ref 4.0–10.5)

## 2014-01-11 LAB — MAGNESIUM: Magnesium: 1.6 mg/dL (ref 1.5–2.5)

## 2014-01-11 LAB — PHOSPHORUS: PHOSPHORUS: 2.2 mg/dL — AB (ref 2.3–4.6)

## 2014-01-11 MED ORDER — DEXMEDETOMIDINE HCL IN NACL 400 MCG/100ML IV SOLN
0.0000 ug/kg/h | INTRAVENOUS | Status: DC
  Administered 2014-01-11 – 2014-01-12 (×3): 1.2 ug/kg/h via INTRAVENOUS
  Administered 2014-01-12: 1 ug/kg/h via INTRAVENOUS
  Administered 2014-01-12: 1.2 ug/kg/h via INTRAVENOUS
  Administered 2014-01-12: 1 ug/kg/h via INTRAVENOUS
  Administered 2014-01-12 (×2): 1.2 ug/kg/h via INTRAVENOUS
  Administered 2014-01-13 (×3): 1 ug/kg/h via INTRAVENOUS
  Administered 2014-01-13: 0.8 ug/kg/h via INTRAVENOUS
  Administered 2014-01-14: 0.4 ug/kg/h via INTRAVENOUS
  Administered 2014-01-14: 0.3 ug/kg/h via INTRAVENOUS
  Administered 2014-01-15 (×3): 0.4 ug/kg/h via INTRAVENOUS
  Filled 2014-01-11 (×7): qty 100
  Filled 2014-01-11 (×2): qty 200
  Filled 2014-01-11 (×5): qty 100

## 2014-01-11 MED ORDER — DEXMEDETOMIDINE HCL IN NACL 200 MCG/50ML IV SOLN
0.0000 ug/kg/h | INTRAVENOUS | Status: DC
  Administered 2014-01-11: 1.2 ug/kg/h via INTRAVENOUS

## 2014-01-11 MED ORDER — POTASSIUM PHOSPHATES 15 MMOLE/5ML IV SOLN
10.0000 mmol | Freq: Once | INTRAVENOUS | Status: AC
  Administered 2014-01-11: 10 mmol via INTRAVENOUS
  Filled 2014-01-11: qty 3.33

## 2014-01-11 MED ORDER — MAGNESIUM SULFATE 2 GM/50ML IV SOLN
2.0000 g | Freq: Once | INTRAVENOUS | Status: AC
  Administered 2014-01-11: 2 g via INTRAVENOUS
  Filled 2014-01-11: qty 50

## 2014-01-11 NOTE — Progress Notes (Signed)
FPTS Social Note  Dale Taylor is a 34 yr M with extensive PMH admitted from SNF on 12/15/2013 for acute on chronic renal failure and hyperkalemia, found to have HCAP and pulm effusion. Progressed to multifactorial respiratory failure requiring intubation on 12/21, extubated on 12/24, and required reintubation on 12/29.  Currently [name] remains intubated under CCM primary service. Checked on patient in ICU. Failed SBT today. Will continue plans to wean.  We appreciate the care provided by the CCM service, and will plan to resume care once the patient is stable and transferred to floor to return to [place].  Elberta Leatherwood, MD,MS,  PGY1 01/11/2014 7:06 PM

## 2014-01-11 NOTE — Progress Notes (Signed)
Family conference scheduled for 01/12/14 between the hours of 2 pm and 5 pm. Family meeting scheduled with daughter Dale Taylor) [phone]. Dr. Onnie Graham aware.

## 2014-01-11 NOTE — Progress Notes (Signed)
PULMONARY / CRITICAL CARE MEDICINE   Name: Dale Taylor MRN: [id number] DOB: [birth year]    ADMISSION DATE:  01/07/2014 CONSULTATION DATE:  12/20  REFERRING MD :  FPTS  CHIEF COMPLAINT:  Respiratory failure    INITIAL PRESENTATION:  74 yo male admitted with A on C renal failure, hyperkalemia, HCAP, UTI.  PCCM assumed care in ICU.   has a past medical history of Hypertension; Hypercholesterolemia; GERD (gastroesophageal reflux disease); Dyspnea on exertion; Claudication in peripheral vascular disease; PVD (peripheral vascular disease); S/P renal artery angioplasty (2009--- W/ STENT X1  TO LEFT COMMON ILIAC  ); ED (erectile dysfunction); History of BPH; Frequency of urination; Urgency of urination; Nocturia; Chronic gouty arthritis (FEET); Diabetes mellitus; Prostate cancer (SCHEDULED FOR RADIOACTIVE SEED IMPLANTS OF PROSTATE  05-18-2011); CML (chronic myelocytic leukemia) (FOLLOWED BY DR Bobby Rumpf ([place])); Iron deficiency anemia; Chronic renal insufficiency; PAD (peripheral artery disease); Cardiovascular disease with arteriosclerosis; Blood transfusion (LAST TIME NOV 2012  --  X2  UNITS  (FOR ANEMIA AND THROMBOCYTOPENIA); Leukocytosis; Peripheral edema; History of thrombocytopenia; Heart murmur; CHF (congestive heart failure) (ADMISSION NOV 2012  [facility])  REQUESTED RECORDS); Hypertension; Arthritis; History of angina; and Acute renal failure (Admitted to [facility] 06/06/12 with hip fracture; creatinine 4.3, K 7.8. Aldactone and lisinopril stopped).   has past surgical history that includes Lung lobectomy (AGE 20 (APPROX.)); Hemorrhoid surgery; transthoracic echocardiogram (12-05-2010  [facility])); Cataract extraction w/ intraocular lens  implant, bilateral (2012); AORTOGRAM/ ANGIOPLASTY AND STENTING OF LEFT COMMON ILIAC ARTERY (04-07-2007   DR FIELDS); Cardiovascular stress test (05-19-2011  DR [facility]ne [facility]); Radioactive seed implant (07/21/2011); and Cystoscopy  (07/21/2011).   STUDIES:  12/21 TTE >> EF 40 to 40%, grade 1 diastolic dysfx 98/11 Renal US >> negative 12/21 PAF with RVR. Amiodarone started. Converted to NSR. Cards consult obtained. Recommended continuing current rx. No anticoagulation given high risk 12/22 Increased dyspnea. White out of R hemithorax on CXR. Intubated. Copious purulent secretions from ETT.  12/22 CT chest >> extensive pleural thickening on R. R lung with reduced volume. Bilateral R>L AS dz 12/23 Copious purulent ET secretion. R lung better aerated. Cr improved 12/24 extubated 12/25 Add chest PT 12/27 : Feels more short of breath.s/p bronch -  Has diffuse tracheobronchomalacia and some yellow clear secretion on right side 01/07/14: Sitting on chair with face mask. Has progressive right lung collapse. Needing high flow o2 01/08/14: increased work of breathing -high o2 need. Persistent and worsening Rt lung collapse and REINTUBATED 01/09/14: Post intubation righ lung opened up. On 40% fio2. WUA in progress - on fent gtt + precedex gtt  01/10/14: no change. Failed SBT   SUBJECTIVE/OVERNIGHT/INTERVAL HX 01/11/14: Failed SBT.  Son came today - reports unable to come during week. Daughter Logun Colavito deferes to mom but RN says unable to get hold of mom  VITAL SIGNS: Temp:  [97.7 F (36.5 C)-99 F (37.2 C)] 97.7 F (36.5 C) (01/01 1200) Pulse Rate:  [55-80] 55 (01/01 1400) Resp:  [16-36] 24 (01/01 1400) BP: (118-162)/(39-69) 134/56 mmHg (01/01 1400) SpO2:  [89 %-100 %] 100 % (01/01 1400) FiO2 (%):  [40 %] 40 % (01/01 1137) Weight:  [89.5 kg (197 lb 5 oz)] 89.5 kg (197 lb 5 oz) (01/01 0415) INTAKE / OUTPUT:  Intake/Output Summary (Last 24 hours) at 01/11/14 1530 Last data filed at 01/11/14 1400  Gross per 24 hour  Intake 2238.23 ml  Output   5650 ml  Net -3411.77 ml    PHYSICAL EXAMINATION: General: in  bed.Intubated.  Neuro:RASS -1 on precedex gtt + fent PRn. Follows commands HEENT: no sinus  tenderness Cardiovascular: regular Lungs: decreased BS on Rt, increased wob, paradoxical Abdomen:  Obese, soft, +bs, suprapubic catheter intact  Ext:  1+ edema   LABS: PULMONARY  Recent Labs Lab 01/06/14 2033 01/08/14 1306  PHART 7.287* 7.178*  PCO2ART 58.0* 71.3*  PO2ART 63.0* 86.0  HCO3 27.7* 26.4*  TCO2 29 29  O2SAT 88.0 93.0    CBC  Recent Labs Lab 01/09/14 0400 01/10/14 0430 01/11/14 0400  HGB 8.1* 8.2* 8.7*  HCT 26.2* 26.0* 27.2*  WBC 6.3 6.6 6.3  PLT 347 408* PLATELET CLUMPS NOTED ON SMEAR, COUNT APPEARS INCREASED    COAGULATION No results for input(s): INR in the last 168 hours.  CARDIAC  No results for input(s): TROPONINI in the last 168 hours. No results for input(s): PROBNP in the last 168 hours.   CHEMISTRY  Recent Labs Lab 01/07/14 0400 01/08/14 0415 01/09/14 0400 01/09/14 1800 01/10/14 0430 01/11/14 0400  NA 144 141 143  --  143 146*  K 4.3 4.7 4.3  --  4.0 4.7  CL 109 107 110  --  108 110  CO2 30 25 29   --  27 29  GLUCOSE 93 103* 141*  --  138* 196*  BUN 21 28* 37*  --  42* 46*  CREATININE 1.28 1.52* 1.75*  --  1.45* 1.41*  CALCIUM 8.6 8.7 8.4  --  8.3* 8.5  MG  --  1.8 2.2  --  1.9 1.6  PHOS  --  3.0 <1.0* 2.3 2.3 2.2*   Estimated Creatinine Clearance: 49.7 mL/min (by C-G formula based on Cr of 1.41).   LIVER No results for input(s): AST, ALT, ALKPHOS, BILITOT, PROT, ALBUMIN, INR in the last 168 hours.   INFECTIOUS No results for input(s): LATICACIDVEN, PROCALCITON in the last 168 hours.   ENDOCRINE CBG (last 3)   Recent Labs  01/11/14 0402 01/11/14 0831 01/11/14 1155  GLUCAP 183* 223* 139*         IMAGING x48h Dg Chest Port 1 View  01/11/2014   CLINICAL DATA:  ET tube position  EXAM: PORTABLE CHEST - 1 VIEW  COMPARISON:  01/10/2014  FINDINGS: Support devices are in stable position.  Postoperative changes on the right. Stable right basilar opacity, likely atelectasis. No confluent opacity on the left. Suspect  mild cardiomegaly. Volume loss on the right. Increasing right upper lobe atelectasis or infiltrate. Right basilar opacity likely reflects atelectasis and is stable.  IMPRESSION: Postoperative changes on the right with volume loss. Increasing right upper lobe atelectasis or infiltrate.   Electronically Signed   By: Rolm Baptise M.D.   On: 01/11/2014 09:15   Dg Chest Port 1 View  01/10/2014   CLINICAL DATA:  Subsequent evaluation for endotracheal tube  EXAM: PORTABLE CHEST - 1 VIEW  COMPARISON:  01/09/2014  FINDINGS: Endotracheal tube tip about 2 cm above the carinal. Enteric tube crosses the gastroesophageal junction and into the stomach. No change in left internal jugular central line with tip over the superior vena cava.  Perihilar left lung infiltrate shows moderate improvement. However there remains left base opacity suggesting consolidation and effusion. Right diaphragm is elevated. Postsurgical change right upper lobe stable.  IMPRESSION: Bilateral changes as described, with moderate improvement in left perihilar infiltrate.   Electronically Signed   By: Skipper Cliche M.D.   On: 01/10/2014 07:45        ASSESSMENT / PLAN:  PULMONARY  ETT 12/22 >> 12/24, 01/08/14  A: Acute on chronic hypercarbic respiratory failure 2nd to PNA.  Intubated 12/22 - 12/24 and reintubated 01/08/14 Chronic R pleural thickening of unclear etiology. R lung collapse. S/p bronch 12/27 - diffuse tracheobronchomalacia  01/11/2014 -  Rt lung remains open but failed SBT  P:   WUA as tolerated No extubation 01/11/2014 Likely needs trach - needs goals of care meet first Oxygen to keep SpO2 > 92% F/u CXR Continue bronchial hygiene; step up to vest and percussor   CARDIOVASCULAR Lt IJ CVL 12/22 >> A: Hx of CAD, HTN, HLD, chronic combined CHF, PVD. PAF > in sinus rhythm >> Not considered candidate for chronic anticoagulation.  01/11/13  - improving 3rd spacing  P:  Continue amiodarone, ASA, plavix, lopressor,  lipitor, imdur Hold outpt norvasc, hydralazine, ranexa for now Continue  lasix with kcl  RENAL A: Acute kidney injury >> improved. Hx of BPH, prostate cancer, CKD.  01/11/14 - low mag and low phos  P:   Monitor renal fx, urine outpt, electrolytes Hold Proscar, flomax, tasigna for now  GASTROINTESTINAL A: Nutrition. Hx of GERD. P:   Protonix for SUP TF to continue  HEMATOLOGIC A: Anemia of chronic disease, critical illness. Hx of CML. Thrombocytopenia 2nd to sepsis >> resolved.  P:  DVT px: SQ heparin F/u CBC intermittently: PRBC for hgb < 7gm%  INFECTIOUS  Tracheal aspirate 01/08/14  A: Pneumonia. - HCAP - no microbial etiology identified, s/p 8 day cefepime ending 01/07/14  01/11/13 -  -so far culture negative. Appears to havepoor clearance mechanism of lung  P:   Monitor off abx   ENDOCRINE A: DM 2 with renal complications. Hx of gout. P:   SSI Hold allopurinol for now  NEUROLOGIC A: Acute metabolic encephalopathy >> resolved. Neuropathy.   -01/11/14 - on  precedex gtt. RASS -2   P:   PAD protocol -  precedex gtt + fent prn Monitor mental status Hold lyrica for now   CODE     Code Status Orders        Start     Ordered   12/15/2013 1632  Full code   Continuous     12/24/2013 1644      FAMILY Updates 12/28 - daughter updated at bedside. Explained to patient and daughter toexpect slow recovery with high risk for reintubation 01/08/14 - daughter (non dpoa) Matteus Mcnelly updated over phone 01/09/14 - no family at bedside 12/13/09/5 - dw Deirdre Evener Friede daughter on phone - defers to mom. RNs not able to get hold of mom   IDT  On 01/11/2014 LOS is 12 days.  hehas had 2 intubations. Poor clearance of secretions in lung main issue diurese agressively. Likely heading twowards trach. Goals of care meeting needed. Hard getting hold of family and family willing to be involved fofr discussuions. RN has instructed daughter Chord Takahashi on 01/11/2014 for PM  goals of care meeting 01/12/14  The patient is critically ill with multiple organ systems failure and requires high complexity decision making for assessment and support, frequent evaluation and titration of therapies, application of advanced monitoring technologies and extensive interpretation of multiple databases.   Critical Care Time devoted to patient care services described in this note is  30  Minutes. This time reflects time of care of this signee Dr Brand Males. This critical care time does not reflect procedure time, or teaching time or supervisory time of PA/NP/Med student/Med Resident etc but could involve care discussion time  Dr. Brand Males, M.D., [facility].C.P Pulmonary and Critical Care Medicine Staff Physician [place] Pulmonary and Critical Care Pager: [phone], If no answer or between  15:00h - 7:00h: call [phone]  01/11/2014 3:30 PM

## 2014-01-11 NOTE — Clinical Social Work Note (Signed)
Patient currently not medically stable. Clinical Social Worker will continue to follow pt and pt's family for continued support and to facilitate pt's discharge needs once medically stable.   Glendon Axe, MSW, LCSWA [phone] 01/11/2014 3:59 PM

## 2014-01-11 NOTE — Progress Notes (Signed)
[place] Progress Note Patient Name: Dale Taylor DOB: Mar 29, 1940 MRN: [id number]   Date of Service  01/11/2014  HPI/Events of Note  Agitation remains  eICU Interventions  Still requires sedation Re order     Intervention Category Minor Interventions: Agitation / anxiety - evaluation and management  Raylene Miyamoto. 01/11/2014, 6:35 PM

## 2014-01-11 NOTE — Progress Notes (Signed)
[place] Progress Note Patient Name: Dale Taylor DOB: 06/15/1940 MRN: [id number]   Date of Service  01/11/2014  HPI/Events of Note  Headache still  eICU Interventions  Dc NTG     Intervention Category Minor Interventions: Clinical assessment - ordering diagnostic tests  Raylene Miyamoto. 01/11/2014, 5:39 PM

## 2014-01-11 DEATH — deceased

## 2014-01-12 LAB — CBC WITH DIFFERENTIAL/PLATELET
BASOS ABS: 0.1 10*3/uL (ref 0.0–0.1)
Basophils Relative: 1 % (ref 0–1)
EOS ABS: 0.2 10*3/uL (ref 0.0–0.7)
EOS PCT: 3 % (ref 0–5)
HCT: 31.1 % — ABNORMAL LOW (ref 39.0–52.0)
Hemoglobin: 9.8 g/dL — ABNORMAL LOW (ref 13.0–17.0)
LYMPHS ABS: 1 10*3/uL (ref 0.7–4.0)
Lymphocytes Relative: 13 % (ref 12–46)
MCH: 28.7 pg (ref 26.0–34.0)
MCHC: 31.5 g/dL (ref 30.0–36.0)
MCV: 90.9 fL (ref 78.0–100.0)
Monocytes Absolute: 1.2 10*3/uL — ABNORMAL HIGH (ref 0.1–1.0)
Monocytes Relative: 17 % — ABNORMAL HIGH (ref 3–12)
Neutro Abs: 5 10*3/uL (ref 1.7–7.7)
Neutrophils Relative %: 66 % (ref 43–77)
Platelets: 556 10*3/uL — ABNORMAL HIGH (ref 150–400)
RBC: 3.42 MIL/uL — ABNORMAL LOW (ref 4.22–5.81)
RDW: 16.7 % — AB (ref 11.5–15.5)
WBC: 7.5 10*3/uL (ref 4.0–10.5)

## 2014-01-12 LAB — GLUCOSE, CAPILLARY
GLUCOSE-CAPILLARY: 200 mg/dL — AB (ref 70–99)
GLUCOSE-CAPILLARY: 192 mg/dL — AB (ref 70–99)
Glucose-Capillary: 119 mg/dL — ABNORMAL HIGH (ref 70–99)
Glucose-Capillary: 122 mg/dL — ABNORMAL HIGH (ref 70–99)
Glucose-Capillary: 219 mg/dL — ABNORMAL HIGH (ref 70–99)
Glucose-Capillary: 98 mg/dL (ref 70–99)

## 2014-01-12 LAB — PHOSPHORUS: Phosphorus: 2.4 mg/dL (ref 2.3–4.6)

## 2014-01-12 LAB — BRAIN NATRIURETIC PEPTIDE: B Natriuretic Peptide: 1929 pg/mL — ABNORMAL HIGH (ref 0.0–100.0)

## 2014-01-12 LAB — MAGNESIUM: Magnesium: 1.7 mg/dL (ref 1.5–2.5)

## 2014-01-12 MED ORDER — MAGNESIUM SULFATE 2 GM/50ML IV SOLN
2.0000 g | Freq: Once | INTRAVENOUS | Status: AC
  Administered 2014-01-12: 2 g via INTRAVENOUS
  Filled 2014-01-12: qty 50

## 2014-01-12 NOTE — Progress Notes (Signed)
PULMONARY / CRITICAL CARE MEDICINE   Name: Dale Taylor MRN: [id number] DOB: 03-31-1940    ADMISSION DATE:  12/14/2013 CONSULTATION DATE:  12/20  REFERRING MD :  FPTS  CHIEF COMPLAINT:  Respiratory failure    INITIAL PRESENTATION:  74 yo male admitted with A on C renal failure, hyperkalemia, HCAP, UTI.  PCCM assumed care in ICU.   has a past medical history of Hypertension; Hypercholesterolemia; GERD (gastroesophageal reflux disease); Dyspnea on exertion; Claudication in peripheral vascular disease; PVD (peripheral vascular disease); S/P renal artery angioplasty (2009--- W/ STENT X1  TO LEFT COMMON ILIAC  ); ED (erectile dysfunction); History of BPH; Frequency of urination; Urgency of urination; Nocturia; Chronic gouty arthritis (FEET); Diabetes mellitus; Prostate cancer (SCHEDULED FOR RADIOACTIVE SEED IMPLANTS OF PROSTATE  05-18-2011); CML (chronic myelocytic leukemia) (FOLLOWED BY DR Bobby Rumpf ([place])); Iron deficiency anemia; Chronic renal insufficiency; PAD (peripheral artery disease); Cardiovascular disease with arteriosclerosis; Blood transfusion (LAST TIME NOV 2012  --  X2  UNITS  (FOR ANEMIA AND THROMBOCYTOPENIA); Leukocytosis; Peripheral edema; History of thrombocytopenia; Heart murmur; CHF (congestive heart failure) (ADMISSION NOV 2012  [facility])  REQUESTED RECORDS); Hypertension; Arthritis; History of angina; and Acute renal failure (Admitted to [facility] 06/06/12 with hip fracture; creatinine 4.3, K 7.8. Aldactone and lisinopril stopped).   has past surgical history that includes Lung lobectomy (AGE 39 (APPROX.)); Hemorrhoid surgery; transthoracic echocardiogram (12-05-2010  [facility])); Cataract extraction w/ intraocular lens  implant, bilateral (2012); AORTOGRAM/ ANGIOPLASTY AND STENTING OF LEFT COMMON ILIAC ARTERY (04-07-2007   DR FIELDS); Cardiovascular stress test (05-19-2011  DR [facility]ne [facility]); Radioactive seed implant (07/21/2011); and Cystoscopy  (07/21/2011).   STUDIES:  12/21 TTE >> EF 40 to 01%, grade 1 diastolic dysfx 75/10 Renal US >> negative 12/21 PAF with RVR. Amiodarone started. Converted to NSR. Cards consult obtained. Recommended continuing current rx. No anticoagulation given high risk 12/22 Increased dyspnea. White out of R hemithorax on CXR. Intubated. Copious purulent secretions from ETT.  12/22 CT chest >> extensive pleural thickening on R. R lung with reduced volume. Bilateral R>L AS dz 12/23 Copious purulent ET secretion. R lung better aerated. Cr improved 12/24 extubated 12/25 Add chest PT 12/27 : Feels more short of breath.s/p bronch -  Has diffuse tracheobronchomalacia and some yellow clear secretion on right side 01/07/14: Sitting on chair with face mask. Has progressive right lung collapse. Needing high flow o2 01/08/14: increased work of breathing -high o2 need. Persistent and worsening Rt lung collapse and REINTUBATED 01/09/14: Post intubation righ lung opened up. On 40% fio2. WUA in progress - on fent gtt + precedex gtt  01/10/14: no change. Failed SBT 01/11/14: Failed SBT.  Son came today - reports unable to come during week. Daughter Kaysen Deal deferes to mom but no feedback from mom and  RN says unable to get hold of mom   SUBJECTIVE/OVERNIGHT/INTERVAL HX 01/12/14: Doing PSV. On Chest PT. Family meeting hopefully today  VITAL SIGNS: Temp:  [97.2 F (36.2 C)-97.8 F (36.6 C)] 97.6 F (36.4 C) (01/02 0818) Pulse Rate:  [47-64] 53 (01/02 0910) Resp:  [16-27] 24 (01/02 0910) BP: (134-178)/(54-68) 161/59 mmHg (01/02 0910) SpO2:  [99 %-100 %] 100 % (01/02 0910) FiO2 (%):  [40 %] 40 % (01/02 0910) Weight:  [88.1 kg (194 lb 3.6 oz)] 88.1 kg (194 lb 3.6 oz) (01/02 0356) INTAKE / OUTPUT:  Intake/Output Summary (Last 24 hours) at 01/12/14 1152 Last data filed at 01/12/14 0800  Gross per 24 hour  Intake 2439.36 ml  Output   5850 ml  Net -3410.64 ml    PHYSICAL EXAMINATION: General: in bed.Intubated.   Neuro :RASS 0. on precedex gtt + fent PRn. Follows commands + HEENT: no sinus tenderness Cardiovascular: regular Lungs: decreased BS on Rt, increased wob, paradoxical Abdomen:  Obese, soft, +bs, suprapubic catheter intact  Ext:  1+ edema   LABS: PULMONARY  Recent Labs Lab 01/06/14 2033 01/08/14 1306  PHART 7.287* 7.178*  PCO2ART 58.0* 71.3*  PO2ART 63.0* 86.0  HCO3 27.7* 26.4*  TCO2 29 29  O2SAT 88.0 93.0    CBC  Recent Labs Lab 01/10/14 0430 01/11/14 0400 01/12/14 0419  HGB 8.2* 8.7* 9.8*  HCT 26.0* 27.2* 31.1*  WBC 6.6 6.3 7.5  PLT 408* PLATELET CLUMPS NOTED ON SMEAR, COUNT APPEARS INCREASED 556*    COAGULATION No results for input(s): INR in the last 168 hours.  CARDIAC  No results for input(s): TROPONINI in the last 168 hours. No results for input(s): PROBNP in the last 168 hours.   CHEMISTRY  Recent Labs Lab 01/07/14 0400  01/08/14 0415 01/09/14 0400 01/09/14 1800 01/10/14 0430 01/11/14 0400 01/12/14 0419  NA 144  --  141 143  --  143 146*  --   K 4.3  --  4.7 4.3  --  4.0 4.7  --   CL 109  --  107 110  --  108 110  --   CO2 30  --  25 29  --  27 29  --   GLUCOSE 93  --  103* 141*  --  138* 196*  --   BUN 21  --  28* 37*  --  42* 46*  --   CREATININE 1.28  --  1.52* 1.75*  --  1.45* 1.41*  --   CALCIUM 8.6  --  8.7 8.4  --  8.3* 8.5  --   MG  --   --  1.8 2.2  --  1.9 1.6 1.7  PHOS  --   < > 3.0 <1.0* 2.3 2.3 2.2* 2.4  < > = values in this interval not displayed. Estimated Creatinine Clearance: 49.7 mL/min (by C-G formula based on Cr of 1.41).   LIVER No results for input(s): AST, ALT, ALKPHOS, BILITOT, PROT, ALBUMIN, INR in the last 168 hours.   INFECTIOUS No results for input(s): LATICACIDVEN, PROCALCITON in the last 168 hours.   ENDOCRINE CBG (last 3)   Recent Labs  01/12/14 0040 01/12/14 0358 01/12/14 0731  GLUCAP 122* 98 119*         IMAGING x48h Dg Chest Port 1 View  01/11/2014   CLINICAL DATA:  ET tube position   EXAM: PORTABLE CHEST - 1 VIEW  COMPARISON:  01/10/2014  FINDINGS: Support devices are in stable position.  Postoperative changes on the right. Stable right basilar opacity, likely atelectasis. No confluent opacity on the left. Suspect mild cardiomegaly. Volume loss on the right. Increasing right upper lobe atelectasis or infiltrate. Right basilar opacity likely reflects atelectasis and is stable.  IMPRESSION: Postoperative changes on the right with volume loss. Increasing right upper lobe atelectasis or infiltrate.   Electronically Signed   By: Rolm Baptise M.D.   On: 01/11/2014 09:15        ASSESSMENT / PLAN:  PULMONARY ETT 12/22 >> 12/24, 01/08/14  A: Acute on chronic hypercarbic respiratory failure 2nd to PNA.  Intubated 12/22 - 12/24 and reintubated 01/08/14 Chronic R pleural thickening of unclear etiology. R lung collapse. S/p bronch 12/27 -  diffuse tracheobronchomalacia  01/12/2014 -  Doing SBT P:   WUA as tolerated No extubation 01/12/2014 Likely needs trach - needs goals of care meet first Oxygen to keep SpO2 > 92% F/u CXR Continue bronchial hygiene; step up to vest and percussor   CARDIOVASCULAR Lt IJ CVL 12/22 >> A: Hx of CAD, HTN, HLD, chronic combined CHF, PVD. PAF > in sinus rhythm >> Not considered candidate for chronic anticoagulation.  01/12/13  - improving 3rd spacing  P:  Continue amiodarone, ASA, plavix, lopressor, lipitor, imdur Hold outpt norvasc, hydralazine, ranexa for now Continue  lasix with kcl  RENAL A: Acute kidney injury >> improved. Hx of BPH, prostate cancer, CKD.  01/12/14 - low mag   P:   Replete mag Monitor renal fx, urine outpt, electrolytes Hold Proscar, flomax, tasigna for now  GASTROINTESTINAL A: Nutrition. Hx of GERD. P:   Protonix for SUP TF to continue  HEMATOLOGIC A: Anemia of chronic disease, critical illness. Hx of CML. Thrombocytopenia 2nd to sepsis >> resolved.  P:  DVT px: SQ heparin F/u CBC intermittently:  PRBC for hgb < 7gm%  INFECTIOUS  Tracheal aspirate 01/08/14  A: Pneumonia. - HCAP - no microbial etiology identified, s/p 8 day cefepime ending 01/07/14  01/12/13 -  -so far culture negative. Appears to havepoor clearance mechanism of lung  P:   Monitor off abx   ENDOCRINE A: DM 2 with renal complications. Hx of gout. P:   SSI Hold allopurinol for now  NEUROLOGIC A: Acute metabolic encephalopathy >> resolved. Neuropathy.   -01/12/14 - on  precedex gtt. RASS -1 to -   P:   PAD protocol -  precedex gtt + fent prn Monitor mental status Hold lyrica for now   CODE     Code Status Orders        Start     Ordered   12/28/2013 1632  Full code   Continuous     12/23/2013 1644      FAMILY Updates 12/28 - daughter updated at bedside. Explained to patient and daughter toexpect slow recovery with high risk for reintubation 01/08/14 - daughter (non dpoa) Maikel Neisler updated over phone 01/09/14 - no family at bedside 12/13/09/5 - dw Deirdre Evener Umble daughter on phone - defers to mom. RNs not able to get hold of mom 01/11/14 - son was updated by RN 01/12/14 - hopefully family meeting today. Dificulty getting them to report for family meets  IDT  On 01/12/2014 LOS is 13 days.  hehas had 2 intubations. Poor clearance of secretions in lung main issue diurese agressively. Likely heading twowards trach. Goals of care meeting needed. Hard getting hold of family and family willing to be involved fofr discussuions. RN has instructed daughter Burel Kahre on 01/12/2014 for PM goals of care meeting 01/12/14  The patient is critically ill with multiple organ systems failure and requires high complexity decision making for assessment and support, frequent evaluation and titration of therapies, application of advanced monitoring technologies and extensive interpretation of multiple databases.   Critical Care Time devoted to patient care services described in this note is  30  Minutes. This time reflects  time of care of this signee Dr Brand Males. This critical care time does not reflect procedure time, or teaching time or supervisory time of PA/NP/Med student/Med Resident etc but could involve care discussion time    Dr. Brand Males, M.D., [facility].C.P Pulmonary and Critical Care Medicine Staff Physician [place] Pulmonary and Critical Care Pager:  [phone], If no answer or between  15:00h - 7:00h: call [phone]  01/12/2014 11:52 AM

## 2014-01-13 ENCOUNTER — Inpatient Hospital Stay (HOSPITAL_COMMUNITY): Payer: Medicare HMO

## 2014-01-13 LAB — CBC WITH DIFFERENTIAL/PLATELET
Basophils Absolute: 0.1 10*3/uL (ref 0.0–0.1)
Basophils Relative: 1 % (ref 0–1)
EOS PCT: 2 % (ref 0–5)
Eosinophils Absolute: 0.1 10*3/uL (ref 0.0–0.7)
HCT: 31.3 % — ABNORMAL LOW (ref 39.0–52.0)
Hemoglobin: 9.7 g/dL — ABNORMAL LOW (ref 13.0–17.0)
LYMPHS ABS: 1.2 10*3/uL (ref 0.7–4.0)
LYMPHS PCT: 16 % (ref 12–46)
MCH: 27.4 pg (ref 26.0–34.0)
MCHC: 31 g/dL (ref 30.0–36.0)
MCV: 88.4 fL (ref 78.0–100.0)
Monocytes Absolute: 1 10*3/uL (ref 0.1–1.0)
Monocytes Relative: 14 % — ABNORMAL HIGH (ref 3–12)
Neutro Abs: 5.2 10*3/uL (ref 1.7–7.7)
Neutrophils Relative %: 67 % (ref 43–77)
Platelets: 564 10*3/uL — ABNORMAL HIGH (ref 150–400)
RBC: 3.54 MIL/uL — AB (ref 4.22–5.81)
RDW: 16.5 % — ABNORMAL HIGH (ref 11.5–15.5)
WBC: 7.6 10*3/uL (ref 4.0–10.5)

## 2014-01-13 LAB — MAGNESIUM: Magnesium: 1.8 mg/dL (ref 1.5–2.5)

## 2014-01-13 LAB — GLUCOSE, CAPILLARY
GLUCOSE-CAPILLARY: 139 mg/dL — AB (ref 70–99)
GLUCOSE-CAPILLARY: 179 mg/dL — AB (ref 70–99)
Glucose-Capillary: 183 mg/dL — ABNORMAL HIGH (ref 70–99)
Glucose-Capillary: 157 mg/dL — ABNORMAL HIGH (ref 70–99)
Glucose-Capillary: 123 mg/dL — ABNORMAL HIGH (ref 70–99)
Glucose-Capillary: 188 mg/dL — ABNORMAL HIGH (ref 70–99)

## 2014-01-13 LAB — PHOSPHORUS: Phosphorus: 3.2 mg/dL (ref 2.3–4.6)

## 2014-01-13 MED ORDER — ASPIRIN 81 MG PO CHEW
81.0000 mg | CHEWABLE_TABLET | Freq: Every day | ORAL | Status: DC
  Administered 2014-01-13 – 2014-02-04 (×21): 81 mg
  Filled 2014-01-13 (×23): qty 1

## 2014-01-13 MED ORDER — MAGNESIUM SULFATE 2 GM/50ML IV SOLN
2.0000 g | Freq: Once | INTRAVENOUS | Status: AC
  Administered 2014-01-13: 2 g via INTRAVENOUS
  Filled 2014-01-13: qty 50

## 2014-01-13 MED ORDER — ISOSORBIDE MONONITRATE ER 30 MG PO TB24
30.0000 mg | ORAL_TABLET | Freq: Every morning | ORAL | Status: DC
  Administered 2014-01-13: 30 mg via ORAL
  Filled 2014-01-13 (×2): qty 1

## 2014-01-13 MED ORDER — METOPROLOL TARTRATE 25 MG/10 ML ORAL SUSPENSION
12.5000 mg | Freq: Two times a day (BID) | ORAL | Status: DC
  Filled 2014-01-13 (×2): qty 5

## 2014-01-13 MED ORDER — ATORVASTATIN CALCIUM 20 MG PO TABS
20.0000 mg | ORAL_TABLET | Freq: Every day | ORAL | Status: DC
  Administered 2014-01-13 – 2014-02-03 (×20): 20 mg
  Filled 2014-01-13 (×24): qty 1

## 2014-01-13 MED ORDER — CLOPIDOGREL BISULFATE 75 MG PO TABS
75.0000 mg | ORAL_TABLET | Freq: Every day | ORAL | Status: DC
  Filled 2014-01-13: qty 1

## 2014-01-13 NOTE — Progress Notes (Signed)
eLink Physician-Brief Progress Note Patient Name: Dale Taylor DOB: 12/13/1940 MRN: [id number]   Date of Service  01/13/2014  HPI/Events of Note  brady  eICU Interventions  reducelopressor 12.5 bid Decrease precedex dose     Intervention Category Intermediate Interventions: Medication change / dose adjustment  Lineth Thielke V. 01/13/2014, 9:14 PM

## 2014-01-13 NOTE — Progress Notes (Signed)
PULMONARY / CRITICAL CARE MEDICINE   Name: Dale Taylor MRN: [id number] DOB: 10-Dec-1940    ADMISSION DATE:  12/22/2013 CONSULTATION DATE:  12/20  REFERRING MD :  FPTS  CHIEF COMPLAINT:  Respiratory failure    INITIAL PRESENTATION:  74 yo male admitted with A on C renal failure, hyperkalemia, HCAP, UTI.  PCCM assumed care in ICU.   has a past medical history of Hypertension; Hypercholesterolemia; GERD (gastroesophageal reflux disease); Dyspnea on exertion; Claudication in peripheral vascular disease; PVD (peripheral vascular disease); S/P renal artery angioplasty (2009--- W/ STENT X1  TO LEFT COMMON ILIAC  ); ED (erectile dysfunction); History of BPH; Frequency of urination; Urgency of urination; Nocturia; Chronic gouty arthritis (FEET); Diabetes mellitus; Prostate cancer (SCHEDULED FOR RADIOACTIVE SEED IMPLANTS OF PROSTATE  05-18-2011); CML (chronic myelocytic leukemia) (FOLLOWED BY DR Bobby Rumpf ([place])); Iron deficiency anemia; Chronic renal insufficiency; PAD (peripheral artery disease); Cardiovascular disease with arteriosclerosis; Blood transfusion (LAST TIME NOV 2012  --  X2  UNITS  (FOR ANEMIA AND THROMBOCYTOPENIA); Leukocytosis; Peripheral edema; History of thrombocytopenia; Heart murmur; CHF (congestive heart failure) (ADMISSION NOV 2012  [facility])  REQUESTED RECORDS); Hypertension; Arthritis; History of angina; and Acute renal failure (Admitted to [facility] 06/06/12 with hip fracture; creatinine 4.3, K 7.8. Aldactone and lisinopril stopped).   has past surgical history that includes Lung lobectomy (AGE 21 (APPROX.)); Hemorrhoid surgery; transthoracic echocardiogram (12-05-2010  [facility])); Cataract extraction w/ intraocular lens  implant, bilateral (2012); AORTOGRAM/ ANGIOPLASTY AND STENTING OF LEFT COMMON ILIAC ARTERY (04-07-2007   DR FIELDS); Cardiovascular stress test (05-19-2011  DR Jenne [facility]); Radioactive seed implant (07/21/2011); and Cystoscopy  (07/21/2011).   STUDIES:  12/21 TTE >> EF 40 to 50%, grade 1 diastolic dysfx 09/38 Renal US >> negative 12/21 PAF with RVR. Amiodarone started. Converted to NSR. Cards consult obtained. Recommended continuing current rx. No anticoagulation given high risk 12/22 Increased dyspnea. White out of R hemithorax on CXR. Intubated. Copious purulent secretions from ETT.  12/22 CT chest >> extensive pleural thickening on R. R lung with reduced volume. Bilateral R>L AS dz 12/23 Copious purulent ET secretion. R lung better aerated. Cr improved 12/24 extubated 12/25 Add chest PT 12/27 : Feels more short of breath.s/p bronch -  Has diffuse tracheobronchomalacia and some yellow clear secretion on right side 01/07/14: Sitting on chair with face mask. Has progressive right lung collapse. Needing high flow o2 01/08/14: increased work of breathing -high o2 need. Persistent and worsening Rt lung collapse and REINTUBATED 01/09/14: Post intubation righ lung opened up. On 40% fio2. WUA in progress - on fent gtt + precedex gtt  01/10/14: no change. Failed SBT 01/11/14: Failed SBT.  Son came today - reports unable to come during week. Daughter Dale Taylor deferes to mom but no feedback from mom and  RN says unable to get hold of mom 01/12/14: Doing PSV. On Chest PT. Family meeting - desire to go ahead with Lurline Idol [facility]   SUBJECTIVE/OVERNIGHT/INTERVAL HX 01/13/14: patient ok with getting trach, ltac - direct conversation with Dr Chase Caller. Doing PSV curent  VITAL SIGNS: Temp:  [97.3 F (36.3 C)-98.1 F (36.7 C)] 97.9 F (36.6 C) (01/03 1159) Pulse Rate:  [49-56] 51 (01/03 1200) Resp:  [0-25] 21 (01/03 1200) BP: (95-150)/(43-72) 132/61 mmHg (01/03 1200) SpO2:  [96 %-100 %] 100 % (01/03 1200) FiO2 (%):  [40 %] 40 % (01/03 1109) Weight:  [85.2 kg (187 lb 13.3 oz)] 85.2 kg (187 lb 13.3 oz) (01/03 0600) INTAKE / OUTPUT:  Intake/Output Summary (  Last 24 hours) at 01/13/14 1408 Last data filed at 01/13/14 1200  Gross  per 24 hour  Intake 2187.8 ml  Output   3650 ml  Net -1462.2 ml    PHYSICAL EXAMINATION: General: in bed.Intubated.  Neuro :RASS 0. on precedex gtt + fent PRn. Follows commands + HEENT: no sinus tenderness Cardiovascular: regular Lungs: decreased BS on Rt, increased wob, paradoxical Abdomen:  Obese, soft, +bs, suprapubic catheter intact  Ext:  1+ edema   LABS: PULMONARY  Recent Labs Lab 01/06/14 2033 01/08/14 1306  PHART 7.287* 7.178*  PCO2ART 58.0* 71.3*  PO2ART 63.0* 86.0  HCO3 27.7* 26.4*  TCO2 29 29  O2SAT 88.0 93.0    CBC  Recent Labs Lab 01/11/14 0400 01/12/14 0419 01/13/14 0430  HGB 8.7* 9.8* 9.7*  HCT 27.2* 31.1* 31.3*  WBC 6.3 7.5 7.6  PLT PLATELET CLUMPS NOTED ON SMEAR, COUNT APPEARS INCREASED 556* 564*    COAGULATION No results for input(s): INR in the last 168 hours.  CARDIAC  No results for input(s): TROPONINI in the last 168 hours. No results for input(s): PROBNP in the last 168 hours.   CHEMISTRY  Recent Labs Lab 01/07/14 0400  01/08/14 0415 01/09/14 0400 01/09/14 1800 01/10/14 0430 01/11/14 0400 01/12/14 0419 01/13/14 0430  NA 144  --  141 143  --  143 146*  --   --   K 4.3  --  4.7 4.3  --  4.0 4.7  --   --   CL 109  --  107 110  --  108 110  --   --   CO2 30  --  25 29  --  27 29  --   --   GLUCOSE 93  --  103* 141*  --  138* 196*  --   --   BUN 21  --  28* 37*  --  42* 46*  --   --   CREATININE 1.28  --  1.52* 1.75*  --  1.45* 1.41*  --   --   CALCIUM 8.6  --  8.7 8.4  --  8.3* 8.5  --   --   MG  --   < > 1.8 2.2  --  1.9 1.6 1.7 1.8  PHOS  --   < > 3.0 <1.0* 2.3 2.3 2.2* 2.4 3.2  < > = values in this interval not displayed. Estimated Creatinine Clearance: 49.7 mL/min (by C-G formula based on Cr of 1.41).   LIVER No results for input(s): AST, ALT, ALKPHOS, BILITOT, PROT, ALBUMIN, INR in the last 168 hours.   INFECTIOUS No results for input(s): LATICACIDVEN, PROCALCITON in the last 168 hours.   ENDOCRINE CBG (last  3)   Recent Labs  01/13/14 0350 01/13/14 0728 01/13/14 1153  GLUCAP 183* 157* 139*         IMAGING x48h Dg Chest Port 1 View  01/13/2014   CLINICAL DATA:  Acute respiratory failure.  EXAM: PORTABLE CHEST - 1 VIEW  COMPARISON:  01/11/2014  FINDINGS: Support apparatus: Endotracheal tube tip 4.5 cm above carina. Nasogastric tube extends beyond the inferior aspect of the film. Left internal jugular line tip high SVC.  Cardiomediastinal silhouette: Surgical changes in the right suprahilar region. Normal heart size.  Pleura: Moderate fat hemidiaphragm elevation, secondary to volume loss in the right hemi thorax. Left costophrenic angle minimally excluded. No pleural effusion or pneumothorax.  Lungs: Right base atelectasis. Improvement in right upper lobe/ apical aeration.  Other: None  IMPRESSION:  Improved right upper lobe aeration. Postoperative volume loss in the right hemi thorax, without acute superimposed process.   Electronically Signed   By: Abigail Miyamoto M.D.   On: 01/13/2014 08:31        ASSESSMENT / PLAN:  PULMONARY ETT 12/22 >> 12/24, 01/08/14  A: Acute on chronic hypercarbic respiratory failure 2nd to PNA.  Intubated 12/22 - 12/24 and reintubated 01/08/14 Chronic R pleural thickening of unclear etiology. R lung collapse. S/p bronch 12/27 - diffuse tracheobronchomalacia  01/12/2014 and 01/13/14 -  Doing SBT but needs trach P:   WUA as tolerated No extubation 01/13/2014 Plan for trch week of 01/14/14 Oxygen to keep SpO2 > 92% F/u CXR Continue bronchial hygiene; step up to vest and percussor   CARDIOVASCULAR Lt IJ CVL 12/22 >> A: Hx of CAD, HTN, HLD, chronic combined CHF, PVD. PAF > in sinus rhythm >> Not considered candidate for chronic anticoagulation.  01/13/13  - improving 3rd spacing  P:  Hold outpt norvasc, hydralazine, ranexa for now Holding home amio, asam, plavix, lipitor, since 01/06/14 but will restart asa, lipitor, and imdur but hold plavix in anitcipation of  trach Continue  lasix with kcl  RENAL A: Acute kidney injury >> improved. Hx of BPH, prostate cancer, CKD.  01/13/14 - low mag   P:   Replete mag Monitor renal fx, urine outpt, electrolytes Hold Proscar, flomax, tasigna for now  GASTROINTESTINAL A: Nutrition. Hx of GERD. P:   Protonix for SUP TF to continue  HEMATOLOGIC A: Anemia of chronic disease, critical illness. Hx of CML. Thrombocytopenia 2nd to sepsis >> resolved.  P:  DVT px: SQ heparin F/u CBC intermittently: PRBC for hgb < 7gm%  INFECTIOUS  Tracheal aspirate 01/08/14  A: Pneumonia. - HCAP - no microbial etiology identified, s/p 8 day cefepime ending 01/07/14  01/12/13 -  -so far culture negative. Appears to havepoor clearance mechanism of lung  P:   Monitor off abx   ENDOCRINE A: DM 2 with renal complications. Hx of gout. P:   SSI Hold allopurinol for now  NEUROLOGIC A: Acute metabolic encephalopathy >> resolved. Neuropathy.   -01/12/14 - on  precedex gtt. RASS 0. CAM-ICU negative for delirium  P:   PAD protocol -  precedex gtt + fent prn Monitor mental status Hold lyrica for now   CODE     Code Status Orders        Start     Ordered   01/10/2014 1632  Full code   Continuous     12/17/2013 1644      FAMILY Updates 12/28 - daughter updated at bedside. Explained to patient and daughter toexpect slow recovery with high risk for reintubation 01/08/14 - daughter (non dpoa) Dale Taylor updated over phone 01/09/14 - no family at bedside 12/13/09/5 - dw Dale Taylor Dawn daughter on phone - defers to mom. RNs not able to get hold of mom 01/11/14 - son was updated by RN 01/12/14 - hopefully family meeting today. Dificulty getting them to report for family meets  IDT  15 family member meet held 01/12/14 - > 30 min duration. Overall some expressed that he is "tired" but overall everyone felt that this could just be a reflection of physical state. HE was working and active till few months ago and broke  hip and entered SNF. They wall want to move ahead with LTAC, Trach. Patient agreeed to plan to Dr Chase Caller 01/13/14   The patient is critically ill with multiple organ systems failure and  requires high complexity decision making for assessment and support, frequent evaluation and titration of therapies, application of advanced monitoring technologies and extensive interpretation of multiple databases.   Critical Care Time devoted to patient care services described in this note is  30  Minutes. This time reflects time of care of this signee Dr Brand Males. This critical care time does not reflect procedure time, or teaching time or supervisory time of PA/NP/Med student/Med Resident etc but could involve care discussion time    Dr. Brand Males, M.D., [facility].C.P Pulmonary and Critical Care Medicine Staff Physician [place] [facility] Pager: [phone], If no answer or between  15:00h - 7:00h: call [phone]  01/13/2014 2:08 PM

## 2014-01-14 ENCOUNTER — Encounter (HOSPITAL_COMMUNITY): Payer: Medicare Other

## 2014-01-14 ENCOUNTER — Inpatient Hospital Stay (HOSPITAL_COMMUNITY): Payer: Medicare HMO

## 2014-01-14 DIAGNOSIS — J9600 Acute respiratory failure, unspecified whether with hypoxia or hypercapnia: Secondary | ICD-10-CM | POA: Insufficient documentation

## 2014-01-14 DIAGNOSIS — N170 Acute kidney failure with tubular necrosis: Secondary | ICD-10-CM

## 2014-01-14 LAB — BASIC METABOLIC PANEL
ANION GAP: 7 (ref 5–15)
Anion gap: 6 (ref 5–15)
Anion gap: 5 (ref 5–15)
BUN: 53 mg/dL — ABNORMAL HIGH (ref 6–23)
BUN: 53 mg/dL — AB (ref 6–23)
BUN: 54 mg/dL — AB (ref 6–23)
CALCIUM: 8.7 mg/dL (ref 8.4–10.5)
CHLORIDE: 111 meq/L (ref 96–112)
CO2: 34 mmol/L — ABNORMAL HIGH (ref 19–32)
CO2: 31 mmol/L (ref 19–32)
CO2: 31 mmol/L (ref 19–32)
CREATININE: 1.47 mg/dL — AB (ref 0.50–1.35)
Calcium: 9 mg/dL (ref 8.4–10.5)
Calcium: 8.7 mg/dL (ref 8.4–10.5)
Chloride: 107 mEq/L (ref 96–112)
Chloride: 111 mEq/L (ref 96–112)
Creatinine, Ser: 1.3 mg/dL (ref 0.50–1.35)
Creatinine, Ser: 1.55 mg/dL — ABNORMAL HIGH (ref 0.50–1.35)
GFR calc Af Amer: 61 mL/min — ABNORMAL LOW (ref >90)
GFR calc Af Amer: 53 mL/min — ABNORMAL LOW (ref >90)
GFR calc Af Amer: 50 mL/min — ABNORMAL LOW (ref >90)
GFR calc non Af Amer: 53 mL/min — ABNORMAL LOW (ref >90)
GFR calc non Af Amer: 46 mL/min — ABNORMAL LOW (ref >90)
GFR, EST NON AFRICAN AMERICAN: 43 mL/min — AB (ref >90)
GLUCOSE: 203 mg/dL — AB (ref 70–99)
Glucose, Bld: 176 mg/dL — ABNORMAL HIGH (ref 70–99)
Glucose, Bld: 81 mg/dL (ref 70–99)
POTASSIUM: 5.1 mmol/L (ref 3.5–5.1)
POTASSIUM: 4.8 mmol/L (ref 3.5–5.1)
Potassium: 6.3 mmol/L (ref 3.5–5.1)
Sodium: 147 mmol/L — ABNORMAL HIGH (ref 135–145)
Sodium: 149 mmol/L — ABNORMAL HIGH (ref 135–145)
Sodium: 147 mmol/L — ABNORMAL HIGH (ref 135–145)

## 2014-01-14 LAB — CBC WITH DIFFERENTIAL/PLATELET
Basophils Absolute: 0 10*3/uL (ref 0.0–0.1)
Basophils Relative: 0 % (ref 0–1)
Eosinophils Absolute: 0.1 10*3/uL (ref 0.0–0.7)
Eosinophils Relative: 2 % (ref 0–5)
HCT: 30.8 % — ABNORMAL LOW (ref 39.0–52.0)
Hemoglobin: 9.4 g/dL — ABNORMAL LOW (ref 13.0–17.0)
Lymphocytes Relative: 14 % (ref 12–46)
Lymphs Abs: 1.2 10*3/uL (ref 0.7–4.0)
MCH: 27.3 pg (ref 26.0–34.0)
MCHC: 30.5 g/dL (ref 30.0–36.0)
MCV: 89.5 fL (ref 78.0–100.0)
Monocytes Absolute: 0.8 10*3/uL (ref 0.1–1.0)
Monocytes Relative: 10 % (ref 3–12)
Neutro Abs: 5.9 10*3/uL (ref 1.7–7.7)
Neutrophils Relative %: 74 % (ref 43–77)
Platelets: 569 10*3/uL — ABNORMAL HIGH (ref 150–400)
RBC: 3.44 MIL/uL — ABNORMAL LOW (ref 4.22–5.81)
RDW: 16.6 % — ABNORMAL HIGH (ref 11.5–15.5)
WBC: 8.1 10*3/uL (ref 4.0–10.5)

## 2014-01-14 LAB — PROTIME-INR
INR: 1.18 (ref 0.00–1.49)
Prothrombin Time: 15.2 seconds (ref 11.6–15.2)

## 2014-01-14 LAB — GLUCOSE, CAPILLARY
GLUCOSE-CAPILLARY: 144 mg/dL — AB (ref 70–99)
GLUCOSE-CAPILLARY: 69 mg/dL — AB (ref 70–99)
Glucose-Capillary: 152 mg/dL — ABNORMAL HIGH (ref 70–99)
Glucose-Capillary: 161 mg/dL — ABNORMAL HIGH (ref 70–99)
Glucose-Capillary: 170 mg/dL — ABNORMAL HIGH (ref 70–99)
Glucose-Capillary: 179 mg/dL — ABNORMAL HIGH (ref 70–99)
Glucose-Capillary: 230 mg/dL — ABNORMAL HIGH (ref 70–99)
Glucose-Capillary: 79 mg/dL (ref 70–99)

## 2014-01-14 LAB — POCT I-STAT 3, ART BLOOD GAS (G3+)
Acid-Base Excess: 6 mmol/L — ABNORMAL HIGH (ref 0.0–2.0)
Bicarbonate: 32.8 mEq/L — ABNORMAL HIGH (ref 20.0–24.0)
O2 SAT: 96 %
PCO2 ART: 55.8 mmHg — AB (ref 35.0–45.0)
PO2 ART: 88 mmHg (ref 80.0–100.0)
Patient temperature: 98.6
TCO2: 34 mmol/L (ref 0–100)
pH, Arterial: 7.377 (ref 7.350–7.450)

## 2014-01-14 LAB — MAGNESIUM: Magnesium: 2 mg/dL (ref 1.5–2.5)

## 2014-01-14 LAB — PHOSPHORUS: Phosphorus: 3.6 mg/dL (ref 2.3–4.6)

## 2014-01-14 LAB — APTT: aPTT: 33 seconds (ref 24–37)

## 2014-01-14 MED ORDER — PROPOFOL 10 MG/ML IV EMUL: 5.0000 ug/kg/min | Freq: Once | INTRAVENOUS | Status: DC

## 2014-01-14 MED ORDER — INSULIN ASPART 100 UNIT/ML ~~LOC~~ SOLN: 10.0000 [IU] | Freq: Once | SUBCUTANEOUS | Status: DC

## 2014-01-14 MED ORDER — FENTANYL CITRATE 0.05 MG/ML IJ SOLN
200.0000 ug | Freq: Once | INTRAMUSCULAR | Status: AC
  Administered 2014-01-15: 200 ug via INTRAVENOUS
  Filled 2014-01-14: qty 4

## 2014-01-14 MED ORDER — DEXTROSE 50 % IV SOLN
INTRAVENOUS | Status: AC
Start: 2014-01-14 — End: 2014-01-14
  Administered 2014-01-14: 50 mL
  Filled 2014-01-14: qty 50

## 2014-01-14 MED ORDER — DEXTROSE 5 % IV SOLN
INTRAVENOUS | Status: DC
  Administered 2014-01-14 – 2014-01-15 (×2): via INTRAVENOUS
  Administered 2014-01-16 – 2014-01-17 (×2): 80 mL via INTRAVENOUS

## 2014-01-14 MED ORDER — ISOSORBIDE DINITRATE 30 MG PO TABS
30.0000 mg | ORAL_TABLET | Freq: Three times a day (TID) | ORAL | Status: DC
  Filled 2014-01-14 (×2): qty 1

## 2014-01-14 MED ORDER — MIDAZOLAM HCL 2 MG/2ML IJ SOLN
4.0000 mg | Freq: Once | INTRAMUSCULAR | Status: AC
  Administered 2014-01-15: 4 mg via INTRAVENOUS
  Filled 2014-01-14: qty 4

## 2014-01-14 MED ORDER — DEXTROSE 50 % IV SOLN
1.0000 | Freq: Once | INTRAVENOUS | Status: AC
  Administered 2014-01-14: 50 mL via INTRAVENOUS
  Filled 2014-01-14: qty 50

## 2014-01-14 MED ORDER — VECURONIUM BROMIDE 10 MG IV SOLR
10.0000 mg | Freq: Once | INTRAVENOUS | Status: AC
  Administered 2014-01-15: 10 mg via INTRAVENOUS

## 2014-01-14 MED ORDER — ETOMIDATE 2 MG/ML IV SOLN
40.0000 mg | Freq: Once | INTRAVENOUS | Status: AC
  Administered 2014-01-15: 35 mg via INTRAVENOUS
  Administered 2014-01-16: 20 mg via INTRAVENOUS
  Filled 2014-01-14: qty 20

## 2014-01-14 MED ORDER — ISOSORBIDE DINITRATE 20 MG PO TABS
20.0000 mg | ORAL_TABLET | Freq: Three times a day (TID) | ORAL | Status: DC
  Administered 2014-01-14 – 2014-01-16 (×5): 20 mg via ORAL
  Filled 2014-01-14 (×8): qty 1

## 2014-01-14 MED ORDER — DEXTROSE 50 % IV SOLN
INTRAVENOUS | Status: AC
  Administered 2014-01-14: 50 mL via INTRAVENOUS
  Filled 2014-01-14: qty 50

## 2014-01-14 MED ORDER — INSULIN ASPART 100 UNIT/ML ~~LOC~~ SOLN
10.0000 [IU] | Freq: Once | SUBCUTANEOUS | Status: AC
  Administered 2014-01-14: 10 [IU] via INTRAVENOUS

## 2014-01-14 MED ORDER — DOCUSATE SODIUM 50 MG/5ML PO LIQD
50.0000 mg | Freq: Every day | ORAL | Status: DC | PRN
  Administered 2014-01-23: 50 mg
  Filled 2014-01-14 (×2): qty 10

## 2014-01-14 MED ORDER — SODIUM CHLORIDE 0.9 % IV SOLN
2.0000 g | Freq: Once | INTRAVENOUS | Status: AC
  Administered 2014-01-14: 2 g via INTRAVENOUS
  Filled 2014-01-14: qty 20

## 2014-01-14 MED ORDER — SODIUM POLYSTYRENE SULFONATE 15 GM/60ML PO SUSP
45.0000 g | Freq: Once | ORAL | Status: AC
  Administered 2014-01-14: 45 g via ORAL
  Filled 2014-01-14: qty 180

## 2014-01-14 NOTE — Progress Notes (Signed)
Replaced tube holder.

## 2014-01-14 NOTE — Progress Notes (Signed)
CRITICAL VALUE ALERT  Critical value received:  Potassium 6.3  Date of notification:  01/14/2013  Time of notification:  0600  Critical value read back: Yes  Nurse who received alert:  Rito Ehrlich  MD notified (1st page):  Dr. Deterding/ Warren Lacy  Time of first page:  0601  MD notified (2nd page):  Time of second page:  Responding MD:  Dr. Deterding/ Warren Lacy  Time MD responded:  [phone]

## 2014-01-14 NOTE — Progress Notes (Signed)
Wasted 60mcg fentanyl x2 with Shea Stakes in pyxis from earlier this am.

## 2014-01-14 NOTE — Progress Notes (Signed)
NUTRITION FOLLOW UP  Intervention:    Continue TF via OGT with Vital AF 1.2 at goal rate of 65 ml/h (1560 ml per day) to provide 1872 kcals, 117 gm protein, 1265 ml free water daily.  Nutrition Dx:   Inadequate oral intake related to inability to eat as evidenced by NPO status, ongoing.  Goal:   Intake to meet >90% of estimated nutrition needs. Met.  Monitor:   TF tolerance/adequacy, weight trend, labs, respiratory status.  Assessment:   74yo male with extensive PMH including hx CML, prostate ca, HTN, chronic dCHF, chronic suprapubic catheter, DM and CKD presented 12/20 from SNF with concern for "kidney problems". Admitted by [facility] teaching service with acute on chronic renal failure with hyperkalemia, HCAP +/- worsening R effusion and UTI. PCCM consulted r/t respiratory acidosis and increasing somnolence.   Patient required re-intubation on 12/29. Has been receiving TF of Vital AF 1.2 at 65 ml/h (1560 ml per day) to provide 1872 kcals, 117 gm protein, 1265 ml free water daily. Tolerating well to meet nutrition goals.  Discussed patient in ICU rounds today. Plans for tracheostomy on 1/5.  Patient is currently intubated on ventilator support MV: 10.1, 9.5 L/min Temp (24hrs), Avg:97.8 F (36.6 C), Min:97.3 F (36.3 C), Max:98.6 F (37 C)  Propofol: none  Height: Ht Readings from Last 1 Encounters:  01/08/14 5' 11" (1.803 m)    Weight Status:   Wt Readings from Last 1 Encounters:  01/14/14 190 lb 4.1 oz (86.3 kg)   01/08/14 211 lb 6.7 oz (95.9 kg)   01/02/14 223 lb 12.3 oz (101.5 kg)   Body mass index is 26.55 kg/(m^2).  Ideal Body Weight: 78.2 kg  Re-estimated needs:  Kcal: 1850 Protein: 115-125 gm Fluid: 2 L  Skin: skin tear to buttocks  Diet Order: Diet NPO time specified Diet NPO time specified   Intake/Output Summary (Last 24 hours) at 01/14/14 1348 Last data filed at 01/14/14 1200  Gross per 24 hour  Intake 2020.33 ml  Output   3375 ml  Net  -1354.67 ml    Last BM: 12/24   Labs:   Recent Labs Lab 01/10/14 0430 01/11/14 0400 01/12/14 0419 01/13/14 0430 01/14/14 0500  NA 143 146*  --   --  147*  K 4.0 4.7  --   --  6.3*  CL 108 110  --   --  107  CO2 27 29  --   --  34*  BUN 42* 46*  --   --  53*  CREATININE 1.45* 1.41*  --   --  1.30  CALCIUM 8.3* 8.5  --   --  9.0  MG 1.9 1.6 1.7 1.8 2.0  PHOS 2.3 2.2* 2.4 3.2 3.6  GLUCOSE 138* 196*  --   --  176*    CBG (last 3)   Recent Labs  01/14/14 0421 01/14/14 0812 01/14/14 1145  GLUCAP 152* 144* 170*    Scheduled Meds: . albuterol  2.5 mg Nebulization Q6H  . antiseptic oral rinse  7 mL Mouth Rinse QID  . aspirin  81 mg Per Tube Daily  . atorvastatin  20 mg Per Tube q1800  . chlorhexidine  15 mL Mouth Rinse BID  . [START ON 01/15/2014] etomidate  40 mg Intravenous Once  . feeding supplement (VITAL HIGH PROTEIN)  1,000 mL Per Tube Q24H  . [START ON 01/15/2014] fentaNYL  200 mcg Intravenous Once  . heparin  5,000 Units Subcutaneous 3 times per day  .  insulin aspart  0-15 Units Subcutaneous 6 times per day  . isosorbide mononitrate  30 mg Oral q morning - 10a  . [START ON 01/15/2014] midazolam  4 mg Intravenous Once  . pantoprazole sodium  40 mg Per Tube Q2200  . [START ON 01/15/2014] propofol  5-80 mcg/kg/min Intravenous Once  . [START ON 01/15/2014] vecuronium  10 mg Intravenous Once    Continuous Infusions: . dexmedetomidine 0.3 mcg/kg/hr (01/14/14 0800)  . dextrose 40 mL/hr at 01/14/14 [place], RD, LDN, [place] Pager [phone] After Hours Pager [phone]

## 2014-01-14 NOTE — Progress Notes (Signed)
PULMONARY / CRITICAL CARE MEDICINE   Name: Dale Taylor MRN: [id number] DOB: 10-Apr-1940    ADMISSION DATE:  12/31/2013 CONSULTATION DATE:  12/20  REFERRING MD :  FPTS  CHIEF COMPLAINT:  Respiratory failure    INITIAL PRESENTATION:  74 yo male CRI, prostate ca admitted with A on C renal failure, hyperkalemia, HCAP, UTI.  PCCM assumed care in ICU.  STUDIES:  12/21 TTE >> EF 40 to 93%, grade 1 diastolic dysfx 71/69 Renal US >> negative 12/21 PAF with RVR. Amiodarone started. Converted to NSR. Cards consult obtained. Recommended continuing current rx. No anticoagulation given high risk 12/22 Increased dyspnea. White out of R hemithorax on CXR. Intubated. Copious purulent secretions from ETT.  12/22 CT chest >> extensive pleural thickening on R. R lung with reduced volume. Bilateral R>L AS dz 12/23 Copious purulent ET secretion. R lung better aerated. Cr improved 12/24 extubated 12/25 Add chest PT 12/27 : Feels more short of breath.s/p bronch -  Has diffuse tracheobronchomalacia and some yellow clear secretion on right side 01/07/14: Sitting on chair with face mask. Has progressive right lung collapse. Needing high flow o2 01/08/14: increased work of breathing -high o2 need. Persistent and worsening Rt lung collapse and REINTUBATED 01/09/14: Post intubation righ lung opened up. On 40% fio2. WUA in progress - on fent gtt + precedex gtt  01/10/14: no change. Failed SBT 01/11/14: Failed SBT. 01/12/14: Doing PSV. On Chest PT. Family meeting - desire to go ahead with Lurline Idol, [facility]   SUBJECTIVE/OVERNIGHT/INTERVAL HX 01/14/14: brady overnight, reduced lopressor to 12.5 bid, decreased precedex dose. K+ 6.3.   VITAL SIGNS: Temp:  [97.3 F (36.3 C)-97.9 F (36.6 C)] 97.5 F (36.4 C) (01/04 0423) Pulse Rate:  [50-58] 58 (01/04 0600) Resp:  [6-24] 19 (01/04 0600) BP: (95-138)/(40-66) 117/53 mmHg (01/04 0600) SpO2:  [98 %-100 %] 100 % (01/04 0600) FiO2 (%):  [40 %] 40 % (01/04 0420) Weight:   [86.3 kg (190 lb 4.1 oz)] 86.3 kg (190 lb 4.1 oz) (01/04 0452) INTAKE / OUTPUT:  Intake/Output Summary (Last 24 hours) at 01/14/14 0654 Last data filed at 01/14/14 0600  Gross per 24 hour  Intake 2482.43 ml  Output   3650 ml  Net -1167.57 ml    PHYSICAL EXAMINATION: General: in bed. Awake and following commands. In chair Neuro :RASS 0. on precedex gtt + fent PRn. Follows commands + HEENT: no sinus tenderness Cardiovascular: regular, bradycardic. Lungs: rhonchi anteriorly bilaterally Abdomen:  Obese, soft, +bs, suprapubic catheter intact  Ext:  No edema  LABS: PULMONARY  Recent Labs Lab 01/08/14 1306  PHART 7.178*  PCO2ART 71.3*  PO2ART 86.0  HCO3 26.4*  TCO2 29  O2SAT 93.0    CBC  Recent Labs Lab 01/12/14 0419 01/13/14 0430 01/14/14 0500  HGB 9.8* 9.7* 9.4*  HCT 31.1* 31.3* 30.8*  WBC 7.5 7.6 8.1  PLT 556* 564* 569*    COAGULATION No results for input(s): INR in the last 168 hours.  CARDIAC  No results for input(s): TROPONINI in the last 168 hours. No results for input(s): PROBNP in the last 168 hours.   CHEMISTRY  Recent Labs Lab 01/08/14 0415 01/09/14 0400  01/10/14 0430 01/11/14 0400 01/12/14 0419 01/13/14 0430 01/14/14 0500  NA 141 143  --  143 146*  --   --  147*  K 4.7 4.3  --  4.0 4.7  --   --  6.3*  CL 107 110  --  108 110  --   --  107  CO2 25 29  --  27 29  --   --  34*  GLUCOSE 103* 141*  --  138* 196*  --   --  176*  BUN 28* 37*  --  42* 46*  --   --  53*  CREATININE 1.52* 1.75*  --  1.45* 1.41*  --   --  1.30  CALCIUM 8.7 8.4  --  8.3* 8.5  --   --  9.0  MG 1.8 2.2  --  1.9 1.6 1.7 1.8 2.0  PHOS 3.0 <1.0*  < > 2.3 2.2* 2.4 3.2 3.6  < > = values in this interval not displayed. Estimated Creatinine Clearance: 53.9 mL/min (by C-G formula based on Cr of 1.3).   LIVER No results for input(s): AST, ALT, ALKPHOS, BILITOT, PROT, ALBUMIN, INR in the last 168 hours.   INFECTIOUS No results for input(s): LATICACIDVEN, PROCALCITON  in the last 168 hours.   ENDOCRINE CBG (last 3)   Recent Labs  01/13/14 1916 01/13/14 2350 01/14/14 0421  GLUCAP 188* 230* 152*    IMAGING x48h Dg Chest Port 1 View  01/13/2014   CLINICAL DATA:  Acute respiratory failure.  EXAM: PORTABLE CHEST - 1 VIEW  COMPARISON:  01/11/2014  FINDINGS: Support apparatus: Endotracheal tube tip 4.5 cm above carina. Nasogastric tube extends beyond the inferior aspect of the film. Left internal jugular line tip high SVC.  Cardiomediastinal silhouette: Surgical changes in the right suprahilar region. Normal heart size.  Pleura: Moderate fat hemidiaphragm elevation, secondary to volume loss in the right hemi thorax. Left costophrenic angle minimally excluded. No pleural effusion or pneumothorax.  Lungs: Right base atelectasis. Improvement in right upper lobe/ apical aeration.  Other: None  IMPRESSION: Improved right upper lobe aeration. Postoperative volume loss in the right hemi thorax, without acute superimposed process.   Electronically Signed   By: Abigail Miyamoto M.D.   On: 01/13/2014 08:31    ASSESSMENT / PLAN:  PULMONARY ETT 12/22 >> 12/24, 01/08/14  A: Acute on chronic hypercarbic respiratory failure 2nd to PNA.  Intubated 12/22 - 12/24 and reintubated 01/08/14 Chronic R pleural thickening of unclear etiology. R lung collapse. S/p bronch 12/27 - diffuse tracheobronchomalacia  01/12/2014 and 01/13/14 -  Doing SBT but needs trach  P:   WUA as tolerated Plan for trch week of 01/14/14 Oxygen to keep SpO2 > 92% F/u CXR Continue bronchial hygiene; step up to vest and percussor Vent settings noted, sbt attempted cpap 5 ps5, PS increased  CARDIOVASCULAR Lt IJ CVL 12/22 >> A: Hx of CAD, HTN, HLD, chronic combined CHF, PVD. PAF > in sinus rhythm >> Not considered candidate for chronic anticoagulation.  01/13/13  - improving 3rd spacing Bradycardic overnight  P:  Hold outpt norvasc, hydralazine, ranexa for now Holding home amio, asam, plavix, lipitor,  since 01/06/14 but will restart asa, lipitor, and imdur but hold plavix in anitcipation of trach Hold lasix D/c metoprolol as HR low and BP under control. Dc Lenard Galloway - ad kayxlate, needs bmet in 6 hrs May need bicarb drip abg obtain , as if acidotic then would increase MV to correct  RENAL A: Hypernatremia hyperkalemia Acute kidney injury >> improved. Hx of BPH, prostate cancer, CKD.  01/14/14 - hyperK 6.3  P:   Get EKG for hyperK+. Stopped K+ in IVF.  Consider 10 units insulin+ampt d50 +amp calcium gluconate as brady  Repeat BMP around noon. Monitor renal fx, urine outpt, electrolytes Hold Proscar, flomax, tasigna for now [place], see  cvs Add d5w at 40 cc/hr  GASTROINTESTINAL A: Nutrition. Hx of GERD. P:   Protonix for SUP TF to continue kayxlate x 1  HEMATOLOGIC A: Anemia of chronic disease, critical illness. Hx of CML. Thrombocytopenia 2nd to sepsis >> resolved.  P:  DVT px: SQ heparin F/u CBC intermittently: PRBC for hgb < 7gm%  INFECTIOUS  Tracheal aspirate 01/08/14  A: Pneumonia. - HCAP - no microbial etiology identified, s/p 8 day cefepime ending 01/07/14  01/12/13 -  -so far culture negative. Appears to have poor clearance mechanism of lung  P:   Monitor off abx pcxr in am for rt base  ENDOCRINE A: DM 2 with renal complications. Hx of gout. P:   SSI Hold allopurinol for now If flare gout - then add steroids  NEUROLOGIC A: Acute metabolic encephalopathy >> resolved. Neuropathy.   -01/12/14 - on  precedex gtt. RASS 0. CAM-ICU negative for delirium  P:   PAD protocol -  precedex gtt + fent prn Monitor mental status Hold lyrica for now May need to dc precedex if brady worsens   Dellia Nims, M.D PGY-1 Internal Medicine Pager [phone]  01/14/2014 6:54 AM   STAFF NOTE: Linwood Dibbles, MD FACP have personally reviewed patient's available data, including medical history, events of note, physical examination and test results as part  of my evaluation. I have discussed with resident/NP and other care providers such as pharmacist, RN and RRT. In addition, I personally evaluated patient and elicited key findings of: wean ps 8 needed, coarse rt, trach tues planned, treat K aggressive, repeat bmet, follow for BM, may need bicarb The patient is critically ill with multiple organ systems failure and requires high complexity decision making for assessment and support, frequent evaluation and titration of therapies, application of advanced monitoring technologies and extensive interpretation of multiple databases.   Critical Care Time devoted to patient care services described in this note is30  Minutes. This time reflects time of care of this signee: Merrie Roof, MD FACP. This critical care time does not reflect procedure time, or teaching time or supervisory time of PA/NP/Med student/Med Resident etc but could involve care discussion time. Rest per NP/medical resident whose note is outlined above and that I agree with   Lavon Paganini. Titus Mould, MD, Joice Pgr: [facility] 01/14/2014 9:36 AM

## 2014-01-14 NOTE — Progress Notes (Signed)
Placed back in full support due to inc WOB, RR 40's

## 2014-01-14 NOTE — Progress Notes (Signed)
Physical Therapy Treatment Patient Details Name: Patrik Turnbaugh MRN: [id number] DOB: Jul 13, 1940 Today's Date: 01/14/2014    History of Present Illness Edin Kon is a 74 y.o. male presenting with acute renal failure according to patient's nursing facility. PMH is significant for hypertension, Type 2 diabetes, chronic diastolic heart failure, hyperkalemia, HLD, prostate cancer, iron deficiency anemia, A. fib with RVR. Intubated 12/22, Extubated 12/24. Re-intubated 12/29.    PT Comments    Pt pleasant and moving better than last time this therapist saw him and better than last session. Pt with excessive saliva despite oral and ETT suction during session. Pt fatigued with activity on CPAP and unable to tolerate further activity or exercise at this time. Will continue to follow to maximize balance, strength and function. With goals updated secondary to respiratory distress and medical complication with prolonged hospitalization.   Follow Up Recommendations  LTACH     Equipment Recommendations       Recommendations for Other Services       Precautions / Restrictions Precautions Precautions: Fall Precaution Comments: vent, panda Restrictions Weight Bearing Restrictions: No    Mobility  Bed Mobility Overal bed mobility: Needs Assistance;+ 2 for safety/equipment Bed Mobility: Supine to Sit     Supine to sit: Min assist     General bed mobility comments: cues for sequence with assist to manage lines and min assist to fully scoot to EOB  Transfers Overall transfer level: Needs assistance   Transfers: Sit to/from Stand;Stand Pivot Transfers Sit to Stand: Mod assist;+2 safety/equipment Stand pivot transfers: Max assist;+2 physical assistance       General transfer comment: cues for sequence with assist of pad and elevated surface to stand. Pt initially fully extended then returned to squat posture with max cues and assist to block Right knee to fully pivot to chair prior to  sitting  Ambulation/Gait                 Stairs            Wheelchair Mobility    Modified Rankin (Stroke Patients Only)       Balance Overall balance assessment: Needs assistance   Sitting balance-Leahy Scale: Fair       Standing balance-Leahy Scale: Poor                      Cognition Arousal/Alertness: Awake/alert Behavior During Therapy: Flat affect Overall Cognitive Status: Difficult to assess Area of Impairment: Following commands       Following Commands: Follows one step commands consistently            Exercises      General Comments        Pertinent Vitals/Pain Pain Assessment: No/denies pain  ETT maintained at 24cm throughout 100% on CPAP with fiO2 of 40%, peep 5 144/59 (80) HR 98    Home Living                      Prior Function            PT Goals (current goals can now be found in the care plan section) Acute Rehab PT Goals Patient Stated Goal: be able to walk Time For Goal Achievement: 01/28/14 Potential to Achieve Goals: Fair Progress towards PT goals: Progressing toward goals (goals updated)    Frequency       PT Plan Discharge plan needs to be updated    Co-evaluation  End of Session Equipment Utilized During Treatment: Gait belt;Other (comment) (vent) Activity Tolerance: Patient limited by fatigue Patient left: in chair;with call bell/phone within reach;Other (comment) (lift pad under pt for return to bed)     Time: 9987-2158 PT Time Calculation (min) (ACUTE ONLY): 20 min  Charges:  $Therapeutic Activity: 8-22 mins                    G Codes:      Melford Aase Feb 10, 2014, 9:58 AM Elwyn Reach, [place]

## 2014-01-15 ENCOUNTER — Inpatient Hospital Stay (HOSPITAL_COMMUNITY): Payer: Medicare HMO

## 2014-01-15 ENCOUNTER — Encounter (HOSPITAL_COMMUNITY): Payer: Self-pay | Admitting: Internal Medicine

## 2014-01-15 DIAGNOSIS — J9600 Acute respiratory failure, unspecified whether with hypoxia or hypercapnia: Secondary | ICD-10-CM

## 2014-01-15 DIAGNOSIS — R4020 Unspecified coma: Secondary | ICD-10-CM

## 2014-01-15 LAB — CBC WITH DIFFERENTIAL/PLATELET
Basophils Absolute: 0 10*3/uL (ref 0.0–0.1)
Basophils Relative: 0 % (ref 0–1)
EOS PCT: 1 % (ref 0–5)
Eosinophils Absolute: 0.1 10*3/uL (ref 0.0–0.7)
HEMATOCRIT: 27.5 % — AB (ref 39.0–52.0)
Hemoglobin: 8.4 g/dL — ABNORMAL LOW (ref 13.0–17.0)
LYMPHS ABS: 1.5 10*3/uL (ref 0.7–4.0)
LYMPHS PCT: 14 % (ref 12–46)
MCH: 27.5 pg (ref 26.0–34.0)
MCHC: 30.5 g/dL (ref 30.0–36.0)
MCV: 89.9 fL (ref 78.0–100.0)
Monocytes Absolute: 1.2 10*3/uL — ABNORMAL HIGH (ref 0.1–1.0)
Monocytes Relative: 12 % (ref 3–12)
Neutro Abs: 7.8 10*3/uL — ABNORMAL HIGH (ref 1.7–7.7)
Neutrophils Relative %: 73 % (ref 43–77)
PLATELETS: 522 10*3/uL — AB (ref 150–400)
RBC: 3.06 MIL/uL — ABNORMAL LOW (ref 4.22–5.81)
RDW: 17.3 % — ABNORMAL HIGH (ref 11.5–15.5)
WBC: 10.6 10*3/uL — AB (ref 4.0–10.5)

## 2014-01-15 LAB — BASIC METABOLIC PANEL
Anion gap: 7 (ref 5–15)
Anion gap: 8 (ref 5–15)
BUN: 49 mg/dL — AB (ref 6–23)
BUN: 42 mg/dL — ABNORMAL HIGH (ref 6–23)
CALCIUM: 8.4 mg/dL (ref 8.4–10.5)
CALCIUM: 8.5 mg/dL (ref 8.4–10.5)
CHLORIDE: 115 meq/L — AB (ref 96–112)
CO2: 31 mmol/L (ref 19–32)
CO2: 28 mmol/L (ref 19–32)
Chloride: 111 mEq/L (ref 96–112)
Creatinine, Ser: 1.51 mg/dL — ABNORMAL HIGH (ref 0.50–1.35)
Creatinine, Ser: 1.46 mg/dL — ABNORMAL HIGH (ref 0.50–1.35)
GFR calc Af Amer: 51 mL/min — ABNORMAL LOW (ref >90)
GFR calc Af Amer: 53 mL/min — ABNORMAL LOW (ref >90)
GFR calc non Af Amer: 46 mL/min — ABNORMAL LOW (ref >90)
GFR, EST NON AFRICAN AMERICAN: 44 mL/min — AB (ref >90)
Glucose, Bld: 190 mg/dL — ABNORMAL HIGH (ref 70–99)
Glucose, Bld: 157 mg/dL — ABNORMAL HIGH (ref 70–99)
Potassium: 4.9 mmol/L (ref 3.5–5.1)
Potassium: 4.5 mmol/L (ref 3.5–5.1)
SODIUM: 149 mmol/L — AB (ref 135–145)
SODIUM: 151 mmol/L — AB (ref 135–145)

## 2014-01-15 LAB — CBC
HCT: 27.4 % — ABNORMAL LOW (ref 39.0–52.0)
HEMOGLOBIN: 8.5 g/dL — AB (ref 13.0–17.0)
MCH: 28.1 pg (ref 26.0–34.0)
MCHC: 31 g/dL (ref 30.0–36.0)
MCV: 90.7 fL (ref 78.0–100.0)
Platelets: 500 10*3/uL — ABNORMAL HIGH (ref 150–400)
RBC: 3.02 MIL/uL — AB (ref 4.22–5.81)
RDW: 17.3 % — ABNORMAL HIGH (ref 11.5–15.5)
WBC: 9.1 10*3/uL (ref 4.0–10.5)

## 2014-01-15 LAB — CLOSTRIDIUM DIFFICILE BY PCR: Toxigenic C. Difficile by PCR: NEGATIVE

## 2014-01-15 LAB — GLUCOSE, CAPILLARY
GLUCOSE-CAPILLARY: 166 mg/dL — AB (ref 70–99)
Glucose-Capillary: 172 mg/dL — ABNORMAL HIGH (ref 70–99)
Glucose-Capillary: 131 mg/dL — ABNORMAL HIGH (ref 70–99)
Glucose-Capillary: 126 mg/dL — ABNORMAL HIGH (ref 70–99)
Glucose-Capillary: 144 mg/dL — ABNORMAL HIGH (ref 70–99)
Glucose-Capillary: 123 mg/dL — ABNORMAL HIGH (ref 70–99)

## 2014-01-15 LAB — MAGNESIUM: Magnesium: 2 mg/dL (ref 1.5–2.5)

## 2014-01-15 LAB — PHOSPHORUS: PHOSPHORUS: 4 mg/dL (ref 2.3–4.6)

## 2014-01-15 MED ORDER — DOPAMINE-DEXTROSE 3.2-5 MG/ML-% IV SOLN
5.0000 ug/kg/min | INTRAVENOUS | Status: DC
  Filled 2014-01-15: qty 250

## 2014-01-15 NOTE — Progress Notes (Addendum)
PULMONARY / CRITICAL CARE MEDICINE   Name: Dale Taylor MRN: [id number] DOB: 1940/12/21    ADMISSION DATE:  12/13/2013 CONSULTATION DATE:  12/20  REFERRING MD :  FPTS  CHIEF COMPLAINT:  Respiratory failure    INITIAL PRESENTATION:  74 yo male CRI, prostate ca admitted with A on C renal failure, hyperkalemia, HCAP, UTI.  PCCM assumed care in ICU.  STUDIES:  12/21 TTE >> EF 40 to 33%, grade 1 diastolic dysfx 29/51 Renal US >> negative 12/21 PAF with RVR. Amiodarone started. Converted to NSR. Cards consult obtained. Recommended continuing current rx. No anticoagulation given high risk 12/22 Increased dyspnea. White out of R hemithorax on CXR. Intubated. Copious purulent secretions from ETT.  12/22 CT chest >> extensive pleural thickening on R. R lung with reduced volume. Bilateral R>L AS dz  Mucus plugging 12/25. 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 Progressive post extubation rigt lung collapse 01/08/14 and reintubated. NEEDS TRACH - family and patient on bord. Plavix on hold since 01/06/14    01/12/14: Doing PSV. On Chest PT. Family meeting - desire to go ahead with Lurline Idol, [place] 01/14/14: hyper K+, brady, d/ced lopressor. Treated hyperK+.  SUBJECTIVE/OVERNIGHT/INTERVAL HX No events overnight. K+ improved. Some attempt to pull out tube last night, placed on mittens.  Was placed on cdiff precaution but BM x4 was due to kayex.  VITAL SIGNS: Temp:  [98 F (36.7 C)-98.9 F (37.2 C)] 98.8 F (37.1 C) (01/05 0401) Pulse Rate:  [64-139] 64 (01/05 0700) Resp:  [0-30] 24 (01/05 0700) BP: (103-144)/(38-95) 132/54 mmHg (01/05 0700) SpO2:  [89 %-100 %] 100 % (01/05 0700) FiO2 (%):  [40 %] 40 % (01/05 0421) Weight:  [81.8 kg (180 lb 5.4 oz)] 81.8 kg (180 lb 5.4 oz) (01/05 0450) INTAKE / OUTPUT:  Intake/Output Summary (Last 24 hours) at 01/15/14 0720 Last data filed at 01/15/14 0700  Gross per 24 hour  Intake 2843.45 ml  Output   1355 ml  Net 1488.45 ml     PHYSICAL EXAMINATION: General: in bed. Awake and following commands. Neuro :RASS 0. Follows commands.  HEENT: no sinus tenderness, eomi, perrla. Cardiovascular: regular rate and rhythm. Lungs:ctab. Abdomen:  Obese, soft, +bs, suprapubic catheter intact no tenderness. Ext:  No edema  LABS: PULMONARY  Recent Labs Lab 01/08/14 1306 01/14/14 1118  PHART 7.178* 7.377  PCO2ART 71.3* 55.8*  PO2ART 86.0 88.0  HCO3 26.4* 32.8*  TCO2 29 34  O2SAT 93.0 96.0    CBC  Recent Labs Lab 01/13/14 0430 01/14/14 0500 01/15/14 0445  HGB 9.7* 9.4* 8.4*  HCT 31.3* 30.8* 27.5*  WBC 7.6 8.1 10.6*  PLT 564* 569* 522*    COAGULATION  Recent Labs Lab 01/14/14 1600  INR 1.18    CARDIAC  No results for input(s): TROPONINI in the last 168 hours. No results for input(s): PROBNP in the last 168 hours.   CHEMISTRY  Recent Labs Lab 01/11/14 0400 01/12/14 0419 01/13/14 0430 01/14/14 0500 01/14/14 1600 01/14/14 2100 01/15/14 0445  NA 146*  --   --  147* 149* 147* 149*  K 4.7  --   --  6.3* 5.1 4.8 4.9  CL 110  --   --  107 111 111 111  CO2 29  --   --  34* 31 31 31   GLUCOSE 196*  --   --  176* 81 203* 190*  BUN 46*  --   --  53* 53* 54* 49*  CREATININE 1.41*  --   --  1.30  1.47* 1.55* 1.51*  CALCIUM 8.5  --   --  9.0 8.7 8.7 8.4  MG 1.6 1.7 1.8 2.0  --   --  2.0  PHOS 2.2* 2.4 3.2 3.6  --   --  4.0   Estimated Creatinine Clearance: 46.4 mL/min (by C-G formula based on Cr of 1.51).   LIVER  Recent Labs Lab 01/14/14 1600  INR 1.18     INFECTIOUS No results for input(s): LATICACIDVEN, PROCALCITON in the last 168 hours.   ENDOCRINE CBG (last 3)   Recent Labs  01/14/14 1908 01/15/14 0006 01/15/14 0358  GLUCAP 179* 166* 172*    IMAGING x48h Dg Chest Port 1 View  01/14/2014   CLINICAL DATA:  74 year old male with shortness of breath. High blood pressure. Subsequent encounter.  EXAM: PORTABLE CHEST - 1 VIEW  COMPARISON:  01/13/2014 in 06/15/2012 chest  x-ray.  FINDINGS: Post right lobectomy with right sided fine loss and mediastinal shift to the right with elevated right hemidiaphragm. Difficult by plain film examination to exclude superimposed infectious infiltrate or recurrent lung cancer. Findings are relatively similar to the 06/15/2012 exam.  Endotracheal tube tip unchanged approximately 3.8 cm above the expected level of the carina.  Left central line tip proximal superior vena caval level directed laterally.  Nasogastric tube courses below the diaphragm. Tip is not included on the present exam.  Pulmonary vascular congestion.  Prior CT detected pulmonary parenchymal changes involving the left upper lobe and left lower lobe not as well delineated on the present examination suggesting interval partial clearing.  No gross pneumothorax.  Rotated mediastinal.  IMPRESSION: Postsurgical changes right thorax as noted above.  Pulmonary vascular prominence.  Relatively recent CT detected pulmonary parenchymal changes involving the left upper lobe and left lower lobe are not as well delineated on the current examination suggesting interval partial clearing.   Electronically Signed   By: Dale Taylor M.D.   On: 01/14/2014 07:44    ASSESSMENT / PLAN:  PULMONARY ETT 12/22 >> 12/24, 01/08/14  A: Acute on chronic hypercarbic respiratory failure 2nd to PNA.  Intubated 12/22 - 12/24 and reintubated 01/08/14 Chronic R pleural thickening of unclear etiology. R lung collapse. S/p bronch 12/27 - diffuse tracheobronchomalacia  P:   WUA as tolerated Wean attempt cpap 5 ps 5 Post trach would consider CPAP PS then and avoid TC with collapse prior  Chest pt, dc  Trach today Oxygen to keep SpO2 > 92% abg reviewed keep same MV  CARDIOVASCULAR Lt IJ CVL 12/22 >> A: Hx of CAD, HTN, HLD, chronic combined CHF, PVD. PAF > in sinus rhythm >> Not considered candidate for chronic anticoagulation.  BP stable overnight, HR remains in 60's, off metoprolol.  P:  Hold  outpt norvasc, hydralazine, ranexa for now. Also d/ced metoprolol 1/4. Holding home amio. Cont asa, lipitor, and imdur (changed to 20 mg TID isordil breakable form). hold plavix in anitcipation of trach today Hold lasix abg from yesterday shows improvement resp acidosis. Doesn't need bicarb drip at this time.  May need restart amio once HR increases  RENAL A: Hypernatremia - not improving Hyperkalemia - resolved with treatment. Acute kidney injury >> resolved/stable.  Hx of BPH, prostate cancer, CKD.  01/14/14 - hyperK 6.3 - resolved with treatment.  P:   Hold Proscar, flomax, tasigna for now Will change d5w rate to 80 cc/hr Monitor BMP q12 hr for NA  GASTROINTESTINAL A: Nutrition. Hx of GERD. P:   Protonix for SUP TF on hold trach kayxlate  x 1  HEMATOLOGIC A: Acute on chronic Anemia of chronic disease, critical illness.   hgb dropped to 8.4 from 9.4 Hx of CML. Thrombocytopenia 2nd to sepsis >> resolved.  P:  Monitor CBC q12hr (next 1500 today).  PRBC for hgb < 7gm% DVT px: SQ heparin coags wnl for trach  INFECTIOUS  Tracheal aspirate 01/08/14  A: Pneumonia. - HCAP - no microbial etiology identified, s/p 8 day cefepime ending 01/07/14  01/12/13 -  -so far culture negative. Appears to have poor clearance mechanism of lung cdiff sent but pending due to frequent BM's P:   Monitor off abx pcxr in am for rt base BMs likely due to kayex. I don't think he needs cdiff precaution but protocol rules  ENDOCRINE A: DM 2 with renal complications. Hx of gout. Hypoglycemia requring D50s P:   SSI  May change fluid to D10 if continues to be hypoglycemic on D5w. And rate increased d5w Hold allopurinol for now If flare gout - then add steroids Ensure LFT  NEUROLOGIC A: Acute metabolic encephalopathy worsening at night.  Neuropathy.   -01/12/14 - on  precedex gtt. RASS 0. CAM-ICU negative for delirium  P:   PAD protocol -  precedex gtt + fent prn Try to wean off  mitten/restraints if calm overnight. Monitor mental status - watch out for sundowning and use nonpharmacologic techniques to reduce delirium. Hold lyrica for now Dc precedex during trach   Dale Taylor, M.D PGY-1 Internal Medicine Pager [phone]  01/15/2014 7:20 AM   STAFF NOTE: Dale Dibbles, MD FACP have personally reviewed patient's available data, including medical history, events of note, physical examination and test results as part of my evaluation. I have discussed with resident/NP and other care providers such as pharmacist, RN and RRT. In addition, I personally evaluated patient and elicited key findings ZS:WFUX attempts, dc chest pt, pcxr post trach, d5w increase, K better, bmet in pm, coarse BS but present, calmer The patient is critically ill with multiple organ systems failure and requires high complexity decision making for assessment and support, frequent evaluation and titration of therapies, application of advanced monitoring technologies and extensive interpretation of multiple databases.   Critical Care Time devoted to patient care services described in this note is30  Minutes. This time reflects time of care of this signee: Dale Roof, MD FACP. This critical care time does not reflect procedure time, or teaching time or supervisory time of PA/NP/Med student/Med Resident etc but could involve care discussion time. Rest per NP/medical resident whose note is outlined above and that I agree with   Dale Taylor. Dale Mould, MD, [place] Pgr: [facility] 01/15/2014 11:27 AM

## 2014-01-15 NOTE — Procedures (Signed)
Name:  Dale Taylor MRN:  [id number] DOB:  October 04, 1940  OPERATIVE NOTE  Procedure:  Percutaneous tracheostomy.  Indications:  Ventilator-dependent respiratory failure.  Consent:  Procedure, alternatives, risks and benefits discussed with medical POA.  Questions answered.  Consent obtained.  Anesthesia: verse, fent, etomidate  Procedure summary:  Appropriate equipment was assembled.  The patient was identified as [name] and safety timeout was performed. The patient was placed in supine position with a towel roll behind shoulder blades and neck extended.  Sterile technique was used. The patient's neck and upper chest were prepped using chlorhexidine / alcohol scrub and the field was draped in usual sterile fashion with full body drape. After the adequate sedation / anesthesia was achieved, attention was directed at the midline trachea, where the cricothyroid membrane was palpated. Approximately two fingerbreadths above the sternal notch, a verticle incision was created with a scalpel after local infiltration with 0.2% Lidocaine. 2 small arteries were bleeding and required 3 O silk ligation with good hemostasis.  Then, using Seldinger technique and a percutaneous tracheostomy set, the trachea was entered with a 14 gauge needle with an overlying sheath. This was all confirmed under direct visualization of a fiberoptic flexible bronchoscope. Entrance into the trachea was identified through the third tracheal ring interspace. Following this, a guidewire was inserted. The needle was removed, leaving the sheath and the guidewire intact. Next, the sheath was removed and a small dilator was inserted. The tracheal rings were then dilated. A #6 Shiley was then opened. The balloon was checked. It was placed over a tracheal dilator, which was then advanced over the guidewire and through the previously dilated tract. The Shiley tracheostomy tube was noted to pass in the trachea with little resistance. The guidewire  and dilator tubes were removed from the trachea. An inner cannula was placed through the tracheostomy tube. The tracheostomy was then secured at the anterior neck with 4 monofilament sutures. The oral endotracheal tube was removed and the ventilator was attached to the newly placed tracheostomy tube. Adequate tidal volumes were noted. The cuff was inflated and no evidence of air leak was noted. No evidence of bleeding was noted. At this point, the procedure was concluded. Post-procedure chest x-ray was ordered.  Complications:  No immediate complications were noted.  Hemodynamic parameters and oxygenation remained stable throughout the procedure.  Estimated blood loss:  Less then 25 mL.  Raylene Miyamoto., MD Pulmonary and [place] Pager: [phone]  01/15/2014, 2:10 PM   Can follow up in trach clinic [phone]

## 2014-01-15 NOTE — Procedures (Signed)
Bedside Tracheostomy Insertion Procedure Note   Patient Details:   Name: Dale Taylor DOB: [birth year] MRN: [id number]  Procedure: Tracheostomy  Pre Procedure Assessment: ET Tube Size:7.0 ET Tube secured at lip (cm):21 Bite block in place: No Breath Sounds: Clear  Post Procedure Assessment: BP 120/58 mmHg  Pulse 71  Temp(Src) 98.5 F (36.9 C) (Oral)  Resp 19  Ht 5\' 11"  (1.803 m)  Wt 180 lb 5.4 oz (81.8 kg)  BMI 25.16 kg/m2  SpO2 100% O2 sats: stable throughout Complications: No apparent complications Patient did tolerate procedure well Tracheostomy Brand:Shiley Tracheostomy Style:Cuffed Tracheostomy Size: 6.0 Tracheostomy Secured DXA:JOINOMV, velcro Tracheostomy Placement Confirmation:Trach cuff visualized and in place, cxr taken for placement    Readling, Tammie Ann 01/15/2014, 2:50 PM       Merton Border, MD ; [facility] service Mobile [phone].  After 5:30 PM or weekends, call [phone]

## 2014-01-15 NOTE — Procedures (Signed)
Bronchoscopy  for Percutaneous  Tracheostomy  Name: Dale Taylor MRN: [id number] DOB: 1940-12-02 Procedure: Bronchoscopy for Percutaneous Tracheostomy Indications: Diagnostic evaluation of the airways In conjunction with: Dr. Titus Mould   Procedure Details Consent: Risks of procedure as well as the alternatives and risks of each were explained to the (patient/caregiver).  Consent for procedure obtained. Time Out: Verified patient identification, verified procedure, site/side was marked, verified correct patient position, special equipment/implants available, medications/allergies/relevent history reviewed, required imaging and test results available.  Performed  In preparation for procedure, patient was given 100% FiO2 and bronchoscope lubricated. Sedation: Benzodiazepines, Muscle relaxants and Etomidate  Airway entered and the following bronchi were examined: RML.   Procedures performed: Endotracheal Tube retracted in 2 cm increments. Cannulation of airway observed. Dilation observed. Placement of trachel tube  observed . No overt complications. Bronchoscope removed.    Evaluation Hemodynamic Status: Transient hypotension treated with fluid; O2 sats: stable throughout Patient's Current Condition: stable Specimens:  None Complications: No apparent complications Patient did tolerate procedure well.   Richardson Landry Minor ACNP Maryanna Shape PCCM Pager [phone] till 3 pm If no answer page [phone] 01/15/2014, 1:56 PM  I was present for and supervised the entire procedure  Merton Border, MD ; [facility] - Inpatient service Mobile [phone].  After 5:30 PM or weekends, call [phone]

## 2014-01-16 ENCOUNTER — Inpatient Hospital Stay (HOSPITAL_COMMUNITY): Payer: Medicare HMO

## 2014-01-16 DIAGNOSIS — R400 Somnolence: Secondary | ICD-10-CM

## 2014-01-16 LAB — URINALYSIS, ROUTINE W REFLEX MICROSCOPIC
BILIRUBIN URINE: NEGATIVE
Glucose, UA: NEGATIVE mg/dL
Ketones, ur: NEGATIVE mg/dL
NITRITE: NEGATIVE
Protein, ur: 30 mg/dL — AB
Specific Gravity, Urine: 1.01 (ref 1.005–1.030)
UROBILINOGEN UA: 1 mg/dL (ref 0.0–1.0)
pH: 6.5 (ref 5.0–8.0)

## 2014-01-16 LAB — COMPREHENSIVE METABOLIC PANEL
ALT: 37 U/L (ref 0–53)
AST: 32 U/L (ref 0–37)
Albumin: 2.4 g/dL — ABNORMAL LOW (ref 3.5–5.2)
Alkaline Phosphatase: 81 U/L (ref 39–117)
Anion gap: 6 (ref 5–15)
BUN: 43 mg/dL — AB (ref 6–23)
CHLORIDE: 111 meq/L (ref 96–112)
CO2: 27 mmol/L (ref 19–32)
CREATININE: 1.48 mg/dL — AB (ref 0.50–1.35)
Calcium: 8 mg/dL — ABNORMAL LOW (ref 8.4–10.5)
GFR calc non Af Amer: 45 mL/min — ABNORMAL LOW (ref >90)
GFR, EST AFRICAN AMERICAN: 52 mL/min — AB (ref >90)
Glucose, Bld: 198 mg/dL — ABNORMAL HIGH (ref 70–99)
Potassium: 5.3 mmol/L — ABNORMAL HIGH (ref 3.5–5.1)
SODIUM: 144 mmol/L (ref 135–145)
Total Bilirubin: 0.6 mg/dL (ref 0.3–1.2)
Total Protein: 4.8 g/dL — ABNORMAL LOW (ref 6.0–8.3)

## 2014-01-16 LAB — URINE MICROSCOPIC-ADD ON

## 2014-01-16 LAB — BASIC METABOLIC PANEL
ANION GAP: 6 (ref 5–15)
BUN: 39 mg/dL — ABNORMAL HIGH (ref 6–23)
CO2: 27 mmol/L (ref 19–32)
Calcium: 8.4 mg/dL (ref 8.4–10.5)
Chloride: 113 mEq/L — ABNORMAL HIGH (ref 96–112)
Creatinine, Ser: 1.49 mg/dL — ABNORMAL HIGH (ref 0.50–1.35)
GFR calc non Af Amer: 45 mL/min — ABNORMAL LOW (ref >90)
GFR, EST AFRICAN AMERICAN: 52 mL/min — AB (ref >90)
Glucose, Bld: 156 mg/dL — ABNORMAL HIGH (ref 70–99)
Potassium: 5.6 mmol/L — ABNORMAL HIGH (ref 3.5–5.1)
Sodium: 146 mmol/L — ABNORMAL HIGH (ref 135–145)

## 2014-01-16 LAB — GLUCOSE, CAPILLARY
GLUCOSE-CAPILLARY: 146 mg/dL — AB (ref 70–99)
Glucose-Capillary: 125 mg/dL — ABNORMAL HIGH (ref 70–99)
Glucose-Capillary: 137 mg/dL — ABNORMAL HIGH (ref 70–99)
Glucose-Capillary: 146 mg/dL — ABNORMAL HIGH (ref 70–99)
Glucose-Capillary: 185 mg/dL — ABNORMAL HIGH (ref 70–99)
Glucose-Capillary: 224 mg/dL — ABNORMAL HIGH (ref 70–99)

## 2014-01-16 LAB — CBC
HEMATOCRIT: 26.3 % — AB (ref 39.0–52.0)
HEMOGLOBIN: 8.2 g/dL — AB (ref 13.0–17.0)
MCH: 29.1 pg (ref 26.0–34.0)
MCHC: 31.2 g/dL (ref 30.0–36.0)
MCV: 93.3 fL (ref 78.0–100.0)
Platelets: 432 10*3/uL — ABNORMAL HIGH (ref 150–400)
RBC: 2.82 MIL/uL — ABNORMAL LOW (ref 4.22–5.81)
RDW: 17.1 % — ABNORMAL HIGH (ref 11.5–15.5)
WBC: 8.5 10*3/uL (ref 4.0–10.5)

## 2014-01-16 MED ORDER — HYDROMORPHONE HCL 1 MG/ML IJ SOLN
1.0000 mg | INTRAMUSCULAR | Status: DC | PRN
  Administered 2014-01-16 – 2014-01-21 (×17): 1 mg via INTRAVENOUS
  Filled 2014-01-16 (×17): qty 1

## 2014-01-16 MED ORDER — DEXTROSE 5 % IV SOLN
1.0000 g | Freq: Two times a day (BID) | INTRAVENOUS | Status: DC
  Administered 2014-01-16 – 2014-01-17 (×2): 1 g via INTRAVENOUS
  Filled 2014-01-16 (×3): qty 1

## 2014-01-16 MED ORDER — FENTANYL CITRATE 0.05 MG/ML IJ SOLN
50.0000 ug | INTRAMUSCULAR | Status: DC | PRN
  Administered 2014-01-16: 200 ug via INTRAVENOUS
  Administered 2014-01-16 (×4): 100 ug via INTRAVENOUS
  Administered 2014-01-16 – 2014-01-17 (×3): 200 ug via INTRAVENOUS
  Administered 2014-01-17 (×4): 100 ug via INTRAVENOUS
  Administered 2014-01-17: 200 ug via INTRAVENOUS
  Administered 2014-01-17 – 2014-01-18 (×3): 100 ug via INTRAVENOUS
  Filled 2014-01-16 (×3): qty 4
  Filled 2014-01-16 (×5): qty 2
  Filled 2014-01-16: qty 4
  Filled 2014-01-16 (×4): qty 2
  Filled 2014-01-16 (×3): qty 4

## 2014-01-16 MED ORDER — HYDROMORPHONE HCL 1 MG/ML IJ SOLN
1.0000 mg | Freq: Once | INTRAMUSCULAR | Status: AC
  Administered 2014-01-16: 1 mg via INTRAVENOUS
  Filled 2014-01-16: qty 1

## 2014-01-16 MED ORDER — EPINEPHRINE HCL 0.1 MG/ML IJ SOSY
PREFILLED_SYRINGE | INTRAMUSCULAR | Status: AC
  Filled 2014-01-16: qty 10

## 2014-01-16 MED ORDER — ACETAMINOPHEN 325 MG PO TABS: 650.0000 mg | ORAL_TABLET | ORAL | Status: DC | PRN

## 2014-01-16 MED ORDER — SODIUM CHLORIDE 0.45 % IV BOLUS
250.0000 mL | Freq: Once | INTRAVENOUS | Status: AC
  Administered 2014-01-16: 250 mL via INTRAVENOUS

## 2014-01-16 MED ORDER — MIDAZOLAM HCL 2 MG/2ML IJ SOLN
INTRAMUSCULAR | Status: AC
  Administered 2014-01-16: 2 mg
  Filled 2014-01-16: qty 2

## 2014-01-16 MED ORDER — ACETYLCYSTEINE 20 % IN SOLN
4.0000 mL | Freq: Two times a day (BID) | RESPIRATORY_TRACT | Status: AC
  Administered 2014-01-16 – 2014-01-20 (×9): 4 mL via RESPIRATORY_TRACT
  Filled 2014-01-16 (×9): qty 4

## 2014-01-16 MED ORDER — VANCOMYCIN HCL IN DEXTROSE 750-5 MG/150ML-% IV SOLN
750.0000 mg | Freq: Two times a day (BID) | INTRAVENOUS | Status: DC
  Administered 2014-01-17: 750 mg via INTRAVENOUS
  Filled 2014-01-16 (×2): qty 150

## 2014-01-16 MED ORDER — EPINEPHRINE HCL 1 MG/ML IJ SOLN
INTRAMUSCULAR | Status: DC
Start: 2014-01-16 — End: 2014-01-16
  Filled 2014-01-16: qty 1

## 2014-01-16 MED ORDER — ACETYLCYSTEINE 20 % IN SOLN
4.0000 mL | Freq: Two times a day (BID) | RESPIRATORY_TRACT | Status: DC
  Administered 2014-01-16: 4 mL via RESPIRATORY_TRACT
  Filled 2014-01-16 (×2): qty 4

## 2014-01-16 MED ORDER — SODIUM POLYSTYRENE SULFONATE 15 GM/60ML PO SUSP
30.0000 g | Freq: Once | ORAL | Status: AC
  Administered 2014-01-16: 30 g via ORAL
  Filled 2014-01-16: qty 120

## 2014-01-16 MED ORDER — VANCOMYCIN HCL 10 G IV SOLR
1500.0000 mg | Freq: Once | INTRAVENOUS | Status: AC
  Administered 2014-01-16: 1500 mg via INTRAVENOUS
  Filled 2014-01-16: qty 1500

## 2014-01-16 MED ORDER — MIDAZOLAM HCL 2 MG/2ML IJ SOLN
1.0000 mg | Freq: Once | INTRAMUSCULAR | Status: AC
  Administered 2014-01-16: 1 mg via INTRAVENOUS
  Filled 2014-01-16: qty 2

## 2014-01-16 MED ORDER — FENTANYL CITRATE 0.05 MG/ML IJ SOLN
INTRAMUSCULAR | Status: AC
  Administered 2014-01-16: 200 ug
  Filled 2014-01-16: qty 4

## 2014-01-16 MED ORDER — ETOMIDATE 2 MG/ML IV SOLN
INTRAVENOUS | Status: AC
  Administered 2014-01-16: 20 mg via INTRAVENOUS
  Filled 2014-01-16: qty 10

## 2014-01-16 NOTE — Progress Notes (Signed)
Dr. Titus Mould bronched pt.  Pt on 100% fio2 t/o procedure.  Sat98-100% t/o procedure.  Post bronch, fio2 returned to 40%.  MD aware of cuff leak.  Bronch samples x2 labeled and walked to lab with appropriate requisition.

## 2014-01-16 NOTE — Progress Notes (Addendum)
PULMONARY / CRITICAL CARE MEDICINE   Name: Dale Taylor MRN: [id number] DOB: [birth year]    ADMISSION DATE:  01/08/2014 CONSULTATION DATE:  12/20  REFERRING MD :  FPTS  CHIEF COMPLAINT:  Respiratory failure    INITIAL PRESENTATION:  74 yo male CRI, prostate ca admitted with A on C renal failure, hyperkalemia, HCAP, UTI.  PCCM assumed care in ICU.  STUDIES/events:  12/21 TTE >> EF 40 to 25%, grade 1 diastolic dysfx 85/27 Renal US >> negative 12/21 PAF with RVR. Amiodarone started. Converted to NSR. Cards consult obtained. Recommended continuing current rx. No anticoagulation given high risk 12/22 Increased dyspnea. White out of R hemithorax on CXR. Intubated. Copious purulent secretions from ETT.  12/22 CT chest >> extensive pleural thickening on R. R lung with reduced volume. Bilateral R>L AS dz 01/15/14: CXR worsening collapse of RUL and RLL.  Events: Mucus plugging 12/25. 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 Progressive post extubation rigt lung collapse 01/08/14 and reintubated. NEEDS TRACH - family and patient on bord. Plavix on hold since 01/06/14    01/12/14: Doing PSV. On Chest PT. Family meeting - desire to go ahead with Lurline [place], [place] 01/14/14: hyper K+, brady, d/ced lopressor. Treated hyperK+. 01/15/14: placed trach. Treating hypernatremia. hgb overall stable but trending.   SUBJECTIVE/OVERNIGHT/INTERVAL HX Sodium improving. hgb stable.   VITAL SIGNS: Temp:  [97.5 F (36.4 C)-98.7 F (37.1 C)] 98.4 F (36.9 C) (01/06 0414) Pulse Rate:  [51-115] 56 (01/06 0500) Resp:  [0-29] 24 (01/06 0500) BP: (79-153)/(42-78) 127/54 mmHg (01/06 0500) SpO2:  [98 %-100 %] 100 % (01/06 0500) FiO2 (%):  [40 %-60 %] 40 % (01/06 0351) Weight:  [85 kg (187 lb 6.3 oz)] 85 kg (187 lb 6.3 oz) (01/06 0500) INTAKE / OUTPUT:  Intake/Output Summary (Last 24 hours) at 01/16/14 0635 Last data filed at 01/16/14 0500  Gross per 24 hour  Intake 3490.79 ml  Output   1635 ml   Net 1855.79 ml    PHYSICAL EXAMINATION: General: in bed. Sleeping but  Awakes and following commands. Neuro :RASS 0. Follows commands.  HEENT: no sinus tenderness, eomi, perrla. Trach site normal. Cardiovascular: regular rate and rhythm. Lungs:ctab. Abdomen:  Obese, soft, +bs, suprapubic catheter intact no tenderness. Ext:  No edema  LABS: PULMONARY  Recent Labs Lab 01/14/14 1118  PHART 7.377  PCO2ART 55.8*  PO2ART 88.0  HCO3 32.8*  TCO2 34  O2SAT 96.0    CBC  Recent Labs Lab 01/15/14 0445 01/15/14 1500 01/16/14 0315  HGB 8.4* 8.5* 8.2*  HCT 27.5* 27.4* 26.3*  WBC 10.6* 9.1 8.5  PLT 522* 500* 432*    COAGULATION  Recent Labs Lab 01/14/14 1600  INR 1.18    CARDIAC  No results for input(s): TROPONINI in the last 168 hours. No results for input(s): PROBNP in the last 168 hours.   CHEMISTRY  Recent Labs Lab 01/11/14 0400 01/12/14 0419 01/13/14 0430 01/14/14 0500 01/14/14 1600 01/14/14 2100 01/15/14 0445 01/15/14 1600 01/16/14 0500  NA 146*  --   --  147* 149* 147* 149* 151* 144  K 4.7  --   --  6.3* 5.1 4.8 4.9 4.5 5.3*  CL 110  --   --  107 111 111 111 115* 111  CO2 29  --   --  34* 31 31 31 28 27   GLUCOSE 196*  --   --  176* 81 203* 190* 157* 198*  BUN 46*  --   --  53* 53* 54* 49* 42*  43*  CREATININE 1.41*  --   --  1.30 1.47* 1.55* 1.51* 1.46* 1.48*  CALCIUM 8.5  --   --  9.0 8.7 8.7 8.4 8.5 8.0*  MG 1.6 1.7 1.8 2.0  --   --  2.0  --   --   PHOS 2.2* 2.4 3.2 3.6  --   --  4.0  --   --    Estimated Creatinine Clearance: 47.3 mL/min (by C-G formula based on Cr of 1.48).   LIVER  Recent Labs Lab 01/14/14 1600 01/16/14 0500  AST  --  32  ALT  --  37  ALKPHOS  --  81  BILITOT  --  0.6  PROT  --  4.8*  ALBUMIN  --  2.4*  INR 1.18  --      INFECTIOUS No results for input(s): LATICACIDVEN, PROCALCITON in the last 168 hours.   ENDOCRINE CBG (last 3)   Recent Labs  01/15/14 1913 01/16/14 0021 01/16/14 0417  GLUCAP 123*  224* 185*    IMAGING x48h Dg Chest Port 1 View  01/15/2014   CLINICAL DATA:  respiratory failure.  Tracheostomy placement  EXAM: PORTABLE CHEST - 1 VIEW  COMPARISON:  01/14/2014  FINDINGS: Interval placement of tracheostomy in good position.  Left jugular catheter tip in the SVC unchanged  Progression of right lower lobe collapse and effusion. There is also some right upper lobe collapse. Surgical clips in the right upper lobe. Left lung remains clear. No pneumothorax  Satisfactory  IMPRESSION: Satisfactory tracheostomy placement.  Worsening collapse in the right upper lobe and right lower lobe.   Electronically Signed   By: Franchot Gallo M.D.   On: 01/15/2014 14:38   Dg Abd Portable 1v  01/15/2014   CLINICAL DATA:  Check feeding tube placement  EXAM: PORTABLE ABDOMEN - 1 VIEW  COMPARISON:  01/08/2014  FINDINGS: The feeding tube is now seen extending into the stomach. Nonobstructive bowel gas pattern is noted. Patchy changes are again seen in the right lung base stable from the prior exam. There has been some improvement in the left lung when compared with the prior study.  IMPRESSION: Feeding catheter within the stomach.   Electronically Signed   By: Inez Catalina M.D.   On: 01/15/2014 17:52    ASSESSMENT / PLAN:  PULMONARY ETT 12/22 >> 12/24, 01/08/14  A: Acute on chronic hypercarbic respiratory failure 2nd to PNA.  Intubated 12/22 - 12/24 and reintubated 01/08/14 Chronic R pleural thickening of unclear etiology. R lung collapse. S/p bronch 12/27 - diffuse tracheobronchomalacia 01/15/14: trached.  P:   WUA as tolerated On trach try weaning to CPAP PS and avoid TC with collapse prior  Oxygen to keep SpO2 > 92% May require bronch for rt atx, repeat pcxr now PS 10 required thus far Add chest pt to rt again Mucomyst q12h addition  CARDIOVASCULAR Lt IJ CVL 12/22 >> A: Hx of CAD, HTN, HLD, chronic combined CHF, PVD. PAF > in sinus rhythm >> Not considered candidate for chronic  anticoagulation.  BP stable overnight al though was low post trach yesterday. Didn't require dopamine drip HR remains in 60's, off metoprolol.   P:  Hold outpt norvasc, hydralazine, ranexa for now. Also d/ced metoprolol 1/4. Holding home amio. Cont asa, lipitor Dc imdur (20 mg TID isordil breakable form). Never reqiored dop, Bp better  Restart plavix hold as some some oozing noted still May need restart amio once HR increases but hold for now.   RENAL  A: Hypernatremia - improving Hyperkalemia - resolved with treatment, but going up again. Acute kidney injury >> resolved/stable.  Hx of BPH, prostate cancer, CKD.  P:   Hold Proscar, flomax, tasigna for now Cont d5w 80cc/hr, maintain Monitor BMP q12 hr for NA and K+.  GASTROINTESTINAL A: Nutrition. Hx of GERD. P:   Protonix for SUP Cont TF Consider PEG  HEMATOLOGIC A: Acute on chronic Anemia of chronic disease, critical illness.   hgb dropped to 8.4 from 9.4 yesterday, now stable around 8.2 Hx of CML. Thrombocytopenia 2nd to sepsis >> resolved.  P:  Monitor CBC q12hr >> daily.  PRBC for hgb < 7gm% DVT px: SQ heparin coags wnl for trach Some oozing trach, holding plavix further  INFECTIOUS  Tracheal aspirate 01/08/14  A: Pneumonia. - HCAP - no microbial etiology identified, s/p 8 day cefepime ending 01/07/14  01/12/13 -  -cultures negative. Cdiff negative. P:   Monitor off abx. Afebrile.  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 Hypoglycemia - resolving with d5w. P:   SSI  Hold allopurinol for now If flare gout - then add steroids LFTs normal.  NEUROLOGIC A: Acute metabolic encephalopathy worsening at night.  Neuropathy.  P:   Monitor mental status - watch out for sundowning and use nonpharmacologic techniques to reduce delirium. Hold lyrica for now Try to wean off precedex.   Dellia Nims, M.D PGY-1 Internal Medicine Pager  [phone]  01/16/2014 6:35 AM   STAFF NOTE: Dale Dibbles, MD FACP have personally reviewed patient's available data, including medical history, events of note, physical examination and test results as part of my evaluation. I have discussed with resident/NP and other care providers such as pharmacist, RN and RRT. In addition, I personally evaluated patient and elicited key findings of:  PCXR now, r/o collapse,  May need bronch, wean PS ok, repeat pt, add mucomysts, chets pt back, cont d5w, corase reduce rt on exam The patient is critically ill with multiple organ systems failure and requires high complexity decision making for assessment and support, frequent evaluation and titration of therapies, application of advanced monitoring technologies and extensive interpretation of multiple databases.   Critical Care Time devoted to patient care services described in this note is30 Minutes. This time reflects time of care of this signee: Merrie Roof, MD FACP. This critical care time does not reflect procedure time, or teaching time or supervisory time of PA/NP/Med student/Med Resident etc but could involve care discussion time. Rest per NP/medical resident whose note is outlined above and that I agree with   Lavon Paganini. Titus Mould, MD, [place] Pgr: [facility] 01/16/2014 12:28 PM   Lavon Paganini. Titus Mould, MD, [place] Pgr: [facility]

## 2014-01-16 NOTE — Progress Notes (Signed)
PCCM progress note  S: Paged by RN that patient's temp 102.8, tachycardic and hypotensive. Evaluated pt at bedside. Reports headache.  O: Filed Vitals:   01/16/14 1830 01/16/14 1900 01/16/14 1940 01/16/14 1953  BP: 129/48 102/45  89/63  Pulse: 121 118  108  Temp:   102.8 F (39.3 C)   TempSrc:   Oral   Resp: 28 24  24   Height:      Weight:      SpO2: 98% 100%  100%   Gen: NAD Neuro: Awake and alert HEENT: trach in place Chest: Decreased BS on R, no wheezes/rhonchi/rales CV: RRR Abdomen: soft, nontender, nondistended Extr: bilateral LE edema 2+  BMP Latest Ref Rng 01/16/2014 01/16/2014 01/15/2014  Glucose 70 - 99 mg/dL 156(H) 198(H) 157(H)  BUN 6 - 23 mg/dL 39(H) 43(H) 42(H)  Creatinine 0.50 - 1.35 mg/dL 1.49(H) 1.48(H) 1.46(H)  Sodium 135 - 145 mmol/L 146(H) 144 151(H)  Potassium 3.5 - 5.1 mmol/L 5.6(H) 5.3(H) 4.5  Chloride 96 - 112 mEq/L 113(H) 111 115(H)  CO2 19 - 32 mmol/L 27 27 28   Calcium 8.4 - 10.5 mg/dL 8.4 8.0(L) 8.5   CXR 1/6 IMPRESSION: Increasing left lung interstitial opacities suspicious for edema.  Improved aeration of the right lower lung.  No evidence of pneumothorax.  Assessment/Plan:  1. SIRS - Febrile and tachycardic. Suspect either lung or urinary source. Vanc discontinued 12/21 and cefepime discontinued 12/28 (finished 8 day course) - blood culture x 2, urine culture - restart Vanc and cefepime - 234ml bolus of 0.45NS - has PAF, will obtain 12 lead EKG. May need restart amio.  2. Hyperkalemia - K increased to 5.6 this afternoon -Kayexalate 30g x 1  Jacques Earthly, MD

## 2014-01-16 NOTE — Procedures (Signed)
Bronchoscopy Procedure Note Fitz Matsuo [id number] [birth year]  Procedure: Bronchoscopy Indications: Diagnostic evaluation of the airways, Obtain specimens for culture and/or other diagnostic studies and Remove secretions  Procedure Details Consent: Risks of procedure as well as the alternatives and risks of each were explained to the (patient/caregiver).  Consent for procedure obtained. Time Out: Verified patient identification, verified procedure, site/side was marked, verified correct patient position, special equipment/implants available, medications/allergies/relevent history reviewed, required imaging and test results available.  Performed  In preparation for procedure, patient was given 100% FiO2 and bronchoscope lubricated. Sedation: Etomidate  Airway entered and the following bronchi were examined: RML, RLL, LUL, LLL and Bronchi.   Procedures performed: Brushings performed Bronchoscope removed.  , Patient placed back on 100% FiO2 at conclusion of procedure.    Evaluation Hemodynamic Status: BP stable throughout; O2 sats: stable throughout Patient's Current Condition: stable Specimens:  Sent serosanguinous fluid Complications: No apparent complications Patient did tolerate procedure well.   Raylene Miyamoto. 01/16/2014   1. COMPLETE RT MAIN COLLAPSE 2. SEVERAL SMALL LESIOS ANNULAR ANTERIOR WALL AT RT MAIN ANDCLOT OVER LESION AT bi AT DIVISION SUP SEG AND rll TAKE OFF 3. ACTIVE OOZING FROM LESION ( FROM SUCTION OR CANCER) EPI 1000 1% DILUTED 2 CC APPLIED , RESOLVED  4. Bal rll SEROUS ANGUIONOUS LEFT WNL  SENT FOR CYTOLOGY AND CULTURE  Lavon Paganini. Titus Mould, MD, [place] Pgr: [facility]

## 2014-01-16 NOTE — Progress Notes (Signed)
ANTIBIOTIC CONSULT NOTE - INITIAL  Pharmacy Consult for Cefepime and Vancomycin Indication: rule out sepsis  No Known Allergies  Patient Measurements: Height: 5\' 11"  (180.3 cm) Weight: 187 lb 6.3 oz (85 kg) IBW/kg (Calculated) : 75.3  Vital Signs: Temp: 102.8 F (39.3 C) (01/06 1940) Temp Source: Oral (01/06 1940) BP: 89/63 mmHg (01/06 1953) Pulse Rate: 108 (01/06 1953) Intake/Output from previous day: 01/05 0701 - 01/06 0700 In: 3817.2 [I.V.:2162.1; NG/GT:1655.1] Out: 2202 [Urine:1635] Intake/Output from this shift:    Labs:  Recent Labs  01/15/14 0445 01/15/14 1500 01/15/14 1600 01/16/14 0315 01/16/14 0500 01/16/14 1700  WBC 10.6* 9.1  --  8.5  --   --   HGB 8.4* 8.5*  --  8.2*  --   --   PLT 522* 500*  --  432*  --   --   CREATININE 1.51*  --  1.46*  --  1.48* 1.49*   Estimated Creatinine Clearance: 47 mL/min (by C-G formula based on Cr of 1.49). No results for input(s): VANCOTROUGH, VANCOPEAK, VANCORANDOM, GENTTROUGH, GENTPEAK, GENTRANDOM, TOBRATROUGH, TOBRAPEAK, TOBRARND, AMIKACINPEAK, AMIKACINTROU, AMIKACIN in the last 72 hours.   Microbiology: Recent Results (from the past 720 hour(s))  Urine culture     Status: None   Collection Time: 01/04/2014  1:20 PM  Result Value Ref Range Status   Specimen Description URINE, CATHETERIZED  Final   Special Requests NONE  Final   Culture  Setup Time   Final    12/31/2013 04:07 Performed at [place]   Final    80,000 COLONIES/ML Performed at [place] Performed at [facility]   Final   Report Status 01/01/2014 FINAL  Final  Blood culture (routine x 2)     Status: None   Collection Time: 01/07/2014  2:45 PM  Result Value Ref Range Status   Specimen Description BLOOD BLOOD RIGHT FOREARM  Final   Special Requests BOTTLES DRAWN AEROBIC AND ANAEROBIC 5MLS  Final   Culture  Setup Time   Final    12/22/2013 23:45 Performed at [facility]    Culture   Final    NO GROWTH 5 DAYS Performed at [facility]    Report Status 01/05/2014 FINAL  Final  MRSA PCR Screening     Status: None   Collection Time: 12/23/2013  4:41 PM  Result Value Ref Range Status   MRSA by PCR NEGATIVE NEGATIVE Final    Comment:        The GeneXpert MRSA Assay (FDA approved for NASAL specimens only), is one component of a comprehensive MRSA colonization surveillance program. It is not intended to diagnose MRSA infection nor to guide or monitor treatment for MRSA infections.   Blood culture (routine x 2)     Status: None   Collection Time: 12/12/2013  4:43 PM  Result Value Ref Range Status   Specimen Description BLOOD RIGHT HAND  Final   Special Requests BOTTLES DRAWN AEROBIC ONLY 5CC  Final   Culture  Setup Time   Final    01/10/2014 23:45 Performed at [place]   Final    NO GROWTH 5 DAYS Performed at [facility]    Report Status 01/05/2014 FINAL  Final  Culture, respiratory (NON-Expectorated)     Status: None   Collection Time: 01/01/14  1:24 PM  Result Value Ref Range Status   Specimen Description TRACHEAL ASPIRATE  Final   Special Requests NONE  Final   Gram Stain   Final    RARE WBC PRESENT, PREDOMINANTLY MONONUCLEAR NO SQUAMOUS EPITHELIAL CELLS SEEN NO ORGANISMS SEEN Performed at [facility]    Culture   Final    Non-Pathogenic Oropharyngeal-type Flora Isolated. Performed at [facility]    Report Status 01/03/2014 FINAL  Final  Culture, respiratory (NON-Expectorated)     Status: None   Collection Time: 01/08/14 11:22 AM  Result Value Ref Range Status   Specimen Description TRACHEAL ASPIRATE  Final   Special Requests Normal  Final   Gram Stain   Final    FEW WBC PRESENT,BOTH PMN AND MONONUCLEAR RARE SQUAMOUS EPITHELIAL CELLS PRESENT NO ORGANISMS SEEN Performed at [facility]    Culture   Final    Non-Pathogenic Oropharyngeal-type Flora Isolated. Performed at  [facility]    Report Status 01/10/2014 FINAL  Final  Clostridium Difficile by PCR     Status: None   Collection Time: 01/14/14 11:35 PM  Result Value Ref Range Status   C difficile by pcr NEGATIVE NEGATIVE Final    Medical History: Past Medical History  Diagnosis Date  . Hypertension   . Hypercholesterolemia   . GERD (gastroesophageal reflux disease)   . Dyspnea on exertion   . Claudication in peripheral vascular disease   . PVD (peripheral vascular disease)   . S/P renal artery angioplasty 2009--- W/ STENT X1  TO LEFT COMMON ILIAC    . ED (erectile dysfunction)   . History of BPH   . Frequency of urination   . Urgency of urination   . Nocturia   . Chronic gouty arthritis FEET  . Diabetes mellitus     type II-- ORAL MED  . Prostate cancer SCHEDULED FOR RADIOACTIVE SEED IMPLANTS OF PROSTATE  05-18-2011    FOLLOWED BY DR [facility] AND DR Valere Dross  . CML (chronic myelocytic leukemia) FOLLOWED BY DR LEWIS ([place])    TAKES SPRYCEL PO MED FOR TX--  PT STATES STABLE  NO SYMPTOMS  . Iron deficiency anemia   . Chronic renal insufficiency   . PAD (peripheral artery disease)   . Cardiovascular disease with arteriosclerosis   . Blood transfusion LAST TIME NOV 2012  --  X2  UNITS  (FOR ANEMIA AND THROMBOCYTOPENIA  . Leukocytosis   . Peripheral edema   . History of thrombocytopenia   . Heart murmur   . CHF (congestive heart failure) ADMISSION NOV 2012  [facility])  REQUESTED RECORDS    DIATOSLIC CHF WITH LEFT VENTRICULAR IMPAIRED RELAXATION-----   CARDIOLOGIST----  DR Dallie Piles  . Hypertension   . Arthritis   . History of angina   . Acute renal failure Admitted to [facility] 06/06/12 with hip fracture; creatinine 4.3, K 7.8. Aldactone and lisinopril stopped    Medications:  Prescriptions prior to admission  Medication Sig Dispense Refill Last Dose  . acetaminophen (TYLENOL) 325 MG tablet Take 2 tablets (650 mg total) by mouth every 6 (six) hours as  needed.   Past Week at Unknown time  . allopurinol (ZYLOPRIM) 300 MG tablet Take 0.5 tablets (150 mg total) by mouth daily. (Patient taking differently: Take 300 mg by mouth daily. )   12/29/2013 at Unknown time  . amLODipine (NORVASC) 5 MG tablet Take 5 mg by mouth daily.   12/29/2013 at Unknown time  . aspirin 81 MG chewable tablet Chew 81 mg by mouth daily.    12/29/2013 at  Unknown time  . atorvastatin (LIPITOR) 20 MG tablet Take 20 mg by mouth daily.   12/29/2013 at Unknown  . clopidogrel (PLAVIX) 75 MG tablet Take 75 mg by mouth daily.   12/29/2013 at Unknown time  . finasteride (PROSCAR) 5 MG tablet Take 5 mg by mouth daily.   12/29/2013 at Unknown time  . FLORA-Q (FLORA-Q) CAPS capsule Take 1 capsule by mouth 2 (two) times daily.   12/29/2013 at Unknown time  . furosemide (LASIX) 40 MG tablet Take 40 mg by mouth daily.   12/29/2013 at Unknown time  . guaiFENesin (MUCINEX) 600 MG 12 hr tablet Take 1,200 mg by mouth 2 (two) times daily.   12/29/2013 at Unknown time  . hydrALAZINE (APRESOLINE) 10 MG tablet Take 10 mg by mouth 3 (three) times daily.   12/23/2013 at Unknown time  . insulin regular (NOVOLIN R,HUMULIN R) 100 units/mL injection Inject 0-10 Units into the skin 4 (four) times daily -  before meals and at bedtime. 151-200= 2 units 201-250= 4 units 251-300= 6 units 301-350= 8 units 351-400=10 units >400= call MD   12/29/2013 at Unknown time  . ipratropium-albuterol (DUONEB) 0.5-2.5 (3) MG/3ML SOLN Take 3 mLs by nebulization every 6 (six) hours as needed (wheezing).   12/19/2013 at Unknown time  . isosorbide mononitrate (IMDUR) 30 MG 24 hr tablet Take 30 mg by mouth every morning.    12/19/2013 at Unknown time  . metoprolol (LOPRESSOR) 12.5 mg TABS Take 1.5 tablets (37.5 mg total) by mouth 2 (two) times daily. (Patient taking differently: Take 12.5 mg by mouth 2 (two) times daily. )   12/29/2013 at 2100  . nilotinib (TASIGNA) 150 MG capsule Take 300 mg by mouth every 12 (twelve) hours.    12/29/2013 at Unknown time  . oxyCODONE (OXY IR/ROXICODONE) 5 MG immediate release tablet Take 5 mg by mouth 3 (three) times daily.   12/12/2013 at Unknown time  . pantoprazole (PROTONIX) 40 MG tablet Take 40 mg by mouth daily.   12/18/2013 at Unknown time  . pregabalin (LYRICA) 100 MG capsule Take 100 mg by mouth 2 (two) times daily.   12/29/2013 at Unknown time  . ranolazine (RANEXA) 500 MG 12 hr tablet Take 500 mg by mouth 2 (two) times daily.   12/29/2013 at Unknown time  . sennosides-docusate sodium (SENOKOT-S) 8.6-50 MG tablet Take 2 tablets by mouth 2 (two) times daily.   12/29/2013 at Unknown time  . Tamsulosin HCl (FLOMAX) 0.4 MG CAPS Take 0.4 mg by mouth 2 (two) times daily.    12/29/2013 at Unknown time  . famotidine (PEPCID) 40 MG tablet Take 1 tablet (40 mg total) by mouth daily. (Patient not taking: Reported on 12/18/2013)   Not Taking at Unknown time  . feeding supplement (ENSURE) PUDG Take 1 Container by mouth 3 (three) times daily between meals. (Patient not taking: Reported on 12/31/2013)  0 Not Taking at Unknown time  . feeding supplement (GLUCERNA SHAKE) LIQD Take 237 mLs by mouth daily at 12 noon. (Patient not taking: Reported on 12/20/2013)  0 Not Taking at Unknown time  . iron polysaccharides (NIFEREX) 150 MG capsule Take 1 capsule (150 mg total) by mouth daily. (Patient not taking: Reported on 12/29/2013)   Not Taking at Unknown time  . magnesium oxide (MAG-OX) 400 (241.3 MG) MG tablet Take 1 tablet (400 mg total) by mouth daily. (Patient not taking: Reported on 12/11/2013)   Not Taking at Unknown time  . meclizine (ANTIVERT) 25 MG tablet Take 25  mg by mouth 3 (three) times daily as needed for dizziness.   Unk  . sucralfate (CARAFATE) 1 GM/10ML suspension Take 10 mLs (1 g total) by mouth 4 (four) times daily -  with meals and at bedtime. (Patient not taking: Reported on 12/15/2013) 420 mL 0 Not Taking at Unknown time  . traMADol (ULTRAM) 50 MG tablet Take 1 tablet (50 mg  total) by mouth every 6 (six) hours as needed. 60 tablet 0 Unk   Scheduled:  . acetylcysteine  4 mL Nebulization BID  . albuterol  2.5 mg Nebulization Q6H  . antiseptic oral rinse  7 mL Mouth Rinse QID  . aspirin  81 mg Per Tube Daily  . atorvastatin  20 mg Per Tube q1800  . chlorhexidine  15 mL Mouth Rinse BID  . feeding supplement (VITAL HIGH PROTEIN)  1,000 mL Per Tube Q24H  . heparin  5,000 Units Subcutaneous 3 times per day  . insulin aspart  0-15 Units Subcutaneous 6 times per day  . pantoprazole sodium  40 mg Per Tube Q2200  . propofol  5-80 mcg/kg/min Intravenous Once  . sodium chloride  250 mL Intravenous Once   Infusions:  . dexmedetomidine Stopped (01/16/14 0730)  . dextrose 80 mL (01/16/14 0046)  . DOPamine Stopped (01/15/14 1500)   Assessment: 74yo male here with complicated course from SNF. Pharmacy is consulted to dose vancomycin and cefepime for rule out sepsis. Pt is febrile to 102.8, WBC 8.5, sCr 1.49 with CrCl ~ 50 mL/min  Goal of Therapy:  Vancomycin trough level 15-20 mcg/ml  Plan:  Vancomycin 1500mg  IV bolus followed by 750mg  IV q12h Cefepime 1g IV q12h Measure antibiotic drug levels at steady state Follow up culture results, renal function, and clinical course  Andrey Cota. Diona Foley, PharmD Clinical Pharmacist Pager [phone] 01/16/2014,8:46 PM

## 2014-01-16 NOTE — Progress Notes (Signed)
Patient ID: Dale Taylor, male   DOB: 1940-04-13, 74 y.o.   MRN: [id number]   Gastric tube request  Prostate ca Acute/chronic renal failure PNA Trach 1/5 Off Plavix since 01/13/14 Afeb; labs good  Will plan poss G tube 1/8---off Plavix 5 days  On IR Radar

## 2014-01-17 ENCOUNTER — Inpatient Hospital Stay (HOSPITAL_COMMUNITY): Payer: Medicare HMO

## 2014-01-17 LAB — BASIC METABOLIC PANEL
Anion gap: 4 — ABNORMAL LOW (ref 5–15)
Anion gap: 7 (ref 5–15)
BUN: 46 mg/dL — ABNORMAL HIGH (ref 6–23)
BUN: 42 mg/dL — AB (ref 6–23)
CALCIUM: 8 mg/dL — AB (ref 8.4–10.5)
CO2: 27 mmol/L (ref 19–32)
CO2: 24 mmol/L (ref 19–32)
CREATININE: 1.69 mg/dL — AB (ref 0.50–1.35)
Calcium: 8 mg/dL — ABNORMAL LOW (ref 8.4–10.5)
Chloride: 112 mEq/L (ref 96–112)
Chloride: 111 mEq/L (ref 96–112)
Creatinine, Ser: 1.76 mg/dL — ABNORMAL HIGH (ref 0.50–1.35)
GFR, EST AFRICAN AMERICAN: 42 mL/min — AB (ref >90)
GFR, EST AFRICAN AMERICAN: 45 mL/min — AB (ref >90)
GFR, EST NON AFRICAN AMERICAN: 37 mL/min — AB (ref >90)
GFR, EST NON AFRICAN AMERICAN: 38 mL/min — AB (ref >90)
GLUCOSE: 243 mg/dL — AB (ref 70–99)
Glucose, Bld: 220 mg/dL — ABNORMAL HIGH (ref 70–99)
POTASSIUM: 4.9 mmol/L (ref 3.5–5.1)
POTASSIUM: 4.5 mmol/L (ref 3.5–5.1)
Sodium: 143 mmol/L (ref 135–145)
Sodium: 142 mmol/L (ref 135–145)

## 2014-01-17 LAB — GLUCOSE, CAPILLARY
GLUCOSE-CAPILLARY: 167 mg/dL — AB (ref 70–99)
GLUCOSE-CAPILLARY: 178 mg/dL — AB (ref 70–99)
GLUCOSE-CAPILLARY: 196 mg/dL — AB (ref 70–99)
Glucose-Capillary: 206 mg/dL — ABNORMAL HIGH (ref 70–99)
Glucose-Capillary: 192 mg/dL — ABNORMAL HIGH (ref 70–99)

## 2014-01-17 LAB — URINE CULTURE: Colony Count: >=100000

## 2014-01-17 LAB — CBC
HCT: 27.1 % — ABNORMAL LOW (ref 39.0–52.0)
Hemoglobin: 8.2 g/dL — ABNORMAL LOW (ref 13.0–17.0)
MCH: 27.5 pg (ref 26.0–34.0)
MCHC: 30.3 g/dL (ref 30.0–36.0)
MCV: 90.9 fL (ref 78.0–100.0)
Platelets: 425 10*3/uL — ABNORMAL HIGH (ref 150–400)
RBC: 2.98 MIL/uL — AB (ref 4.22–5.81)
RDW: 17.2 % — ABNORMAL HIGH (ref 11.5–15.5)
WBC: 29.1 10*3/uL — AB (ref 4.0–10.5)

## 2014-01-17 LAB — CG4 I-STAT (LACTIC ACID): LACTIC ACID, VENOUS: 0.55 mmol/L (ref 0.5–2.2)

## 2014-01-17 MED ORDER — VANCOMYCIN HCL 10 G IV SOLR
1250.0000 mg | INTRAVENOUS | Status: DC
  Administered 2014-01-18: 1250 mg via INTRAVENOUS
  Filled 2014-01-17: qty 1250

## 2014-01-17 MED ORDER — ACETAMINOPHEN 160 MG/5ML PO SOLN
650.0000 mg | ORAL | Status: DC | PRN
  Administered 2014-01-17 – 2014-01-24 (×11): 650 mg
  Filled 2014-01-17 (×11): qty 20.3

## 2014-01-17 MED ORDER — AMIODARONE HCL 200 MG PO TABS
400.0000 mg | ORAL_TABLET | Freq: Two times a day (BID) | ORAL | Status: AC
  Administered 2014-01-17 – 2014-01-24 (×10): 400 mg via ORAL
  Filled 2014-01-17 (×15): qty 2

## 2014-01-17 MED ORDER — AMIODARONE HCL 200 MG PO TABS
400.0000 mg | ORAL_TABLET | Freq: Every day | ORAL | Status: DC
  Administered 2014-01-25: 400 mg via ORAL
  Filled 2014-01-17: qty 2

## 2014-01-17 MED ORDER — DEXTROSE 5 % IV SOLN
1.0000 g | INTRAVENOUS | Status: DC
  Administered 2014-01-18 – 2014-01-20 (×3): 1 g via INTRAVENOUS
  Filled 2014-01-17 (×3): qty 1

## 2014-01-17 MED ORDER — METOPROLOL TARTRATE 1 MG/ML IV SOLN
5.0000 mg | INTRAVENOUS | Status: AC
  Administered 2014-01-17: 5 mg via INTRAVENOUS

## 2014-01-17 MED ORDER — AMIODARONE IV BOLUS ONLY 150 MG/100ML
150.0000 mg | Freq: Once | INTRAVENOUS | Status: AC
  Administered 2014-01-17: 150 mg via INTRAVENOUS
  Filled 2014-01-17: qty 100

## 2014-01-17 MED ORDER — AMIODARONE LOAD VIA INFUSION
150.0000 mg | Freq: Once | INTRAVENOUS | Status: DC
  Filled 2014-01-17: qty 83.34

## 2014-01-17 MED ORDER — METOPROLOL TARTRATE 12.5 MG HALF TABLET
12.5000 mg | ORAL_TABLET | Freq: Two times a day (BID) | ORAL | Status: DC
  Administered 2014-01-17 – 2014-01-18 (×2): 12.5 mg via ORAL
  Filled 2014-01-17 (×5): qty 1

## 2014-01-17 MED ORDER — METOPROLOL TARTRATE 1 MG/ML IV SOLN
INTRAVENOUS | Status: AC
  Administered 2014-01-17: 5 mg via INTRAVENOUS
  Filled 2014-01-17: qty 5

## 2014-01-17 NOTE — Progress Notes (Signed)
Patient ID: Dale Taylor, male   DOB: 1940-08-05, 74 y.o.   MRN: [id number]   CT Abd reviewed by Dr Laurence Ferrari He approves attempt at placement of percutaneous G tube May need barium enema to see colon if necessary.  Will watch chart for decline in Temp and WBC For appropriate timing

## 2014-01-17 NOTE — Progress Notes (Signed)
Patient ID: Dale Taylor, male   DOB: Dec 07, 1940, 74 y.o.   MRN: [id number]   CT Abd today to evaluate anatomy for possible G tube--per Dr Pascal Lux  Noted wbc 29.1 and Tmax 102.8 Will see results of CT abd And re evaluate labs and temp  On IR radar

## 2014-01-17 NOTE — Progress Notes (Signed)
RT note-changed to full support due to moderate agitation and RR, CT trip as well will continue to assess for wean.

## 2014-01-17 NOTE — Progress Notes (Signed)
PT Cancellation Note  Patient Details Name: Dale Taylor MRN: [id number] DOB: [birth year]   Cancelled Treatment:    Reason Eval/Treat Not Completed: Pain limiting ability to participate (pt denied mobility at this time stating pain and fatigue with continued bloody secretions from trach. will attempt at later date)   Melford Aase 01/17/2014, 8:50 AM Elwyn Reach, [place]

## 2014-01-17 NOTE — Progress Notes (Signed)
RT note-HR 203, placed to full support, RN and MD in room.

## 2014-01-17 NOTE — Progress Notes (Signed)
ANTIBIOTIC CONSULT NOTE - FOLLOW-UP  Pharmacy Consult for Cefepime and Vancomycin Indication: Pneumonia  No Known Allergies  Patient Measurements: Height: 5\' 11"  (180.3 cm) Weight: 189 lb 2.5 oz (85.8 kg) IBW/kg (Calculated) : 75.3  Vital Signs: Temp: 99.3 F (37.4 C) (01/07 0834) Temp Source: Oral (01/07 0834) BP: 146/62 mmHg (01/07 0800) Pulse Rate: 103 (01/07 0800) Intake/Output from previous day: 01/06 0701 - 01/07 0700 In: 4794.5 [I.V.:2164.5; NG/GT:1830; IV Piggyback:800] Out: 1475 [Urine:1475] Intake/Output from this shift: Total I/O In: 310 [I.V.:180; NG/GT:130] Out: 100 [Urine:100]  Labs:  Recent Labs  01/15/14 1500  01/16/14 0315 01/16/14 0500 01/16/14 1700 01/17/14 0500  WBC 9.1  --  8.5  --   --  29.1*  HGB 8.5*  --  8.2*  --   --  8.2*  PLT 500*  --  432*  --   --  425*  CREATININE  --   < >  --  1.48* 1.49* 1.76*  < > = values in this interval not displayed. Estimated Creatinine Clearance: 39.8 mL/min (by C-G formula based on Cr of 1.76). No results for input(s): VANCOTROUGH, VANCOPEAK, VANCORANDOM, GENTTROUGH, GENTPEAK, GENTRANDOM, TOBRATROUGH, TOBRAPEAK, TOBRARND, AMIKACINPEAK, AMIKACINTROU, AMIKACIN in the last 72 hours.   Microbiology: Recent Results (from the past 720 hour(s))  Urine culture     Status: None   Collection Time: 12/14/2013  1:20 PM  Result Value Ref Range Status   Specimen Description URINE, CATHETERIZED  Final   Special Requests NONE  Final   Culture  Setup Time   Final    12/31/2013 04:07 Performed at [place] [facility]   Final    80,000 COLONIES/ML Performed at [place] Performed at [facility]   Final   Report Status 01/01/2014 FINAL  Final  Blood culture (routine x 2)     Status: None   Collection Time: 01/09/2014  2:45 PM  Result Value Ref Range Status   Specimen Description BLOOD BLOOD RIGHT FOREARM  Final   Special Requests BOTTLES DRAWN AEROBIC AND  ANAEROBIC 5MLS  Final   Culture  Setup Time   Final    12/23/2013 23:45 Performed at [facility]    Culture   Final    NO GROWTH 5 DAYS Performed at [facility]    Report Status 01/05/2014 FINAL  Final  MRSA PCR Screening     Status: None   Collection Time: 01/09/2014  4:41 PM  Result Value Ref Range Status   MRSA by PCR NEGATIVE NEGATIVE Final    Comment:        The GeneXpert MRSA Assay (FDA approved for NASAL specimens only), is one component of a comprehensive MRSA colonization surveillance program. It is not intended to diagnose MRSA infection nor to guide or monitor treatment for MRSA infections.   Blood culture (routine x 2)     Status: None   Collection Time: 12/23/2013  4:43 PM  Result Value Ref Range Status   Specimen Description BLOOD RIGHT HAND  Final   Special Requests BOTTLES DRAWN AEROBIC ONLY 5CC  Final   Culture  Setup Time   Final    01/04/2014 23:45 Performed at [facility]   Final    NO GROWTH 5 DAYS Performed at [facility]    Report Status 01/05/2014 FINAL  Final  Culture, respiratory (NON-Expectorated)     Status: None   Collection Time:  01/01/14  1:24 PM  Result Value Ref Range Status   Specimen Description TRACHEAL ASPIRATE  Final   Special Requests NONE  Final   Gram Stain   Final    RARE WBC PRESENT, PREDOMINANTLY MONONUCLEAR NO SQUAMOUS EPITHELIAL CELLS SEEN NO ORGANISMS SEEN Performed at [facility]    Culture   Final    Non-Pathogenic Oropharyngeal-type Flora Isolated. Performed at [facility]    Report Status 01/03/2014 FINAL  Final  Culture, respiratory (NON-Expectorated)     Status: None   Collection Time: 01/08/14 11:22 AM  Result Value Ref Range Status   Specimen Description TRACHEAL ASPIRATE  Final   Special Requests Normal  Final   Gram Stain   Final    FEW WBC PRESENT,BOTH PMN AND MONONUCLEAR RARE SQUAMOUS EPITHELIAL CELLS PRESENT NO ORGANISMS  SEEN Performed at [facility]    Culture   Final    Non-Pathogenic Oropharyngeal-type Flora Isolated. Performed at [facility]    Report Status 01/10/2014 FINAL  Final  Clostridium Difficile by PCR     Status: None   Collection Time: 01/14/14 11:35 PM  Result Value Ref Range Status   C difficile by pcr NEGATIVE NEGATIVE Final    Medical History: Past Medical History  Diagnosis Date  . Hypertension   . Hypercholesterolemia   . GERD (gastroesophageal reflux disease)   . Dyspnea on exertion   . Claudication in peripheral vascular disease   . PVD (peripheral vascular disease)   . S/P renal artery angioplasty 2009--- W/ STENT X1  TO LEFT COMMON ILIAC    . ED (erectile dysfunction)   . History of BPH   . Frequency of urination   . Urgency of urination   . Nocturia   . Chronic gouty arthritis FEET  . Diabetes mellitus     type II-- ORAL MED  . Prostate cancer SCHEDULED FOR RADIOACTIVE SEED IMPLANTS OF PROSTATE  05-18-2011    FOLLOWED BY DR [facility] AND DR Valere Dross  . CML (chronic myelocytic leukemia) FOLLOWED BY DR LEWIS ([place])    TAKES SPRYCEL PO MED FOR TX--  PT STATES STABLE  NO SYMPTOMS  . Iron deficiency anemia   . Chronic renal insufficiency   . PAD (peripheral artery disease)   . Cardiovascular disease with arteriosclerosis   . Blood transfusion LAST TIME NOV 2012  --  X2  UNITS  (FOR ANEMIA AND THROMBOCYTOPENIA  . Leukocytosis   . Peripheral edema   . History of thrombocytopenia   . Heart murmur   . CHF (congestive heart failure) ADMISSION NOV 2012  [facility])  REQUESTED RECORDS    DIATOSLIC CHF WITH LEFT VENTRICULAR IMPAIRED RELAXATION-----   CARDIOLOGIST----  DR Dallie Piles  . Hypertension   . Arthritis   . History of angina   . Acute renal failure Admitted to [facility] 06/06/12 with hip fracture; creatinine 4.3, K 7.8. Aldactone and lisinopril stopped    Medications:  Prescriptions prior to admission  Medication Sig  Dispense Refill Last Dose  . acetaminophen (TYLENOL) 325 MG tablet Take 2 tablets (650 mg total) by mouth every 6 (six) hours as needed.   Past Week at Unknown time  . allopurinol (ZYLOPRIM) 300 MG tablet Take 0.5 tablets (150 mg total) by mouth daily. (Patient taking differently: Take 300 mg by mouth daily. )   12/29/2013 at Unknown time  . amLODipine (NORVASC) 5 MG tablet Take 5 mg by mouth daily.   12/29/2013 at Unknown time  .  aspirin 81 MG chewable tablet Chew 81 mg by mouth daily.    12/29/2013 at Unknown time  . atorvastatin (LIPITOR) 20 MG tablet Take 20 mg by mouth daily.   12/29/2013 at Unknown  . clopidogrel (PLAVIX) 75 MG tablet Take 75 mg by mouth daily.   12/29/2013 at Unknown time  . finasteride (PROSCAR) 5 MG tablet Take 5 mg by mouth daily.   12/29/2013 at Unknown time  . FLORA-Q (FLORA-Q) CAPS capsule Take 1 capsule by mouth 2 (two) times daily.   12/29/2013 at Unknown time  . furosemide (LASIX) 40 MG tablet Take 40 mg by mouth daily.   12/29/2013 at Unknown time  . guaiFENesin (MUCINEX) 600 MG 12 hr tablet Take 1,200 mg by mouth 2 (two) times daily.   12/29/2013 at Unknown time  . hydrALAZINE (APRESOLINE) 10 MG tablet Take 10 mg by mouth 3 (three) times daily.   12/19/2013 at Unknown time  . insulin regular (NOVOLIN R,HUMULIN R) 100 units/mL injection Inject 0-10 Units into the skin 4 (four) times daily -  before meals and at bedtime. 151-200= 2 units 201-250= 4 units 251-300= 6 units 301-350= 8 units 351-400=10 units >400= call MD   12/29/2013 at Unknown time  . ipratropium-albuterol (DUONEB) 0.5-2.5 (3) MG/3ML SOLN Take 3 mLs by nebulization every 6 (six) hours as needed (wheezing).   12/19/2013 at Unknown time  . isosorbide mononitrate (IMDUR) 30 MG 24 hr tablet Take 30 mg by mouth every morning.    01/09/2014 at Unknown time  . metoprolol (LOPRESSOR) 12.5 mg TABS Take 1.5 tablets (37.5 mg total) by mouth 2 (two) times daily. (Patient taking differently: Take 12.5 mg by  mouth 2 (two) times daily. )   12/29/2013 at 2100  . nilotinib (TASIGNA) 150 MG capsule Take 300 mg by mouth every 12 (twelve) hours.   12/29/2013 at Unknown time  . oxyCODONE (OXY IR/ROXICODONE) 5 MG immediate release tablet Take 5 mg by mouth 3 (three) times daily.   12/21/2013 at Unknown time  . pantoprazole (PROTONIX) 40 MG tablet Take 40 mg by mouth daily.   01/08/2014 at Unknown time  . pregabalin (LYRICA) 100 MG capsule Take 100 mg by mouth 2 (two) times daily.   12/29/2013 at Unknown time  . ranolazine (RANEXA) 500 MG 12 hr tablet Take 500 mg by mouth 2 (two) times daily.   12/29/2013 at Unknown time  . sennosides-docusate sodium (SENOKOT-S) 8.6-50 MG tablet Take 2 tablets by mouth 2 (two) times daily.   12/29/2013 at Unknown time  . Tamsulosin HCl (FLOMAX) 0.4 MG CAPS Take 0.4 mg by mouth 2 (two) times daily.    12/29/2013 at Unknown time  . famotidine (PEPCID) 40 MG tablet Take 1 tablet (40 mg total) by mouth daily. (Patient not taking: Reported on 01/02/2014)   Not Taking at Unknown time  . feeding supplement (ENSURE) PUDG Take 1 Container by mouth 3 (three) times daily between meals. (Patient not taking: Reported on 12/17/2013)  0 Not Taking at Unknown time  . feeding supplement (GLUCERNA SHAKE) LIQD Take 237 mLs by mouth daily at 12 noon. (Patient not taking: Reported on 12/27/2013)  0 Not Taking at Unknown time  . iron polysaccharides (NIFEREX) 150 MG capsule Take 1 capsule (150 mg total) by mouth daily. (Patient not taking: Reported on 12/12/2013)   Not Taking at Unknown time  . magnesium oxide (MAG-OX) 400 (241.3 MG) MG tablet Take 1 tablet (400 mg total) by mouth daily. (Patient not taking: Reported on 01/08/2014)  Not Taking at Unknown time  . meclizine (ANTIVERT) 25 MG tablet Take 25 mg by mouth 3 (three) times daily as needed for dizziness.   Unk  . sucralfate (CARAFATE) 1 GM/10ML suspension Take 10 mLs (1 g total) by mouth 4 (four) times daily -  with meals and at bedtime.  (Patient not taking: Reported on 01/10/2014) 420 mL 0 Not Taking at Unknown time  . traMADol (ULTRAM) 50 MG tablet Take 1 tablet (50 mg total) by mouth every 6 (six) hours as needed. 60 tablet 0 Unk   Scheduled:  . acetylcysteine  4 mL Nebulization BID  . albuterol  2.5 mg Nebulization Q6H  . antiseptic oral rinse  7 mL Mouth Rinse QID  . aspirin  81 mg Per Tube Daily  . atorvastatin  20 mg Per Tube q1800  . [START ON 01/18/2014] ceFEPime (MAXIPIME) IV  1 g Intravenous Q24H  . chlorhexidine  15 mL Mouth Rinse BID  . feeding supplement (VITAL HIGH PROTEIN)  1,000 mL Per Tube Q24H  . heparin  5,000 Units Subcutaneous 3 times per day  . insulin aspart  0-15 Units Subcutaneous 6 times per day  . metoprolol  12.5 mg Oral BID  . pantoprazole sodium  40 mg Per Tube Q2200  . propofol  5-80 mcg/kg/min Intravenous Once  . [START ON 01/18/2014] vancomycin  1,250 mg Intravenous Q24H   Infusions:  . dexmedetomidine Stopped (01/16/14 0730)  . DOPamine Stopped (01/15/14 1500)   Assessment: 49 YOM here with complicated course from SNF. Pharmacy is consulted to restart and dose vancomycin and cefepime for pneumonia. Tmax 102.8, currently afebrile. WBC elevated to 29.1, SCr 1.76 with CrCl ~40 mL/min (decreased from ~54mL/min).   Vanc 12/20 >> 12/21 restarted 1/6 Cefepime 12/20 >>12/28 restarted 1/6  12/20 UCx - 80k yeast 12/20 BCx - Neg 12/22 TA cx - neg 12/29 TA - nml flora 1/6 BCx -  1/6 UCx -  Goal of Therapy:  Vancomycin trough level 15-20 mcg/ml  Plan:  -Change Vancomycin to 1250mg  q24h -Change Cefepime to 1g IV q24h -Vancomycin level prn -Monitor renal function, WBC, temp, Cx and clinical course  Gloriajean Dell, PharmD Candidate   I agree with above assessment and plan.  Sherlon Handing, PharmD, BCPS Clinical pharmacist, pager [phone] 01/17/2014 1:28 PM

## 2014-01-17 NOTE — Progress Notes (Signed)
PULMONARY / CRITICAL CARE MEDICINE   Name: Dale Taylor MRN: [id number] DOB: [birth year]    ADMISSION DATE:  12/20/2013 CONSULTATION DATE:  12/20  REFERRING MD :  FPTS  CHIEF COMPLAINT:  Respiratory failure    INITIAL PRESENTATION:  74 yo male CRI, prostate ca admitted with A on C renal failure, hyperkalemia, HCAP, UTI.  PCCM assumed care in ICU.  STUDIES/events:  12/21 TTE >> EF 40 to 10%, grade 1 diastolic dysfx 93/23 Renal US >> negative 12/21 PAF with RVR. Amiodarone started. Converted to NSR. Cards consult obtained. Recommended continuing current rx. No anticoagulation given high risk 12/22 Increased dyspnea. White out of R hemithorax on CXR. Intubated. Copious purulent secretions from ETT.  12/22 CT chest >> extensive pleural thickening on R. R lung with reduced volume. Bilateral R>L AS dz 01/15/14: CXR worsening collapse of RUL and RLL. 01/16/14: Increasing left lung interstitial opacities suspicious for edema, improved aeration RLL. No pnx. 1/6 bronch rt main collapse   Events: Mucus plugging 12/25. 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 Progressive post extubation rigt lung collapse 01/08/14 and reintubated. NEEDS TRACH - family and patient on bord. Plavix on hold since 01/06/14    01/12/14: Doing PSV. On Chest PT. Family meeting - desire to go ahead with Lurline [place], [place] 01/14/14: hyper K+, brady, d/ced lopressor. Treated hyperK+. 01/15/14: placed trach. Treating hypernatremia. hgb overall stable but trending.  01/16/14: had some bleeding trach site, pain at trach site.   SUBJECTIVE/OVERNIGHT/INTERVAL HX Had fever 102.8, was hypotensive to 89/63 and tachy ~110. Recultured, started vanc and cefepime. bolused 250, kayex for K 5.6.  VITAL SIGNS: Temp:  [98.5 F (36.9 C)-102.8 F (39.3 C)] 99.5 F (37.5 C) (01/07 0442) Pulse Rate:  [64-134] 101 (01/07 0630) Resp:  [0-30] 24 (01/07 0630) BP: (89-164)/(33-86) 154/55 mmHg (01/07 0630) SpO2:  [95 %-100 %] 100 %  (01/07 0630) FiO2 (%):  [40 %] 40 % (01/07 0338) Weight:  [85.8 kg (189 lb 2.5 oz)] 85.8 kg (189 lb 2.5 oz) (01/07 0456) INTAKE / OUTPUT:  Intake/Output Summary (Last 24 hours) at 01/17/14 0656 Last data filed at 01/17/14 0600  Gross per 24 hour  Intake 4803.5 ml  Output   1475 ml  Net 3328.5 ml    PHYSICAL EXAMINATION: General: in bed. Sleeping but  Awakes and following commands. Neuro :RASS 0. Follows commands.  HEENT: no sinus tenderness, eomi, perrla. Trach site some dry blood. Points to it when asked about pain. Cardiovascular: regular rate and rhythm. Lungs:coarse bilat , improved aeration rt Abdomen:  Obese, soft, +bs, suprapubic catheter intact no tenderness. Ext:  No edema  LABS: PULMONARY  Recent Labs Lab 01/14/14 1118  PHART 7.377  PCO2ART 55.8*  PO2ART 88.0  HCO3 32.8*  TCO2 34  O2SAT 96.0    CBC  Recent Labs Lab 01/15/14 1500 01/16/14 0315 01/17/14 0500  HGB 8.5* 8.2* 8.2*  HCT 27.4* 26.3* 27.1*  WBC 9.1 8.5 29.1*  PLT 500* 432* 425*    COAGULATION  Recent Labs Lab 01/14/14 1600  INR 1.18    CARDIAC  No results for input(s): TROPONINI in the last 168 hours. No results for input(s): PROBNP in the last 168 hours.   CHEMISTRY  Recent Labs Lab 01/11/14 0400 01/12/14 0419 01/13/14 0430 01/14/14 0500  01/15/14 0445 01/15/14 1600 01/16/14 0500 01/16/14 1700 01/17/14 0500  NA 146*  --   --  147*  < > 149* 151* 144 146* 143  K 4.7  --   --  6.3*  < >  4.9 4.5 5.3* 5.6* 4.9  CL 110  --   --  107  < > 111 115* 111 113* 112  CO2 29  --   --  34*  < > 31 28 27 27 27   GLUCOSE 196*  --   --  176*  < > 190* 157* 198* 156* 243*  BUN 46*  --   --  53*  < > 49* 42* 43* 39* 46*  CREATININE 1.41*  --   --  1.30  < > 1.51* 1.46* 1.48* 1.49* 1.76*  CALCIUM 8.5  --   --  9.0  < > 8.4 8.5 8.0* 8.4 8.0*  MG 1.6 1.7 1.8 2.0  --  2.0  --   --   --   --   PHOS 2.2* 2.4 3.2 3.6  --  4.0  --   --   --   --   < > = values in this interval not  displayed. Estimated Creatinine Clearance: 39.8 mL/min (by C-G formula based on Cr of 1.76).   LIVER  Recent Labs Lab 01/14/14 1600 01/16/14 0500  AST  --  32  ALT  --  37  ALKPHOS  --  81  BILITOT  --  0.6  PROT  --  4.8*  ALBUMIN  --  2.4*  INR 1.18  --      INFECTIOUS No results for input(s): LATICACIDVEN, PROCALCITON in the last 168 hours.   ENDOCRINE CBG (last 3)   Recent Labs  01/16/14 1928 01/17/14 0030 01/17/14 0440  GLUCAP 137* 196* 206*    IMAGING x48h Dg Chest Port 1 View  01/16/2014   CLINICAL DATA:  Acute respiratory distress. Status post bronchoscopy.  EXAM: PORTABLE CHEST - 1 VIEW  COMPARISON:  01/16/2014 and prior radiographs.  FINDINGS: A tracheostomy tube, left IJ central venous catheter with tip overlying the upper SVC and small bore feeding tube with tip overlying the proximal stomach noted.  Improved aeration of the right lower lung identified.  There is no evidence of pneumothorax.  Increasing interstitial opacities within the left lung likely represents mild edema.  IMPRESSION: Increasing left lung interstitial opacities suspicious for edema.  Improved aeration of the right lower lung.  No evidence of pneumothorax.   Electronically Signed   By: Hassan Rowan M.D.   On: 01/16/2014 17:58   Dg Chest Port 1 View  01/16/2014   CLINICAL DATA:  Tracheostomy evaluation  EXAM: PORTABLE CHEST - 1 VIEW  COMPARISON:  156 cm  FINDINGS: Tracheostomy tube projects over the tracheal air column. Feeding tube crosses the gastroesophageal junction. No change in position of left internal jugular central line with tip over the superior vena cava.  Much of the cardiac silhouette is obscured by extensive right thoracic density. There is extensive opacification over the right lung with some aerated right upper lobe of lung. There is also right upper lobe postoperative change. Mildly increased opacity when compared to prior study. Left lung is clear except for minimal discoid  atelectasis at the base.  IMPRESSION: Worsening opacities right upper lobe and lower lobe consistent with further worsening collapse.   Electronically Signed   By: Skipper Cliche M.D.   On: 01/16/2014 13:09   Dg Chest Port 1 View  01/15/2014   CLINICAL DATA:  respiratory failure.  Tracheostomy placement  EXAM: PORTABLE CHEST - 1 VIEW  COMPARISON:  01/14/2014  FINDINGS: Interval placement of tracheostomy in good position.  Left jugular catheter tip in the  SVC unchanged  Progression of right lower lobe collapse and effusion. There is also some right upper lobe collapse. Surgical clips in the right upper lobe. Left lung remains clear. No pneumothorax  Satisfactory  IMPRESSION: Satisfactory tracheostomy placement.  Worsening collapse in the right upper lobe and right lower lobe.   Electronically Signed   By: Franchot Gallo M.D.   On: 01/15/2014 14:38   Dg Abd Portable 1v  01/15/2014   CLINICAL DATA:  Check feeding tube placement  EXAM: PORTABLE ABDOMEN - 1 VIEW  COMPARISON:  01/08/2014  FINDINGS: The feeding tube is now seen extending into the stomach. Nonobstructive bowel gas pattern is noted. Patchy changes are again seen in the right lung base stable from the prior exam. There has been some improvement in the left lung when compared with the prior study.  IMPRESSION: Feeding catheter within the stomach.   Electronically Signed   By: Inez Catalina M.D.   On: 01/15/2014 17:52    ASSESSMENT / PLAN:  PULMONARY ETT 12/22 >> 12/24, 01/08/14  A: Acute on chronic hypercarbic respiratory failure 2nd to PNA.  Intubated 12/22 - 12/24 and reintubated 01/08/14 Chronic R pleural thickening of unclear etiology. R lung collapse. S/p bronch 12/27 - diffuse tracheobronchomalacia 01/15/14: trached.  1/6: some edema on left lung on CXR. bronched yesterday for right lung collapse.  P:   WUA as tolerated On trach, Not requiring PS currently, try weaning to CPAP PS and avoid TC with collapse prior .  Oxygen to keep  SpO2 > 92% May need some lasix as cxr shows some volume overload, hold lasix today Cont chest pt and mucomyst., allow to dc in am  F/up cytology from bronch.  CARDIOVASCULAR Lt IJ CVL 12/22 >> A: Hx of CAD, HTN, HLD, chronic combined CHF, PVD. PAF > in sinus rhythm >> Not considered candidate for chronic anticoagulation.  BP was low around 80's got bolused 250cc. Now in 140-150's. Barely tachycardic currently, in sinus.  P:  Hold outpt norvasc, hydralazine, ranexa for now. Also d/ced metoprolol 1/4. Dced imdur 1/6.  Cont holding home amio. Cont asa, lipitor plavix hold as some some oozing noted still May need restart amio once HR increases further  RENAL A: Hypernatremia - improving. Hyperkalemia - requiring intermittent kayex with improvement.  Acute kidney injury >> some crt rise today. Could be cardiorenal or from transient hypotension last night.  Hx of BPH, prostate cancer, CKD.  P:   Hold Proscar, flomax, tasigna for now Since hypernatremia improved, may consider stopping D5w and try diuresing him with lasix in am  bmet in am   GASTROINTESTINAL A: Nutrition. Hx of GERD. P:   Protonix for SUP Cont TF IR consulted for PEG, likely tomorrow.   HEMATOLOGIC A: Acute on chronic Anemia of chronic disease, critical illness.   hgb now stable around 8.2 Hx of CML. Thrombocytopenia 2nd to sepsis >> resolved.  P:  Monitor CBC q12hr >> daily.  PRBC for hgb < 7gm% DVT px: SQ heparin coags wnl for trach Some oozing trach, holding plavix further  INFECTIOUS  Tracheal aspirate 01/08/14  A: Pneumonia. - HCAP - no microbial etiology identified, s/p 8 day cefepime ending 01/07/14 01/12/13 -  -cultures negative. Cdiff negative. Septic 1/6. Started vanc+cefepime. UA showed large leuk, ucx pending. bcx pending. P:   likley this is vap, maintain current coverage Follow BAL cxr in am  F/up cultures. Keep on vanc+cefepime today and monitor.   ENDOCRINE A: DM 2 with  renal complications.  Hx of gout. Hypoglycemia - resolved with d5w. P:   SSI Hold allopurinol for now If flare gout - then add steroids LFTs normal.  NEUROLOGIC A: Acute metabolic encephalopathy - resolved Neuropathy.  P:   Monitor mental status - watch out for sundowning and use nonpharmacologic techniques to reduce delirium. Hold lyrica for now Off precedex.   Dellia Nims, M.D PGY-1 Internal Medicine Pager [phone]  01/17/2014 6:56 AM  STAFF NOTE: Linwood Dibbles, MD FACP have personally reviewed patient's available data, including medical history, events of note, physical examination and test results as part of my evaluation. I have discussed with resident/NP and other care providers such as pharmacist, RN and RRT. In addition, I personally evaluated patient and elicited key findings of: Improved coarseness and aeration rt, pcxr improved, dc d5w, bme tin am , peg needed, holding plavix still, wbc noted, treating vap? The patient is critically ill with multiple organ systems failure and requires high complexity decision making for assessment and support, frequent evaluation and titration of therapies, application of advanced monitoring technologies and extensive interpretation of multiple databases.   Critical Care Time devoted to patient care services described in this note is30 Minutes. This time reflects time of care of this signee: Merrie Roof, MD FACP. This critical care time does not reflect procedure time, or teaching time or supervisory time of PA/NP/Med student/Med Resident etc but could involve care discussion time. Rest per NP/medical resident whose note is outlined above and that I agree with   Lavon Paganini. Titus Mould, MD, [place] Pgr: [facility] 01/17/2014 11:19 AM

## 2014-01-17 NOTE — Progress Notes (Signed)
Pt seen at this time for trach consult.  Pt currently weaning on PS trial 12/5 tolerating well.  No education needed.  Will continue to follow pt for progress.

## 2014-01-17 NOTE — Clinical Social Work Note (Signed)
Handoff provided to [facility] unit assigned Clinical Education officer, museum. This CSW signing off.   Glendon Axe, MSW, LCSWA [phone] 01/17/2014 4:11 PM

## 2014-01-17 NOTE — Trach Care Team (Signed)
[place] Progression Note   Patient Details Name: Dale Taylor MRN: [id number] DOB: 07/04/1940 Today's Date: 01/17/2014   Tracheostomy Assessment    Tracheostomy Shiley 6 mm Cuffed (Active)  Status Secured 01/17/2014 11:18 AM  Site Assessment Bleeding 01/17/2014 11:18 AM  Site Care Cleansed 01/17/2014  8:00 AM  Inner Cannula Care Cleansed/dried 01/17/2014  7:45 AM  Ties Assessment Secure 01/17/2014 11:18 AM  Cuff pressure (cm) 80 cm 01/17/2014  7:45 AM  Emergency Equipment at bedside Yes 01/17/2014 11:18 AM     Care Needs Suture removal post-op day #7 : 01/22/14   Respiratory Therapy O2 Device: Ventilator FiO2 (%): 40 % SpO2: 99 % Education:  (none needed at this time) Follow up recommendations:  (will continue to follow for progress) Respiratory barriers to progression:  (Pt remains on vent at this time)    Speech Language Pathology  SLP chart review complete: Patient not ready for SLP services (? in-line in future, trach site bleed/pain, tracheomalacia)   Physical Therapy Ambulation/Gait assistance:  (pt unable) PT Recommendation/Assessment: Patient needs continued PT services Follow Up Recommendations: LTACH PT equipment: None recommended by PT    Occupational Therapy      Nutritional Patient's Current Diet: Tube feeding Tube Feeding: Vital High Protein Tube Feeding Frequency: Continuous Tube Feeding Strength: Full strength    Case Management/Social Work Level of patient care prior to hospitalization: SNF/Assisted  living facility Insurance payer: Medicare Barriers to progression: clinical/bradycardic Anticipated discharge disposition: SNF/Assisted  living facility    Provider [place] Team/Provider Recommendations [place] Team Members Present-  Ciro Backer, RT, Alvino Blood, SW, Molli Barrows, RD, Herbie [place], SLP, Deveron Furlong, [place], NP New trach - continue to follow.           Renn Dirocco, Jaci Carrel ((scribe for team) 01/17/2014, 2:33  PM

## 2014-01-18 ENCOUNTER — Inpatient Hospital Stay (HOSPITAL_COMMUNITY): Payer: Medicare HMO

## 2014-01-18 DIAGNOSIS — J9621 Acute and chronic respiratory failure with hypoxia: Secondary | ICD-10-CM

## 2014-01-18 LAB — BASIC METABOLIC PANEL
Anion gap: 8 (ref 5–15)
BUN: 43 mg/dL — ABNORMAL HIGH (ref 6–23)
CO2: 26 mmol/L (ref 19–32)
Calcium: 8 mg/dL — ABNORMAL LOW (ref 8.4–10.5)
Chloride: 106 mEq/L (ref 96–112)
Creatinine, Ser: 1.58 mg/dL — ABNORMAL HIGH (ref 0.50–1.35)
GFR calc Af Amer: 48 mL/min — ABNORMAL LOW (ref >90)
GFR calc non Af Amer: 42 mL/min — ABNORMAL LOW (ref >90)
Glucose, Bld: 179 mg/dL — ABNORMAL HIGH (ref 70–99)
Potassium: 3.8 mmol/L (ref 3.5–5.1)
Sodium: 140 mmol/L (ref 135–145)

## 2014-01-18 LAB — CBC
HCT: 25.2 % — ABNORMAL LOW (ref 39.0–52.0)
Hemoglobin: 7.9 g/dL — ABNORMAL LOW (ref 13.0–17.0)
MCH: 28 pg (ref 26.0–34.0)
MCHC: 31.3 g/dL (ref 30.0–36.0)
MCV: 89.4 fL (ref 78.0–100.0)
Platelets: 359 10*3/uL (ref 150–400)
RBC: 2.82 MIL/uL — ABNORMAL LOW (ref 4.22–5.81)
RDW: 16.9 % — ABNORMAL HIGH (ref 11.5–15.5)
WBC: 21.1 10*3/uL — ABNORMAL HIGH (ref 4.0–10.5)

## 2014-01-18 LAB — GLUCOSE, CAPILLARY
GLUCOSE-CAPILLARY: 138 mg/dL — AB (ref 70–99)
GLUCOSE-CAPILLARY: 176 mg/dL — AB (ref 70–99)
Glucose-Capillary: 142 mg/dL — ABNORMAL HIGH (ref 70–99)
Glucose-Capillary: 154 mg/dL — ABNORMAL HIGH (ref 70–99)
Glucose-Capillary: 164 mg/dL — ABNORMAL HIGH (ref 70–99)
Glucose-Capillary: 176 mg/dL — ABNORMAL HIGH (ref 70–99)
Glucose-Capillary: 200 mg/dL — ABNORMAL HIGH (ref 70–99)
Glucose-Capillary: 207 mg/dL — ABNORMAL HIGH (ref 70–99)

## 2014-01-18 MED ORDER — FREE WATER
200.0000 mL | Freq: Three times a day (TID) | Status: DC
  Administered 2014-01-18 – 2014-01-20 (×6): 200 mL

## 2014-01-18 MED ORDER — VANCOMYCIN HCL IN DEXTROSE 750-5 MG/150ML-% IV SOLN
750.0000 mg | Freq: Two times a day (BID) | INTRAVENOUS | Status: DC
  Administered 2014-01-18 – 2014-01-20 (×4): 750 mg via INTRAVENOUS
  Filled 2014-01-18 (×6): qty 150

## 2014-01-18 MED ORDER — METOPROLOL TARTRATE 25 MG PO TABS
25.0000 mg | ORAL_TABLET | Freq: Two times a day (BID) | ORAL | Status: DC
  Administered 2014-01-18 – 2014-02-04 (×29): 25 mg
  Filled 2014-01-18 (×35): qty 1

## 2014-01-18 MED ORDER — SODIUM CHLORIDE 0.9 % IV SOLN
600.0000 mg | Freq: Once | INTRAVENOUS | Status: AC
  Administered 2014-01-18: 600 mg via INTRAVENOUS
  Filled 2014-01-18: qty 6

## 2014-01-18 MED ORDER — FAMOTIDINE 40 MG/5ML PO SUSR
20.0000 mg | Freq: Two times a day (BID) | ORAL | Status: DC
  Administered 2014-01-18 – 2014-02-04 (×31): 20 mg
  Filled 2014-01-18 (×36): qty 2.5

## 2014-01-18 NOTE — Progress Notes (Addendum)
Patient ID: Dale Taylor, male   DOB: 1940-04-21, 74 y.o.   MRN: [id number]   Scheduled for percutaneous G tube when appropriate  Tmax 101.5  Wbc 21.1  If afebrile over weekend and wbc trending down--- Maybe 01/21/14

## 2014-01-18 NOTE — Progress Notes (Signed)
Notified [facility] MD of pt possible right side facial droop and headache. Motor response and strength is equal bilaterally.  MD camera into the room and doesn't feel the pt has facial drooping. I gave the pt tylenol for the headache. I will continue to monitor closely.   Alessandra Grout, RN

## 2014-01-18 NOTE — Progress Notes (Signed)
Switched patient to [facility] - TV-600, rr-24, FIO2-40%, PEEP-5

## 2014-01-18 NOTE — Clinical Social Work Note (Signed)
CSW spoke with patients wife concerning Vent SNF.  Patients wife is agreeable to CSW doing a bed search and would be agreeable to SNF placement if LTACH is not an option.  CSW faxed to the following facilities:  [facility], [place], [place]- [address] [place], [place], [place]  [facility], [place], [place]- [phone]  No bed offers at this time.  Domenica Reamer, [place] Social Worker [phone]

## 2014-01-18 NOTE — Progress Notes (Signed)
01/18/2013 patient continue to have H/A this morning, Dr Alva Garnet was made aware. [facility] RN.

## 2014-01-18 NOTE — Progress Notes (Addendum)
Inpatient Diabetes Program Recommendations  AACE/ADA: New Consensus Statement on Inpatient Glycemic Control (2013)  Target Ranges:  Prepandial:   less than 140 mg/dL      Peak postprandial:   less than 180 mg/dL (1-2 hours)      Critically ill patients:  140 - 180 mg/dL     Results for RADAMES, MEJORADO (MRN [id number]) as of 01/18/2014 13:40  Ref. Range 01/17/2014 00:30 01/17/2014 04:40 01/17/2014 08:33 01/17/2014 11:38 01/17/2014 16:56 01/17/2014 19:53  Glucose-Capillary Latest Range: 70-99 mg/dL 196 (H) 206 (H) 178 (H) 167 (H) 192 (H) 200 (H)    Results for RHIAN, FUNARI (MRN [id number]) as of 01/18/2014 13:40  Ref. Range 01/18/2014 00:11 01/18/2014 04:05 01/18/2014 08:18 01/18/2014 12:34  Glucose-Capillary Latest Range: 70-99 mg/dL 176 (H) 207 (H) 176 (H) 164 (H)     Current Orders: Novolog Moderate SSI Q4 hours   **Patient currently receiving Vital High ProteinTube feeds at 65 cc/hour  **Having occasional glucose elevations.    MD- Please consider adding Novolog tube feed coverage-  Novolog 3 units Q4 hours (hold if tube feeds held for any reason)    Will follow Wyn Quaker RN, MSN, CDE Diabetes Coordinator Inpatient Diabetes Program Team Pager: [phone] (8a-10p)

## 2014-01-18 NOTE — Progress Notes (Signed)
PULMONARY / CRITICAL CARE MEDICINE   Name: Dale Taylor MRN: [id number] DOB: 08-05-1940    ADMISSION DATE:  01/09/2014 CONSULTATION DATE:  12/20  REFERRING MD :  FPTS  CHIEF COMPLAINT:  Respiratory failure    INITIAL PRESENTATION:  74 yo male CRI, prostate ca admitted with A on C renal failure, hyperkalemia, HCAP, UTI.    STUDIES/EVENTS:  12/20 Admitted to [facility] wit dx of PNA 12/20 Transferred to ICU with respiratory acidosis, hypersomnolence. PCCM consultation and assumption of care. Treated initially with NPPV 12/21 TTE:  EF 40 to 44%, grade 1 diastolic dysfx 96/75 Renal US:  No hydronephrosis 12/21 PAF with RVR. Amiodarone started. Converted to NSR. Cards consult obtained. Recommended continuing current rx. No anticoagulation given high risk 12/22 Increased dyspnea. White out of R hemithorax on CXR. Intubated. Copious purulent secretions from ETT.  12/22 CT chest:  extensive pleural thickening on R. R lung with reduced volume. 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 Bronchomalacia noted 12/29 Re-intubation  01/02 Family conference. Discussed options and pt wished to proceed with trach tube placement and likely LTACH 01/04 Hyper K+, brady, d/ced lopressor. Treated hyperK+. 01/05 Recurrent R lung collapse by CXR. Tracheostomy tube placed. 01/06 Increasing left lung interstitial opacities suspicious for edema, improved aeration RLL. No pnx. 01/06 Recurrent R lung collapse. Repeat bronchoscopy with removal of airway secretions. BAL specimen sent for cytology > atypical cells in background of extensive inflammation 01/07 Transferred to vent SDU bed  INDWELLING DEVICES:: ETT 12/22 >> 12/24, 12/29 >> 01/06 L IJ CVL 12/22 >>  Trach tube 01/06 >>   MICRO DATA: MRSA PCR 12/20 >> NEG Urine 12/20 >> NEG Resp 12/22 >> NOF Blood 12/20 >> NEG Resp 12/29 >> NOF C diff 01/04 >> NEG URine 01/06 >>   >100K multiple organisms Resp (BAL) 01/06 >>  Blood 01/06 >>   ANTIMICROBIALS:  Vanc 12/20 >> 12/22, 01/06 >>  Cefepime 12/20 >> 12/28, 01/06 >>     SUBJECTIVE/OVERNIGHT/INTERVAL HX RASS 0. C/O HA refractory to hydromorphone. Tolerating PS 12 cm H2O  VITAL SIGNS: Temp:  [98.5 F (36.9 C)-101.5 F (38.6 C)] 98.5 F (36.9 C) (01/08 0814) Pulse Rate:  [44-117] 80 (01/08 1139) Resp:  [0-32] 20 (01/08 1139) BP: (107-151)/(45-82) 125/58 mmHg (01/08 1139) SpO2:  [93 %-100 %] 98 % (01/08 1139) FiO2 (%):  [40 %] 40 % (01/08 1137) Weight:  [84.2 kg (185 lb 10 oz)] 84.2 kg (185 lb 10 oz) (01/08 0400) INTAKE / OUTPUT:  Intake/Output Summary (Last 24 hours) at 01/18/14 1352 Last data filed at 01/18/14 0815  Gross per 24 hour  Intake 937.75 ml  Output   1625 ml  Net -687.25 ml    PHYSICAL EXAMINATION: General: NAD on PSV Neuro :RASS 0. No focal deficits HEENT: WNL Cardiovascular: reg, no M Lungs: diminished BS on R, no wheezes Abdomen:  Obese, soft, +bs, suprapubic catheter Ext:  Warm, no edema  LABS:  I have reviewed all of today's lab results. Relevant abnormalities are discussed in the A/P section  CXR: Recurrent collapse/white out of R lung  ASSESSMENT / PLAN:  PULMONARY A: Acute on chronic hypercarbic respiratory failure.  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 PAF this hospitalization. Deemed poor candidate for chronic anticoagulation P:  Cont ASA, statin Holding clopidogrel  Cont amiodarone  RENAL A: Hypernatremia, resolved Hyperkalemia, recurrent AKI, nonoliguric CKD (baseline Cr 1/35)  BPH, prostate  cancer P:   Monitor BMET intermittently Monitor I/Os Correct electrolytes as indicated Cont free water Holding Proscar, flomax, tasigna for now  GASTROINTESTINAL A: Hx of GERD Tracheostomy associated dysphagia P:   SUP: Famotidine Cont TFs per protocol G tube per IR next week  HEMATOLOGIC A: Anemia of chronic disease ICU associated anemia without overt blood loss  CML Thrombocytopenia, resolved  P:  DVT px: SQ heparin Monitor CBC intermittently Transfuse per usual ICU guidelines  INFECTIOUS A: Purulent tracheobronchitis Suspected HCAP, NOS Fever P:   Micro and abx as above DC vanc 01/08 if no MRSA Complete 7-10 days of antibiotics  ENDOCRINE A: DM 2, controlled Hx of gout P:   COnt SSI  NEUROLOGIC A: Acute metabolic encephalopathy, resolved Chronic pain Severe HA 01/08  Severe deconditioning P:   Cont PRN hydromorphone IV ibuprofen X 1 01/08 PT eval Will need LTACH  Merton Border, MD ; [facility] service Mobile [phone].  After 5:30 PM or weekends, call [phone]

## 2014-01-19 ENCOUNTER — Encounter (HOSPITAL_COMMUNITY): Payer: Self-pay | Admitting: Radiology

## 2014-01-19 ENCOUNTER — Inpatient Hospital Stay (HOSPITAL_COMMUNITY): Payer: Medicare HMO

## 2014-01-19 DIAGNOSIS — E46 Unspecified protein-calorie malnutrition: Secondary | ICD-10-CM | POA: Insufficient documentation

## 2014-01-19 LAB — BASIC METABOLIC PANEL
Anion gap: 5 (ref 5–15)
BUN: 45 mg/dL — ABNORMAL HIGH (ref 6–23)
CALCIUM: 8.1 mg/dL — AB (ref 8.4–10.5)
CO2: 27 mmol/L (ref 19–32)
Chloride: 110 mEq/L (ref 96–112)
Creatinine, Ser: 1.44 mg/dL — ABNORMAL HIGH (ref 0.50–1.35)
GFR calc Af Amer: 54 mL/min — ABNORMAL LOW (ref >90)
GFR calc non Af Amer: 47 mL/min — ABNORMAL LOW (ref >90)
Glucose, Bld: 193 mg/dL — ABNORMAL HIGH (ref 70–99)
Potassium: 3.9 mmol/L (ref 3.5–5.1)
Sodium: 142 mmol/L (ref 135–145)

## 2014-01-19 LAB — GLUCOSE, CAPILLARY
GLUCOSE-CAPILLARY: 147 mg/dL — AB (ref 70–99)
GLUCOSE-CAPILLARY: 173 mg/dL — AB (ref 70–99)
Glucose-Capillary: 145 mg/dL — ABNORMAL HIGH (ref 70–99)
Glucose-Capillary: 131 mg/dL — ABNORMAL HIGH (ref 70–99)
Glucose-Capillary: 151 mg/dL — ABNORMAL HIGH (ref 70–99)

## 2014-01-19 LAB — CBC
HCT: 18.8 % — ABNORMAL LOW (ref 39.0–52.0)
HCT: 26.7 % — ABNORMAL LOW (ref 39.0–52.0)
Hemoglobin: 6 g/dL — CL (ref 13.0–17.0)
Hemoglobin: 8.4 g/dL — ABNORMAL LOW (ref 13.0–17.0)
MCH: 29 pg (ref 26.0–34.0)
MCH: 27.8 pg (ref 26.0–34.0)
MCHC: 31.9 g/dL (ref 30.0–36.0)
MCHC: 31.5 g/dL (ref 30.0–36.0)
MCV: 90.8 fL (ref 78.0–100.0)
MCV: 88.4 fL (ref 78.0–100.0)
PLATELETS: 326 10*3/uL (ref 150–400)
Platelets: 376 10*3/uL (ref 150–400)
RBC: 2.07 MIL/uL — ABNORMAL LOW (ref 4.22–5.81)
RBC: 3.02 MIL/uL — AB (ref 4.22–5.81)
RDW: 16.6 % — AB (ref 11.5–15.5)
RDW: 16 % — ABNORMAL HIGH (ref 11.5–15.5)
WBC: 18.4 10*3/uL — AB (ref 4.0–10.5)
WBC: 15.8 10*3/uL — ABNORMAL HIGH (ref 4.0–10.5)

## 2014-01-19 LAB — PREPARE RBC (CROSSMATCH)

## 2014-01-19 MED ORDER — BUTALBITAL-APAP-CAFFEINE 50-325-40 MG PO TABS
1.0000 | ORAL_TABLET | Freq: Four times a day (QID) | ORAL | Status: DC | PRN
  Administered 2014-01-19 – 2014-02-04 (×34): 1 via ORAL
  Filled 2014-01-19 (×40): qty 1

## 2014-01-19 MED ORDER — SODIUM CHLORIDE 0.9 % IV SOLN: Freq: Once | INTRAVENOUS | Status: DC

## 2014-01-19 MED ORDER — CEFAZOLIN SODIUM-DEXTROSE 2-3 GM-% IV SOLR
2.0000 g | INTRAVENOUS | Status: AC
  Administered 2014-01-21: 2 g via INTRAVENOUS
  Filled 2014-01-19: qty 50

## 2014-01-19 MED ORDER — IBUPROFEN 100 MG/5ML PO SUSP
600.0000 mg | Freq: Once | ORAL | Status: AC
  Administered 2014-01-19: 600 mg
  Filled 2014-01-19 (×2): qty 30

## 2014-01-19 NOTE — Progress Notes (Signed)
CRITICAL VALUE ALERT  Critical value received:  Hg 6.0  Date of notification:  01/19/14  Time of notification:  0700  Critical value read back: yes  Nurse who received alert:  Alessandra Grout, RN  MD notified (1st page):  PCCM weekend pager number  Time of first page:  0710  MD notified (2nd page): Dr. Lamonte Sakai  Time of second page: 0715   Responding MD:  Dr. Lamonte Sakai  Time MD responded:  Bev.Pavy  MD will place order for transfusion.

## 2014-01-19 NOTE — Progress Notes (Signed)
01/19/2014 patient continue to c/o throbbing H/A, did let Dr young aware. [facility] RN.

## 2014-01-19 NOTE — H&P (Signed)
Reason for Consult:  Chief Complaint: Chief Complaint  Patient presents with  . Abnormal Lab    Referring Physician(s): PCCM  History of Present Illness: Dale Taylor is a 74 y.o. male admitted with respiratory failure s/p tracheostomy, acute on chronic renal failure, right lung collapse, HCAP and UTI. The patient has a history of prostate cancer, PAF, CAD, combined CHF and was previously on plavix-last dose > 7 days ago. IR was consulted for malnutrition. I have spoke with the patient's family today and they would like to proceed with a percutaneous gastrostomy tube. The patient has been afebrile 1/8 and today and wbc trending down on antibiotics. CT imaging was reviewed and anatomy approved for G-tube with possible barium enema needed at time of procedure.   Past Medical History  Diagnosis Date  . Hypertension   . Hypercholesterolemia   . GERD (gastroesophageal reflux disease)   . Dyspnea on exertion   . Claudication in peripheral vascular disease   . PVD (peripheral vascular disease)   . S/P renal artery angioplasty 2009--- W/ STENT X1  TO LEFT COMMON ILIAC    . ED (erectile dysfunction)   . History of BPH   . Frequency of urination   . Urgency of urination   . Nocturia   . Chronic gouty arthritis FEET  . Diabetes mellitus     type II-- ORAL MED  . Prostate cancer SCHEDULED FOR RADIOACTIVE SEED IMPLANTS OF PROSTATE  05-18-2011    FOLLOWED BY DR [facility] AND DR Valere Dross  . CML (chronic myelocytic leukemia) FOLLOWED BY DR LEWIS ([place])    TAKES SPRYCEL PO MED FOR TX--  PT STATES STABLE  NO SYMPTOMS  . Iron deficiency anemia   . Chronic renal insufficiency   . PAD (peripheral artery disease)   . Cardiovascular disease with arteriosclerosis   . Blood transfusion LAST TIME NOV 2012  --  X2  UNITS  (FOR ANEMIA AND THROMBOCYTOPENIA  . Leukocytosis   . Peripheral edema   . History of thrombocytopenia   . Heart murmur   . CHF (congestive heart failure) ADMISSION NOV  2012  [facility])  REQUESTED RECORDS    DIATOSLIC CHF WITH LEFT VENTRICULAR IMPAIRED RELAXATION-----   CARDIOLOGIST----  DR Dallie Piles  . Hypertension   . Arthritis   . History of angina   . Acute renal failure Admitted to [facility] 06/06/12 with hip fracture; creatinine 4.3, K 7.8. Aldactone and lisinopril stopped    Past Surgical History  Procedure Laterality Date  . Lung lobectomy  AGE 51 (APPROX.)    RIGHT LOWER LOBECTOMY 20 YEARS AGO, NOT CANCER (PROBLEM HEMORRHAGE)  . Hemorrhoid surgery    . Transthoracic echocardiogram  12-05-2010  [facility])    LEFT VENTRICULAR HYPERTROPHY / PRESERVED SYSTOLIC FUNCTION/ EF 57-84%/  IMPAIRED LEFT VENTRICULAR RELAXATION WITH LEFT ATRIAL ENLARGEMENT/ MILD MITRAL AND TRICUSPID REGURG.  . Cataract extraction w/ intraocular lens  implant, bilateral  2012  . Aortogram/ angioplasty and stenting of left common iliac artery  04-07-2007   DR FIELDS    LEFT COMMON ILIAC STENOSIS 80%, STENTED/ BILATERAL OCCLUSION ANTERIOR TIBIAL ARTERY/ BILATERAL INCOMPLETE PLANTAR ARCH FILLED BY PERONEAL AND POSTERIOR TIBIAL ARTERY/ DIFFUSE BUT PATENT SUPERFICIIAL FEMORAL ARTERIES BILATERLLY  . Cardiovascular stress test  05-19-2011  DR ROBERT KRASOWSKI    NO EVIDENCE OF ISCHEMIA/ NORMAL LVSF/ EF 55%  . Radioactive seed implant  07/21/2011    Procedure: RADIOACTIVE SEED IMPLANT;  Surgeon: Joie Bimler, MD;  Location: [place]  [facility];  Service: Urology;  Laterality: N/A;    72   seeds implanted  . Cystoscopy  07/21/2011    Procedure: CYSTOSCOPY;  Surgeon: Joie Bimler, MD;  Location: [facility];  Service: Urology;  Laterality: N/A;  no seeds found in bladder  . Tracheostomy  01/15/14 feinstein    feinstein    Allergies: Review of patient's allergies indicates no known allergies.  Medications: Prior to Admission medications   Medication Sig Start Date End Date Taking? Authorizing Provider  acetaminophen (TYLENOL) 325 MG tablet Take 2  tablets (650 mg total) by mouth every 6 (six) hours as needed. 07/10/12  Yes Adeline Saralyn Pilar, MD  allopurinol (ZYLOPRIM) 300 MG tablet Take 0.5 tablets (150 mg total) by mouth daily. Patient taking differently: Take 300 mg by mouth daily.  07/10/12  Yes Adeline C Viyuoh, MD  amLODipine (NORVASC) 5 MG tablet Take 5 mg by mouth daily.   Yes Historical Provider, MD  aspirin 81 MG chewable tablet Chew 81 mg by mouth daily.    Yes Historical Provider, MD  atorvastatin (LIPITOR) 20 MG tablet Take 20 mg by mouth daily.   Yes Historical Provider, MD  clopidogrel (PLAVIX) 75 MG tablet Take 75 mg by mouth daily.   Yes Historical Provider, MD  finasteride (PROSCAR) 5 MG tablet Take 5 mg by mouth daily.   Yes Historical Provider, MD  FLORA-Q [facility]) CAPS capsule Take 1 capsule by mouth 2 (two) times daily.   Yes Historical Provider, MD  furosemide (LASIX) 40 MG tablet Take 40 mg by mouth daily.   Yes Historical Provider, MD  guaiFENesin (MUCINEX) 600 MG 12 hr tablet Take 1,200 mg by mouth 2 (two) times daily.   Yes Historical Provider, MD  hydrALAZINE (APRESOLINE) 10 MG tablet Take 10 mg by mouth 3 (three) times daily.   Yes Historical Provider, MD  insulin regular (NOVOLIN R,HUMULIN R) 100 units/mL injection Inject 0-10 Units into the skin 4 (four) times daily -  before meals and at bedtime. 151-200= 2 units 201-250= 4 units 251-300= 6 units 301-350= 8 units 351-400=10 units >400= call MD   Yes Historical Provider, MD  ipratropium-albuterol (DUONEB) 0.5-2.5 (3) MG/3ML SOLN Take 3 mLs by nebulization every 6 (six) hours as needed (wheezing).   Yes Historical Provider, MD  isosorbide mononitrate (IMDUR) 30 MG 24 hr tablet Take 30 mg by mouth every morning.    Yes Historical Provider, MD  metoprolol (LOPRESSOR) 12.5 mg TABS Take 1.5 tablets (37.5 mg total) by mouth 2 (two) times daily. Patient taking differently: Take 12.5 mg by mouth 2 (two) times daily.  07/10/12  Yes Adeline Saralyn Pilar, MD  nilotinib  (TASIGNA) 150 MG capsule Take 300 mg by mouth every 12 (twelve) hours.   Yes Historical Provider, MD  oxyCODONE (OXY IR/ROXICODONE) 5 MG immediate release tablet Take 5 mg by mouth 3 (three) times daily.   Yes Historical Provider, MD  pantoprazole (PROTONIX) 40 MG tablet Take 40 mg by mouth daily.   Yes Historical Provider, MD  pregabalin (LYRICA) 100 MG capsule Take 100 mg by mouth 2 (two) times daily.   Yes Historical Provider, MD  ranolazine (RANEXA) 500 MG 12 hr tablet Take 500 mg by mouth 2 (two) times daily.   Yes Historical Provider, MD  sennosides-docusate sodium (SENOKOT-S) 8.6-50 MG tablet Take 2 tablets by mouth 2 (two) times daily.   Yes Historical Provider, MD  Tamsulosin HCl (FLOMAX) 0.4 MG CAPS Take 0.4 mg by mouth 2 (two) times  daily.    Yes Historical Provider, MD  famotidine (PEPCID) 40 MG tablet Take 1 tablet (40 mg total) by mouth daily. Patient not taking: Reported on 01/05/2014 07/10/12   Sheila Oats, MD  feeding supplement (ENSURE) PUDG Take 1 Container by mouth 3 (three) times daily between meals. Patient not taking: Reported on 12/28/2013 07/10/12   Sheila Oats, MD  feeding supplement (GLUCERNA SHAKE) LIQD Take 237 mLs by mouth daily at 12 noon. Patient not taking: Reported on 12/28/2013 07/11/12   Sheila Oats, MD  iron polysaccharides (NIFEREX) 150 MG capsule Take 1 capsule (150 mg total) by mouth daily. Patient not taking: Reported on 12/12/2013 07/10/12   Sheila Oats, MD  magnesium oxide (MAG-OX) 400 (241.3 MG) MG tablet Take 1 tablet (400 mg total) by mouth daily. Patient not taking: Reported on 01/09/2014 07/10/12   Sheila Oats, MD  meclizine (ANTIVERT) 25 MG tablet Take 25 mg by mouth 3 (three) times daily as needed for dizziness.    Historical Provider, MD  sucralfate (CARAFATE) 1 GM/10ML suspension Take 10 mLs (1 g total) by mouth 4 (four) times daily -  with meals and at bedtime. Patient not taking: Reported on 01/01/2014 07/10/12   Sheila Oats, MD  traMADol (ULTRAM) 50 MG tablet Take 1 tablet (50 mg total) by mouth every 6 (six) hours as needed. 07/10/12   Sheila Oats, MD    Family History  Problem Relation Age of Onset  . Cancer Maternal Aunt     UNKNOWN CANCER DIED AGE 71    History   Social History  . Marital Status: Married    Spouse Name: ERNESTINE    Number of Children: 7  . Years of Education: N/A   Occupational History  . RETIRED    Social History Main Topics  . Smoking status: Former Smoker -- 1.50 packs/day for 20 years    Types: Cigarettes    Quit date: 11/19/1978  . Smokeless tobacco: Never Used  . Alcohol Use: No  . Drug Use: No  . Sexual Activity: None   Other Topics Concern  . None   Social History Narrative    Review of Systems: A 12 point ROS discussed and pertinent positives are indicated in the HPI above.  All other systems are negative.  Review of Systems  Vital Signs: BP 122/52 mmHg  Pulse 68  Temp(Src) 99.5 F (37.5 C) (Oral)  Resp 22  Ht 5\' 11"  (1.803 m)  Wt 185 lb 10 oz (84.2 kg)  BMI 25.90 kg/m2  SpO2 100%  Physical Exam General: Awake, NAD, NGT intact, Tracheostomy intact, family in room Heart: RRR without M/G/R Lungs: CTA b/l Abd: Soft, NT, ND, (+) BS  Imaging: Ct Abdomen Wo Contrast  01/17/2014   CLINICAL DATA:  74 year old male with clinical history of malnutrition. Evaluate anatomy for possible G-tube placement.  EXAM: CT ABDOMEN WITHOUT CONTRAST  TECHNIQUE: Multidetector CT imaging of the abdomen was performed following the standard protocol without IV contrast.  COMPARISON:  Prior CT scan of the chest 01/02/2014  FINDINGS: Lower Chest: Stable appearance of the visualized lower lungs including postsurgical change and chronic atelectasis, scarring and architectural distortion in the right lower lobe. Decreasing loculated left pleural effusion. Improving scattered patchy infiltrate in the left upper and lower lobes. Cavitation with relatively thin but  irregular walls in the left lower lobe 3.0 x 2.3 cm. While this is technically enlarged compared to 2.7 x 2.0 cm previously this is  due mostly to decreased atelectasis and compression of the structure.  Enlarged main pulmonary artery as can be seen in the setting of pulmonary arterial hypertension. Atherosclerotic calcifications are present throughout the coronary arteries. The intracardiac blood pool is hypodense relative to the adjacent myocardium consistent with anemia. No pericardial effusion. An enteric feeding tube is present.  Abdomen: Enteric feeding tube with the weighted tip terminating in the gastric body. The transverse colon is position directly anterior to the stomach. There is no significant hiatal hernia or other abnormal anatomy that would preclude gastric tube placement. Normal size of the left hepatic lobe which does not extend significantly over the gastric body.  Unremarkable CT appearance of the stomach, duodenum, spleen, adrenal glands and pancreas. Normal hepatic contour and morphology. Single punctate calcification in the right hepatic lobe. No other discrete lesion. Gallbladder is unremarkable. No intra or extrahepatic biliary ductal dilatation. Unremarkable appearance of the bilateral kidneys. No focal solid lesion, hydronephrosis or nephrolithiasis. No evidence of obstruction or focal bowel wall thickening. Normal appendix in the right lower quadrant. The terminal ileum is unremarkable.  Bones/Soft Tissues: No acute fracture or aggressive appearing lytic or blastic osseous lesion. Right worse than left lower lumbar facet arthropathy.  Vascular: Atherosclerotic vascular disease without significant stenosis or aneurysmal dilatation.  IMPRESSION: 1. No clinically significant anatomic abnormality that would preclude gastric tube placement. 2. Relatively high position of the transverse colon which is directly anterior to the stomach. At the time of tube placement, if the transverse colon is  not easily identified by the presence of intra colonic gas, administration of rectal contrast may be warranted. 3. Improving patchy airspace disease in the left upper and lower lobes compared to 01/02/2014. 4. Decreasing partially loculated left pleural effusion compared to 01/02/2014. 5. Atherosclerotic vascular calcifications. 6. Essentially stable cavitary lesion in the left lower lobe. 7. The intracardiac blood pool is hypodense relative to the adjacent myocardium consistent with anemia. Signed,  Criselda Peaches, MD  [facility]  [facility]   Electronically Signed   By: Jacqulynn Cadet M.D.   On: 01/17/2014 12:00   Dg Chest 2 View  01/05/2014   CLINICAL DATA:  Shortness of breath, altered mental status.  EXAM: CHEST  2 VIEW  COMPARISON:  Chest radiograph 12/14/2013  FINDINGS: Stable enlarged cardiac and mediastinal contours. Unchanged peripheral density within the left mid lung. Postoperative changes within the right hemi thorax. There is increased consolidative opacity involving the right lung with decreased aeration. Probable moderate right pleural effusion.  IMPRESSION: Increase consolidative opacity within the right lung.  Likely increasing moderate right pleural effusion.   Electronically Signed   By: Lovey Newcomer M.D.   On: 12/18/2013 12:32   Ct Chest Wo Contrast  01/02/2014   CLINICAL DATA:  Left pleural effusion  EXAM: CT CHEST WITHOUT CONTRAST  TECHNIQUE: Multidetector CT imaging of the chest was performed following the standard protocol without IV contrast material administration.  COMPARISON:  Chest radiograph January 01, 2014 and chest CT December 01, 2013  FINDINGS: Endotracheal tube tip is in the distal trachea.  No pneumothorax.  There is extensive volume loss on the right which in part is due to postoperative change. Compared to the previous CT, however, there has been interval re-expansion of much of the right upper lobe. There are  pleural effusions bilaterally, slightly larger on the left than on the right. Effusions are for the most part free-flowing, although there is some loculation on the right inferiorly.  There remains consolidation with air bronchograms in in portions of the right middle and lower lobes. There is patchy airspace consolidation throughout much of the left lower lobe and inferior lingula, increased from prior CT and not felt to be changed compared to 1 day prior. There is also focal consolidation in a portion of the posterior segment of the left upper lobe.  Compared to the previous CT, the cavitary lesion in the left lower lobe is present but difficult to discern due to overlying infiltrate. This cavitary lesion measures 2.8 x 2 cm. The previously noted lentiform shaped lesion along the left pleura at the left mid hemithorax level measures 4.1 x 2.6 cm, not significantly changed.  Central catheter tip is in the superior vena cava. There are multiple small mediastinal lymph nodes but no frank adenopathy by size criteria. Pericardium is not thickened. There are multiple foci of coronary artery calcification.  Nasogastric tube extends into the stomach.  In the visualized upper abdomen, there is either sludge or small gallstones in the gallbladder. There is atherosclerotic change in the aorta and right renal artery.  There is degenerative change in the thoracic spine. There are no blastic or lytic bone lesions. Thyroid appears unremarkable. Gynecomastia again noted.  IMPRESSION: Postoperative change on the right with extensive volume loss. There is consolidation in some of the remaining right lung, although much of the remaining right upper lobe has re-expanded compared to 1 month prior. On the left, there is a moderate free-flowing effusion with fairly extensive consolidation throughout much of the left lower lobe and portions of the inferior lingula. There is also some patchy airspace disease in the posterior segment of the  left upper lobe.  The cavitary lesion in the left lower lobe is again noted and not felt to be significantly changed. It is difficult to discern from the surrounding infiltrate which now surrounds it. This lesion is of uncertain etiology. Cavitary neoplasm is of concern. A focal mycetoma is a differential consideration. The lentiform shaped mass along the left pleura appears stable as well.  Sludge or small gallstones present in gallbladder. Gallbladder wall does not appear thickened.  Gynecomastia.  Tube and catheter positions as described without pneumothorax.   Electronically Signed   By: Lowella Grip M.D.   On: 01/02/2014 07:52   US Renal Port  12/31/2013   CLINICAL DATA:  Renal failure  EXAM: RENAL/URINARY TRACT ULTRASOUND COMPLETE  COMPARISON:  Renal ultrasound 06/30/2012  FINDINGS: Right Kidney:  Length: 10.9 cm. Echogenicity within normal limits. No mass or hydronephrosis visualized.  Left Kidney:  Length: 10.6 cm. Echogenicity within normal limits. No mass or hydronephrosis visualized.  Bladder:  Foley catheter.  Urine bladder empty.  IMPRESSION: Negative renal ultrasound.   Electronically Signed   By: Franchot Gallo M.D.   On: 12/31/2013 09:59   Dg Chest Port 1 View  01/19/2014   CLINICAL DATA:  Follow-up respiratory failure. Subsequent encounter.  EXAM: PORTABLE CHEST - 1 VIEW  COMPARISON:  Chest radiograph performed 01/18/2014  FINDINGS: There is persistent near complete opacification of the right hemithorax, likely reflecting combination of a large right pleural effusion and diffuse airspace opacification. Persistent left basilar airspace opacification is noted. Vascular congestion is seen. No pneumothorax is identified.  The cardiomediastinal silhouette is difficult to assess due to the large right-sided pleural effusion. It appears grossly normal in size. Postoperative change is noted overlying the right hilum. The patient's tracheostomy tube is seen ending 7 cm above the carina. A left IJ  line is noted ending about the proximal to mid SVC. No acute osseous abnormalities are identified.  IMPRESSION: Stable near complete opacification of the right hemithorax likely reflects a combination of a large right-sided pleural effusion and diffuse airspace opacification. Persistent left sided airspace opacity seen. Findings raise concern for multifocal pneumonia, though pulmonary edema might have a similar appearance. Vascular congestion noted.   Electronically Signed   By: Garald Balding M.D.   On: 01/19/2014 08:17   Dg Chest Port 1 View  01/18/2014   CLINICAL DATA:  Shortness of breath  EXAM: PORTABLE CHEST - 1 VIEW  COMPARISON:  01/17/2014  FINDINGS: Cardiac shadow remains mildly enlarged. There is complete opacification of the right hemi thorax when compared with the previous day. This is likely related to a combination of pleural effusion and mucous plugging. Postsurgical changes are noted on the right. The left lung remains well aerated. Patchy infiltrate is noted in the lateral left lung base which is new from the prior exam. A tracheostomy tube is seen. Feeding catheter is noted extending into the stomach.  IMPRESSION: New left basilar infiltrate.  New opacification of the right hemi thorax likely related to a combination of pleural effusion and mucous plugging.  These results will be called to the ordering clinician or representative by the Radiologist Assistant, and communication documented in the PACS or zVision Dashboard.   Electronically Signed   By: Inez Catalina M.D.   On: 01/18/2014 07:19   Dg Chest Port 1 View  01/17/2014   CLINICAL DATA:  Shortness of breath.  Respiratory failure.  EXAM: PORTABLE CHEST - 1 VIEW  COMPARISON:  01/16/2014.  FINDINGS: Tracheostomy tube left IJ line, feeding tube in stable position. Tip of the feeding tube is in the proximal stomach. Prior right upper lobectomy. Improved aeration both lungs. Stable cardiomegaly. No pulmonary venous congestion.  IMPRESSION: 1.  Lines and tubes in stable position. Tip of the feeding tube is in the proximal stomach. 2. Right upper lobectomy.  Improved aeration of both lungs. 3. Stable cardiomegaly.   Electronically Signed   By: Marcello Moores  Register   On: 01/17/2014 07:33   Dg Chest Port 1 View  01/16/2014   CLINICAL DATA:  Acute respiratory distress. Status post bronchoscopy.  EXAM: PORTABLE CHEST - 1 VIEW  COMPARISON:  01/16/2014 and prior radiographs.  FINDINGS: A tracheostomy tube, left IJ central venous catheter with tip overlying the upper SVC and small bore feeding tube with tip overlying the proximal stomach noted.  Improved aeration of the right lower lung identified.  There is no evidence of pneumothorax.  Increasing interstitial opacities within the left lung likely represents mild edema.  IMPRESSION: Increasing left lung interstitial opacities suspicious for edema.  Improved aeration of the right lower lung.  No evidence of pneumothorax.   Electronically Signed   By: Hassan Rowan M.D.   On: 01/16/2014 17:58   Dg Chest Port 1 View  01/16/2014   CLINICAL DATA:  Tracheostomy evaluation  EXAM: PORTABLE CHEST - 1 VIEW  COMPARISON:  156 cm  FINDINGS: Tracheostomy tube projects over the tracheal air column. Feeding tube crosses the gastroesophageal junction. No change in position of left internal jugular central line with tip over the superior vena cava.  Much of the cardiac silhouette is obscured by extensive right thoracic density. There is extensive opacification over the right lung with some aerated right upper lobe of lung. There is also right upper lobe postoperative change. Mildly increased opacity when compared to prior  study. Left lung is clear except for minimal discoid atelectasis at the base.  IMPRESSION: Worsening opacities right upper lobe and lower lobe consistent with further worsening collapse.   Electronically Signed   By: Skipper Cliche M.D.   On: 01/16/2014 13:09   Dg Chest Port 1 View  01/15/2014   CLINICAL DATA:   respiratory failure.  Tracheostomy placement  EXAM: PORTABLE CHEST - 1 VIEW  COMPARISON:  01/14/2014  FINDINGS: Interval placement of tracheostomy in good position.  Left jugular catheter tip in the SVC unchanged  Progression of right lower lobe collapse and effusion. There is also some right upper lobe collapse. Surgical clips in the right upper lobe. Left lung remains clear. No pneumothorax  Satisfactory  IMPRESSION: Satisfactory tracheostomy placement.  Worsening collapse in the right upper lobe and right lower lobe.   Electronically Signed   By: Franchot Gallo M.D.   On: 01/15/2014 14:38   Dg Chest Port 1 View  01/14/2014   CLINICAL DATA:  74 year old male with shortness of breath. High blood pressure. Subsequent encounter.  EXAM: PORTABLE CHEST - 1 VIEW  COMPARISON:  01/13/2014 in 06/15/2012 chest x-ray.  FINDINGS: Post right lobectomy with right sided fine loss and mediastinal shift to the right with elevated right hemidiaphragm. Difficult by plain film examination to exclude superimposed infectious infiltrate or recurrent lung cancer. Findings are relatively similar to the 06/15/2012 exam.  Endotracheal tube tip unchanged approximately 3.8 cm above the expected level of the carina.  Left central line tip proximal superior vena caval level directed laterally.  Nasogastric tube courses below the diaphragm. Tip is not included on the present exam.  Pulmonary vascular congestion.  Prior CT detected pulmonary parenchymal changes involving the left upper lobe and left lower lobe not as well delineated on the present examination suggesting interval partial clearing.  No gross pneumothorax.  Rotated mediastinal.  IMPRESSION: Postsurgical changes right thorax as noted above.  Pulmonary vascular prominence.  Relatively recent CT detected pulmonary parenchymal changes involving the left upper lobe and left lower lobe are not as well delineated on the current examination suggesting interval partial clearing.    Electronically Signed   By: Chauncey Cruel M.D.   On: 01/14/2014 07:44   Dg Chest Port 1 View  01/13/2014   CLINICAL DATA:  Acute respiratory failure.  EXAM: PORTABLE CHEST - 1 VIEW  COMPARISON:  01/11/2014  FINDINGS: Support apparatus: Endotracheal tube tip 4.5 cm above carina. Nasogastric tube extends beyond the inferior aspect of the film. Left internal jugular line tip high SVC.  Cardiomediastinal silhouette: Surgical changes in the right suprahilar region. Normal heart size.  Pleura: Moderate fat hemidiaphragm elevation, secondary to volume loss in the right hemi thorax. Left costophrenic angle minimally excluded. No pleural effusion or pneumothorax.  Lungs: Right base atelectasis. Improvement in right upper lobe/ apical aeration.  Other: None  IMPRESSION: Improved right upper lobe aeration. Postoperative volume loss in the right hemi thorax, without acute superimposed process.   Electronically Signed   By: Abigail Miyamoto M.D.   On: 01/13/2014 08:31   Dg Chest Port 1 View  01/11/2014   CLINICAL DATA:  ET tube position  EXAM: PORTABLE CHEST - 1 VIEW  COMPARISON:  01/10/2014  FINDINGS: Support devices are in stable position.  Postoperative changes on the right. Stable right basilar opacity, likely atelectasis. No confluent opacity on the left. Suspect mild cardiomegaly. Volume loss on the right. Increasing right upper lobe atelectasis or infiltrate. Right basilar opacity likely reflects atelectasis  and is stable.  IMPRESSION: Postoperative changes on the right with volume loss. Increasing right upper lobe atelectasis or infiltrate.   Electronically Signed   By: Rolm Baptise M.D.   On: 01/11/2014 09:15   Dg Chest Port 1 View  01/10/2014   CLINICAL DATA:  Subsequent evaluation for endotracheal tube  EXAM: PORTABLE CHEST - 1 VIEW  COMPARISON:  01/09/2014  FINDINGS: Endotracheal tube tip about 2 cm above the carinal. Enteric tube crosses the gastroesophageal junction and into the stomach. No change in left  internal jugular central line with tip over the superior vena cava.  Perihilar left lung infiltrate shows moderate improvement. However there remains left base opacity suggesting consolidation and effusion. Right diaphragm is elevated. Postsurgical change right upper lobe stable.  IMPRESSION: Bilateral changes as described, with moderate improvement in left perihilar infiltrate.   Electronically Signed   By: Skipper Cliche M.D.   On: 01/10/2014 07:45   Dg Chest Port 1 View  01/09/2014   CLINICAL DATA:  Respiratory failure.  EXAM: PORTABLE CHEST - 1 VIEW  COMPARISON:  01/08/2014.  FINDINGS: Endotracheal tube, NG tube, left IJ line in stable position. Surgical sutures noted over the right upper lung. Persistent diffuse left lung infiltrate. Right base atelectasis versus infiltrate. Small right pleural effusion. No pneumothorax.  IMPRESSION: 1. Lines and tubes in stable position. 2. Persistent diffuse left lung infiltrate. 3. Right base atelectasis and/or infiltrate. 4. Small right pleural effusion. 5. Surgical sutures right upper lung.   Electronically Signed   By: Marcello Moores  Register   On: 01/09/2014 07:46   Dg Chest Port 1 View  01/08/2014   CLINICAL DATA:  Hypoxia  EXAM: PORTABLE CHEST - 1 VIEW  COMPARISON:  Study obtained earlier in the day  FINDINGS: Endotracheal tube tip is 2.4 cm above the carina. Central catheter tip is at the junction of the left innominate vein and superior vena cava. Nasogastric tube tip and side port extend below the diaphragm. No pneumothorax. There is considerably less effusion on the right compared to earlier in the day. There is patchy infiltrate throughout the left lung fairly diffuse, stable. There is consolidation in the medial right base. Heart is upper normal in size with pulmonary vascular within normal limits. There is postoperative change on the right with volume loss.  IMPRESSION: Tube and catheter positions as described without pneumothorax. Widespread infiltrate on the  left, stable. Considerably less opacity on the right compared to earlier in the day. Volume loss with right base consolidation present. No change in cardiac silhouette.   Electronically Signed   By: Lowella Grip M.D.   On: 01/08/2014 11:39   Dg Chest Port 1 View  01/08/2014   CLINICAL DATA:  Respiratory failure.  EXAM: PORTABLE CHEST - 1 VIEW  COMPARISON:  12/28/2013.  FINDINGS: Left IJ line in stable position. Postsurgical changes right hilum. Persistent atelectasis at the right lung. Right pleural effusion. Progressive diffuse infiltrate left lung. Heart size normal. No pneumothorax. No acute osseus abnormality.  IMPRESSION: 1. Left IJ line stable position. 2. Postsurgical changes right lung. 3. Persistent atelectasis of the right lung and persistent right pleural effusion. 4. Persistent diffuse infiltrate in the left lung with progression from prior exam.   Electronically Signed   By: [place]   On: 01/08/2014 07:45   Dg Chest Port 1 View  01/07/2014   CLINICAL DATA:  Mucous plugging.  EXAM: PORTABLE CHEST - 1 VIEW  COMPARISON:  01/06/2014.  FINDINGS: Left IJ line in  stable position. Postsurgical changes right hilum. Progressive atelectasis right lung. Right pleural effusion. Diffuse mild infiltrate left lung. Heart size is difficult to evaluate due to atelectasis of the right lung and shift of the heart mediastinum to the right. No acute bony abnormality.  IMPRESSION: 1. Left IJ line in stable position. 2. Progressive atelectasis of the right lung. Persistent right pleural effusion. 3. Diffuse left lung pulmonary infiltrate.   Electronically Signed   By: Marcello Moores  Register   On: 01/07/2014 07:24   Dg Chest Port 1 View  01/06/2014   CLINICAL DATA:  Mucous plugging lobe bronchi  EXAM: PORTABLE CHEST - 1 VIEW  COMPARISON:  01/05/2014  FINDINGS: Cardiomediastinal silhouette is stable. Persistent almost complete opacification of the right hemi thorax. Stable postsurgical changes right perihilar  region. There is worsening in aeration of left lung probable due to edema, infiltrate with small left pleural effusion. Stable left IJ catheter position. Stable loculated peripheral right pleural effusion.  IMPRESSION: Persistent almost complete opacification of the right hemi thorax. Stable postsurgical changes right perihilar region. There is worsening in aeration of left lung probable due to edema or infiltrate with small left pleural effusion.   Electronically Signed   By: Lahoma Crocker M.D.   On: 01/06/2014 10:31   Dg Chest Port 1 View  01/05/2014   CLINICAL DATA:  Mucous plugging.  EXAM: PORTABLE CHEST - 1 VIEW  COMPARISON:  01/04/2014 and 01/03/2014  FINDINGS: Left IJ central venous catheter is unchanged with tip obliquely oriented in the expected region of the SVC. There are continued postsurgical changes of the right hemi thorax. Near complete opacification of the right long with slightly better aeration over the mid to upper lung compared to the recent prior exam. Opacification over the left lung in the perihilar region with suggestion of left pleural effusion slightly worse. Remainder of the exam is unchanged.  IMPRESSION: Continued near complete opacification of the right lung with slightly improved aeration over the mid to upper lung. Slight worsening of hazy opacification over the left perihilar region with left effusion.  Left IJ central venous catheter unchanged.   Electronically Signed   By: Marin Olp M.D.   On: 01/05/2014 08:41   Dg Chest Port 1 View  01/04/2014   CLINICAL DATA:  Pneumonia  EXAM: PORTABLE CHEST - 1 VIEW  COMPARISON:  01/03/2014  FINDINGS: Cardiac shadow is stable. A left jugular central venous line is again seen. The endotracheal tube and nasogastric catheter have been removed. There is significant decreased aeration identified in the right lung when compare with the previous day. Significant increase in right-sided pleural effusion is noted as well. The left lung is well  aerated with some patchy edematous changes.  IMPRESSION: Status post endotracheal tube and nasogastric catheter removal  Significant increase in the degree of right-sided pleural effusion with decreased aeration in the right lung.  Mild edematous changes in the left lung.   Electronically Signed   By: Inez Catalina M.D.   On: 01/04/2014 07:32   Dg Chest Port 1 View  01/03/2014   CLINICAL DATA:  Respiratory failure  EXAM: PORTABLE CHEST - 1 VIEW  COMPARISON:  01/02/2014  FINDINGS: Endotracheal tube in good position. Left jugular catheter tip in the SVC. No pneumothorax. NG tube tip in the stomach.  Improved aeration right upper lobe with resolution of right upper lobe collapse.  Bibasilar atelectasis remains with small pleural effusions. Negative for edema.  IMPRESSION: Improvement in right upper lobe collapse.  Mild  bibasilar atelectasis and small bilateral effusions are present.   Electronically Signed   By: Franchot Gallo M.D.   On: 01/03/2014 07:16   Dg Chest Port 1 View  01/02/2014   CLINICAL DATA:  Right lobectomy intubated patient  EXAM: PORTABLE CHEST - 1 VIEW  COMPARISON:  Portable chest x-ray of January 01, 2014  FINDINGS: There remains volume loss on the right. There is pleural thickening and fluid surrounding the aerated portion of the right lung. There is mediastinal shift towards the right. On the left the lung is mildly hyperinflated. The interstitial markings are increased though stable. There are stable peripheral confluent densities in the mid and lower left lung. The cardiac silhouette is top-normal in size. The pulmonary vascularity is prominent centrally.  The endotracheal tube tip projects 3.3 cm above the crotch of the carina. The esophagogastric tube tip projects below the inferior margin of the image. The left internal jugular venous catheter tip crosses the midline and likely lies at the level of the medial portion of the brachiocephalic vein. This is stable.  IMPRESSION: 1. Stable  appearance of the hypoinflated right lung with post lobectomy changes. Pleural fluid surrounds the lung and is unchanged. 2. On the left airspace opacity at the lung base persists and density peripherally in the left mid lung most compatible with loculated pleural effusion is unchanged. 3. The support tubes and lines are in appropriate position.   Electronically Signed   By: David  Martinique   On: 01/02/2014 08:00   Portable Chest Xray  01/01/2014   CLINICAL DATA:  LEFT central line placement, intubation, respiratory failure  EXAM: PORTABLE CHEST - 1 VIEW  COMPARISON:  Portable exam at 1422 hr compared to 0511 hr and correlated to CT chest of 12/01/2013  FINDINGS: Tip of endotracheal tube projects approximately 3.8 cm above carina.  LEFT jugular central venous catheter tip projects over SVC.  Rotated to the RIGHT.  Nasogastric tube extends into the abdomen/stomach.  Decreased atelectasis in RIGHT lung though significant upper lobe atelectasis and postsurgical changes are still identified.  Large RIGHT pleural effusion.  Scattered airspace infiltrate mid to lower LEFT lung.  Peripheral pleural-based opacity in the lateral LEFT chest could represent loculated effusion or mass, unchanged from prior CT.  Tiny LEFT pleural effusion.  No pneumothorax.  IMPRESSION: Improving aeration within RIGHT lung no significant atelectasis in large RIGHT pleural effusion persists.  Persistent infiltrate and small pleural effusion at lower LEFT chest with again identified pleural-based mass versus loculated fluid at the lateral mid LEFT chest.   Electronically Signed   By: Lavonia Dana M.D.   On: 01/01/2014 14:49   Dg Chest Port 1 View  01/01/2014   CLINICAL DATA:  74 year old male with respiratory failure, Healthcare associated pneumonia. Remote history of right lower lobectomy. Chronic heart failure. Initial encounter.  EXAM: PORTABLE CHEST - 1 VIEW  COMPARISON:  01/07/2014 and earlier.  FINDINGS: Portable AP semi upright view at  0511 hrs. Interval recurrent complete opacification of the right hemi thorax. Staple lines re- identified about the right hilum. New confluent peripheral left lower lung opacity, superimposed on small partially loculated pleural effusion (as seen on 12/01/2013 CT). No superimposed pneumothorax. Visualized mediastinal contours are stable. Stable tracheal air column.  IMPRESSION: 1. Recurrent complete opacification of the right hemi thorax. 2. New confluent left lower lung opacity suspicious for aspiration or progression of pneumonia.   Electronically Signed   By: Lars Pinks M.D.   On: 01/01/2014 07:32  Dg Abd Portable 1v  01/15/2014   CLINICAL DATA:  Check feeding tube placement  EXAM: PORTABLE ABDOMEN - 1 VIEW  COMPARISON:  01/08/2014  FINDINGS: The feeding tube is now seen extending into the stomach. Nonobstructive bowel gas pattern is noted. Patchy changes are again seen in the right lung base stable from the prior exam. There has been some improvement in the left lung when compared with the prior study.  IMPRESSION: Feeding catheter within the stomach.   Electronically Signed   By: Inez Catalina M.D.   On: 01/15/2014 17:52   Dg Abd Portable 1v  01/08/2014   CLINICAL DATA:  74 year old NG tube placement.  EXAM: PORTABLE ABDOMEN - 1 VIEW  COMPARISON:  01/01/2014  FINDINGS: An endotracheal tube tip is approximately 1.5 cm above the carina.  An enteric tube tip is seen within the distal stomach. Side port is within the region of the gastric body.  The partially visualized chest demonstrates sequela of prior surgery. The cardiac silhouette and mediastinal contours are unchanged. Patchy areas of consolidation are noted bilaterally. Right pleural thickening is also unchanged.  IMPRESSION: 1. Enteric tube tip projects over the distal stomach. 2. Endotracheal tube tip is approximately 1.5 cm above the carina. 3. Partially visualized chest demonstrates patchy areas of consolidation, question multifocal pneumonia. 4.  Small right pleural effusion.   Electronically Signed   By: Rosemarie Ax   On: 01/08/2014 16:41   Dg Abd Portable 1v  01/01/2014   CLINICAL DATA:  Nasogastric tube placement.  EXAM: PORTABLE ABDOMEN - 1 VIEW  COMPARISON:  None.  FINDINGS: A nasogastric tube is seen which is looped within the proximal stomach. Endotracheal tube also seen with tip approximately 1 cm above carina. Right pleural effusion noted.  IMPRESSION: Nasogastric tube in proximal stomach. Endotracheal tube tip approximately 1 cm above carina.   Electronically Signed   By: Earle Gell M.D.   On: 01/01/2014 14:50    Labs:  CBC:  Recent Labs  01/16/14 0315 01/17/14 0500 01/18/14 0600 01/19/14 0515  WBC 8.5 29.1* 21.1* 18.4*  HGB 8.2* 8.2* 7.9* 6.0*  HCT 26.3* 27.1* 25.2* 18.8*  PLT 432* 425* 359 376    COAGS:  Recent Labs  01/14/14 1600  INR 1.18  APTT 33    BMP:  Recent Labs  01/17/14 0500 01/17/14 1200 01/18/14 0600 01/19/14 0515  NA 143 142 140 142  K 4.9 4.5 3.8 3.9  CL 112 111 106 110  CO2 27 24 26 27   GLUCOSE 243* 220* 179* 193*  BUN 46* 42* 43* 45*  CALCIUM 8.0* 8.0* 8.0* 8.1*  CREATININE 1.76* 1.69* 1.58* 1.44*  GFRNONAA 37* 38* 42* 47*  GFRAA 42* 45* 48* 54*    LIVER FUNCTION TESTS:  Recent Labs  12/25/2013 1230 01/01/14 0553 01/02/14 0500 01/16/14 0500  BILITOT 0.5 0.5 0.5 0.6  AST 20 29 22  32  ALT 27 36 28 37  ALKPHOS 106 95 76 81  PROT 6.0 5.3* 4.8* 4.8*  ALBUMIN 3.0* 3.0* 2.4* 2.4*   Assessment and Plan: Respiratory failure s/p tracheostomy Acute on chronic renal failure  Right lung collapse, HCAP and UTI on antibiotics, afebrile 1/8-1/9, wbc trending down 18.4k today. History of Prostate cancer PAF this admission on amiodarone-now in NSR, not on anticoagulation History of CAD and combined CHF and was previously on plavix-last dose > 7 days ago Anemia, Hgb 6.0 may require transfusion.  Malnutrition, seen for percutaneous gastrostomy tube placement with moderate  sedation on Monday  01/21/13 if remains afebrile and wbc continues to trend down.  CT imaging reviewed, may require BE at time of procedure, labs reviewed Risks and Benefits discussed with the patient and his family today as well as his daughter Philis Nettle over the phone All questions were answered, patient's family agreeable to proceed. Consent signed and in IR.   Thank you for this interesting consult.  I greatly enjoyed meeting Dale Taylor and look forward to participating in their care.    I spent a total of 40 minutes face to face in clinical consultation, greater than 50% of which was counseling/coordinating care  Signed: Hedy Jacob 01/19/2014, 11:53 AM

## 2014-01-19 NOTE — Progress Notes (Signed)
eLink Physician-Brief Progress Note Patient Name: Dale Taylor DOB: [birth year] MRN: [id number]   Date of Service  01/19/2014  HPI/Events of Note  C/o headache.  No improvement with opiates, acetaminophen.  eICU Interventions  Will try ibuprofen 600 mg x one.     Intervention Category Major Interventions: Other:  Tamarius Rosenfield 01/19/2014, 8:44 PM

## 2014-01-19 NOTE — Progress Notes (Signed)
01/19/2014 Dr Halford Chessman in [place]  was made aware of patient H/A. Order was given for one time dose of Ibuprofen. [facility] RN.

## 2014-01-19 NOTE — Progress Notes (Signed)
Pt has headache did not want CPT

## 2014-01-19 NOTE — Progress Notes (Signed)
PULMONARY / CRITICAL CARE MEDICINE   Name: Dale Taylor MRN: [id number] DOB: 1940/03/20    ADMISSION DATE:  12/23/2013 CONSULTATION DATE:  12/20  REFERRING MD :  FPTS  CHIEF COMPLAINT:  Respiratory failure    INITIAL PRESENTATION:  74 yo male CRI, prostate ca admitted with A on C renal failure, hyperkalemia, HCAP, UTI.    STUDIES/EVENTS:  12/20 Admitted to [place] wit dx of PNA 12/20 Transferred to ICU with respiratory acidosis, hypersomnolence. PCCM consultation and assumption of care. Treated initially with NPPV 12/21 TTE:  EF 40 to 41%, grade 1 diastolic dysfx 32/44 Renal US:  No hydronephrosis 12/21 PAF with RVR. Amiodarone started. Converted to NSR. Cards consult obtained. Recommended continuing current rx. No anticoagulation given high risk 12/22 Increased dyspnea. White out of R hemithorax on CXR. Intubated. Copious purulent secretions from ETT.  12/22 CT chest:  extensive pleural thickening on R. R lung with reduced volume. 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 Bronchomalacia noted 12/29 Re-intubation  01/02 Family conference. Discussed options and pt wished to proceed with trach tube placement and likely LTACH 01/04 Hyper K+, brady, d/ced lopressor. Treated hyperK+. 01/05 Recurrent R lung collapse by CXR. Tracheostomy tube placed. 01/06 Increasing left lung interstitial opacities suspicious for edema, improved aeration RLL. No pnx. 01/06 Recurrent R lung collapse. Repeat bronchoscopy with removal of airway secretions. BAL specimen sent for cytology > atypical cells in background of extensive inflammation 01/07 Transferred to vent SDU bed  INDWELLING DEVICES:: ETT 12/22 >> 12/24, 12/29 >> 01/06 L IJ CVL 12/22 >>  Trach tube 01/06 >>   MICRO DATA: MRSA PCR 12/20 >> NEG Urine 12/20 >> NEG Resp 12/22 >> NOF Blood 12/20 >> NEG Resp 12/29 >> NOF C diff 01/04 >> NEG URine 01/06 >>   >100K multiple organisms Resp (BAL) 01/06 >>  Blood 01/06 >>   ANTIMICROBIALS:  Vanc 12/20 >> 12/22, 01/06 >>  Cefepime 12/20 >> 12/28, 01/06 >>     SUBJECTIVE/OVERNIGHT/INTERVAL HX RASS 0. C/O HA refractory to hydromorphone. Tolerating PS 12 cm H2O Awake, offers hand to shake. Denies pain. Hgb 6.0 this AM- transfusion pending from order from [facility], discussed w nurse  VITAL SIGNS: Temp:  [97.9 F (36.6 C)-98.8 F (37.1 C)] 98.5 F (36.9 C) (01/09 0406) Pulse Rate:  [65-83] 83 (01/09 0900) Resp:  [11-24] 20 (01/09 0900) BP: (105-137)/(46-112) 137/56 mmHg (01/09 0900) SpO2:  [96 %-100 %] 100 % (01/09 0900) FiO2 (%):  [40 %] 40 % (01/09 0821) INTAKE / OUTPUT:  Intake/Output Summary (Last 24 hours) at 01/19/14 1108 Last data filed at 01/19/14 0700  Gross per 24 hour  Intake   2920 ml  Output    600 ml  Net   2320 ml    PHYSICAL EXAMINATION: General: NAD on PSV Neuro :RASS 0. No focal deficits, weak HEENT: WNL Cardiovascular: reg, no M Lungs: diminished BS on R, no wheezes, trach clean Abdomen:  Obese, soft, +bs- active, suprapubic catheter Ext:  Warm, no edema, compression hose in place  LABS:  I have reviewed all of today's lab results. Relevant abnormalities are discussed in the A/P section  CXR: Recurrent collapse/white out of R lung  ASSESSMENT / PLAN:  PULMONARY A: Acute on chronic hypercarbic respiratory failure.  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 PAF this hospitalization. Deemed poor candidate for chronic anticoagulation P:  Cont ASA, statin Holding clopidogrel  Cont  amiodarone  RENAL A: Hypernatremia, resolved Hyperkalemia, recurrent AKI, nonoliguric CKD (baseline Cr 1/35)  BPH, prostate cancer P:   Monitor BMET intermittently Monitor I/Os Correct electrolytes as indicated Cont free water Holding Proscar, flomax, tasigna for now  GASTROINTESTINAL A: Hx of GERD Tracheostomy associated dysphagia P:   SUP: Famotidine Cont TFs per protocol G tube per IR next week  HEMATOLOGIC A: Anemia of chronic disease ICU associated anemia without overt blood loss  CML Thrombocytopenia, resolved Active BS- watch for GI blood loss. Transfusion pending for hgb 6.0, on SQ heparin  P:  DVT px: SQ heparin Monitor CBC intermittently Transfuse per usual ICU guidelines  INFECTIOUS A: Purulent tracheobronchitis Suspected HCAP, NOS Fever P:   Micro and abx as above DC vanc 01/08 if no MRSA Complete 7-10 days of antibiotics  ENDOCRINE A: DM 2, controlled Hx of gout P:   COnt SSI  NEUROLOGIC A: Acute metabolic encephalopathy, resolved Chronic pain Severe HA 01/08  Severe deconditioning P:   Cont PRN hydromorphone IV ibuprofen X 1 01/08 PT eval Will need LTACH  CD [place], [place] p [phone]    m [phone] call [phone]

## 2014-01-20 LAB — CBC WITH DIFFERENTIAL/PLATELET
Basophils Absolute: 0.1 10*3/uL (ref 0.0–0.1)
Basophils Relative: 0 % (ref 0–1)
EOS PCT: 2 % (ref 0–5)
Eosinophils Absolute: 0.3 10*3/uL (ref 0.0–0.7)
HCT: 28.3 % — ABNORMAL LOW (ref 39.0–52.0)
HEMOGLOBIN: 8.9 g/dL — AB (ref 13.0–17.0)
LYMPHS ABS: 1.5 10*3/uL (ref 0.7–4.0)
LYMPHS PCT: 9 % — AB (ref 12–46)
MCH: 28.2 pg (ref 26.0–34.0)
MCHC: 31.4 g/dL (ref 30.0–36.0)
MCV: 89.6 fL (ref 78.0–100.0)
MONOS PCT: 10 % (ref 3–12)
Monocytes Absolute: 1.5 10*3/uL — ABNORMAL HIGH (ref 0.1–1.0)
NEUTROS PCT: 79 % — AB (ref 43–77)
Neutro Abs: 12.4 10*3/uL — ABNORMAL HIGH (ref 1.7–7.7)
Platelets: 362 10*3/uL (ref 150–400)
RBC: 3.16 MIL/uL — ABNORMAL LOW (ref 4.22–5.81)
RDW: 16.2 % — AB (ref 11.5–15.5)
WBC: 15.7 10*3/uL — ABNORMAL HIGH (ref 4.0–10.5)

## 2014-01-20 LAB — TYPE AND SCREEN
ABO/RH(D): O POS
Antibody Screen: NEGATIVE
Unit division: 0

## 2014-01-20 LAB — CULTURE, BAL-QUANTITATIVE W GRAM STAIN: Colony Count: 50000

## 2014-01-20 LAB — BASIC METABOLIC PANEL
Anion gap: 10 (ref 5–15)
BUN: 44 mg/dL — ABNORMAL HIGH (ref 6–23)
CO2: 27 mmol/L (ref 19–32)
Calcium: 8.6 mg/dL (ref 8.4–10.5)
Chloride: 109 mEq/L (ref 96–112)
Creatinine, Ser: 1.45 mg/dL — ABNORMAL HIGH (ref 0.50–1.35)
GFR calc Af Amer: 54 mL/min — ABNORMAL LOW (ref >90)
GFR calc non Af Amer: 46 mL/min — ABNORMAL LOW (ref >90)
Glucose, Bld: 142 mg/dL — ABNORMAL HIGH (ref 70–99)
Potassium: 3.8 mmol/L (ref 3.5–5.1)
Sodium: 146 mmol/L — ABNORMAL HIGH (ref 135–145)

## 2014-01-20 LAB — GLUCOSE, CAPILLARY
GLUCOSE-CAPILLARY: 121 mg/dL — AB (ref 70–99)
Glucose-Capillary: 125 mg/dL — ABNORMAL HIGH (ref 70–99)
Glucose-Capillary: 121 mg/dL — ABNORMAL HIGH (ref 70–99)
Glucose-Capillary: 120 mg/dL — ABNORMAL HIGH (ref 70–99)
Glucose-Capillary: 119 mg/dL — ABNORMAL HIGH (ref 70–99)
Glucose-Capillary: 108 mg/dL — ABNORMAL HIGH (ref 70–99)

## 2014-01-20 LAB — CULTURE, BAL-QUANTITATIVE

## 2014-01-20 MED ORDER — CEFAZOLIN SODIUM 1-5 GM-% IV SOLN
1.0000 g | Freq: Three times a day (TID) | INTRAVENOUS | Status: DC
  Administered 2014-01-21 (×2): 1 g via INTRAVENOUS
  Filled 2014-01-20 (×4): qty 50

## 2014-01-20 MED ORDER — METOPROLOL TARTRATE 1 MG/ML IV SOLN
10.0000 mg | Freq: Three times a day (TID) | INTRAVENOUS | Status: AC | PRN
  Administered 2014-01-21: 10 mg via INTRAVENOUS
  Filled 2014-01-20: qty 10

## 2014-01-20 MED ORDER — SODIUM CHLORIDE 0.9 % IV SOLN
600.0000 mg | Freq: Once | INTRAVENOUS | Status: AC
  Administered 2014-01-20: 600 mg via INTRAVENOUS
  Filled 2014-01-20: qty 6

## 2014-01-20 NOTE — Progress Notes (Signed)
When checking for placement of dobhoff to give pt pain medication for headache, no air heard in stomach.  Re-checked and noticed that pt had frothy tan substance bubbling in mouth.  Tube removed, mouth suctioned.  Attempted to place another dobhoff, pt did not tolerate as he became distressed.  MD notified. Order for placement under floro ordered.

## 2014-01-20 NOTE — Progress Notes (Signed)
PULMONARY / CRITICAL CARE MEDICINE   Name: Dale Taylor MRN: [id number] DOB: 1940/02/26    ADMISSION DATE:  12/20/2013 CONSULTATION DATE:  12/20  REFERRING MD :  FPTS  CHIEF COMPLAINT:  Respiratory failure    INITIAL PRESENTATION:  74 yo male CRI, prostate ca admitted with A on C renal failure, hyperkalemia, HCAP, UTI.    STUDIES/EVENTS:  12/20 Admitted to [facility] wit dx of PNA 12/20 Transferred to ICU with respiratory acidosis, hypersomnolence. PCCM consultation and assumption of care. Treated initially with NPPV 12/21 TTE:  EF 40 to 83%, grade 1 diastolic dysfx 38/25 Renal US:  No hydronephrosis 12/21 PAF with RVR. Amiodarone started. Converted to NSR. Cards consult obtained. Recommended continuing current rx. No anticoagulation given high risk 12/22 Increased dyspnea. White out of R hemithorax on CXR. Intubated. Copious purulent secretions from ETT.  12/22 CT chest:  extensive pleural thickening on R. R lung with reduced volume. 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 Bronchomalacia noted 12/29 Re-intubation  01/02 Family conference. Discussed options and pt wished to proceed with trach tube placement and likely LTACH 01/04 Hyper K+, brady, d/ced lopressor. Treated hyperK+. 01/05 Recurrent R lung collapse by CXR. Tracheostomy tube placed. 01/06 Increasing left lung interstitial opacities suspicious for edema, improved aeration RLL. No pnx. 01/06 Recurrent R lung collapse. Repeat bronchoscopy with removal of airway secretions. BAL specimen sent for cytology > atypical cells in background of extensive inflammation 01/07 Transferred to vent SDU bed  INDWELLING DEVICES:: ETT 12/22 >> 12/24, 12/29 >> 01/06 L IJ CVL 12/22 >>  Trach tube 01/06 >>   MICRO DATA: MRSA PCR 12/20 >> NEG Urine 12/20 >> NEG Resp 12/22 >> NOF Blood 12/20 >> NEG Resp 12/29 >> NOF C diff 01/04 >> NEG URine 01/06 >>   >100K multiple organisms Resp (BAL) 01/06 >>  Blood 01/06 >>   ANTIMICROBIALS:  Vanc 12/20 >> 12/22, 01/06 >>  Cefepime 12/20 >> 12/28, 01/06 >>     SUBJECTIVE/OVERNIGHT/INTERVAL HX RASS 0. C/O HA responds temporarily to pain meds.Tolerating PS 12 cm H2O Awake, interactive.   VITAL SIGNS: Temp:  [98.3 F (36.8 C)-99.5 F (37.5 C)] 99.5 F (37.5 C) (01/10 0813) Pulse Rate:  [63-76] 74 (01/10 0600) Resp:  [14-24] 21 (01/10 0600) BP: (106-155)/(48-72) 155/62 mmHg (01/10 0813) SpO2:  [91 %-100 %] 100 % (01/10 0851) FiO2 (%):  [40 %] 40 % (01/10 0851) INTAKE / OUTPUT:  Intake/Output Summary (Last 24 hours) at 01/20/14 0956 Last data filed at 01/20/14 0816  Gross per 24 hour  Intake   1440 ml  Output   1450 ml  Net    -10 ml    PHYSICAL EXAMINATION: General: NAD on PSV Neuro :RASS 0. No focal deficits, weak HEENT: WNL Cardiovascular: reg, no M Lungs: diminished BS on R, no wheezes, trach clean Abdomen:  Obese, soft, +bs- active, suprapubic catheter Ext:  Warm, no edema, compression hose in place  LABS:  I have reviewed all of today's lab results. Relevant abnormalities are discussed in the A/P section  CXR: Recurrent collapse/white out of R lung  ASSESSMENT / PLAN:  PULMONARY A: Acute on chronic hypercarbic respiratory failure.  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 Bronch for mucus plugs? VEST?   CARDIOVASCULAR A: Hx of CAD, HTN, HLD, CHF, PVD. PAF this hospitalization. Deemed poor candidate for chronic anticoagulation P:  Cont ASA, statin Holding clopidogrel  Cont  amiodarone  RENAL A: Hypernatremia, resolved Hyperkalemia, recurrent AKI, nonoliguric CKD (baseline Cr 1/35)  BPH, prostate cancer P:   Monitor BMET intermittently Monitor I/Os Correct electrolytes as indicated Cont free water Holding Proscar, flomax, tasigna for now  GASTROINTESTINAL A: Hx of GERD Tracheostomy associated dysphagia P:   SUP: Famotidine Cont TFs per protocol G tube per IR next week Replace NG per fluoro guidance until PEG placed- discussed w nurse  HEMATOLOGIC A: Anemia of chronic disease ICU associated anemia without overt blood loss  CML Thrombocytopenia, resolved Active BS- watch for GI blood loss on SQ heparin, Hg up to 8.9 as of 1/10 after tf 1/11  P:  DVT px: SQ heparin Monitor CBC intermittently Transfuse per usual ICU guidelines  INFECTIOUS A: Purulent tracheobronchitis Suspected HCAP, NOS Fever P:   Micro and abx as above DC vanc 01/08 if no MRSA Complete 7-10 days of antibiotics  ENDOCRINE A: DM 2, controlled Hx of gout P:   COnt SSI  NEUROLOGIC A: Acute metabolic encephalopathy, resolved Chronic pain Chronic HA- seems to be occipital, radiating bilat to vertex. Severe deconditioning P:   Cont PRN hydromorphone IV ibuprofen X 1 01/08 PT eval Will need LTACH  CD Douglass, [place] p [phone]    m [phone] call [phone]

## 2014-01-20 NOTE — Progress Notes (Signed)
Pt HOB up.  Holding on cpt

## 2014-01-20 NOTE — Progress Notes (Signed)
eLink Physician-Brief Progress Note Patient Name: Dale Taylor DOB: December 16, 1940 MRN: [id number]   Date of Service  01/20/2014  HPI/Events of Note  Sputum with MSSA.  eICU Interventions  Will change Abx to ancef per pharmacy.     Intervention Category Major Interventions: Other:  Deidrick Rainey 01/20/2014, 5:07 PM

## 2014-01-21 ENCOUNTER — Inpatient Hospital Stay (HOSPITAL_COMMUNITY): Payer: Medicare HMO

## 2014-01-21 LAB — CBC WITH DIFFERENTIAL/PLATELET
Basophils Absolute: 0.1 10*3/uL (ref 0.0–0.1)
Basophils Relative: 1 % (ref 0–1)
EOS ABS: 0.2 10*3/uL (ref 0.0–0.7)
Eosinophils Relative: 1 % (ref 0–5)
HCT: 25.9 % — ABNORMAL LOW (ref 39.0–52.0)
HEMOGLOBIN: 8.3 g/dL — AB (ref 13.0–17.0)
LYMPHS ABS: 1.1 10*3/uL (ref 0.7–4.0)
LYMPHS PCT: 9 % — AB (ref 12–46)
MCH: 28.4 pg (ref 26.0–34.0)
MCHC: 32 g/dL (ref 30.0–36.0)
MCV: 88.7 fL (ref 78.0–100.0)
Monocytes Absolute: 1.2 10*3/uL — ABNORMAL HIGH (ref 0.1–1.0)
Monocytes Relative: 10 % (ref 3–12)
Neutro Abs: 10.1 10*3/uL — ABNORMAL HIGH (ref 1.7–7.7)
Neutrophils Relative %: 79 % — ABNORMAL HIGH (ref 43–77)
PLATELETS: 355 10*3/uL (ref 150–400)
RBC: 2.92 MIL/uL — ABNORMAL LOW (ref 4.22–5.81)
RDW: 16 % — ABNORMAL HIGH (ref 11.5–15.5)
WBC: 12.6 10*3/uL — AB (ref 4.0–10.5)

## 2014-01-21 LAB — GLUCOSE, CAPILLARY
GLUCOSE-CAPILLARY: 108 mg/dL — AB (ref 70–99)
GLUCOSE-CAPILLARY: 93 mg/dL (ref 70–99)
GLUCOSE-CAPILLARY: 97 mg/dL (ref 70–99)
Glucose-Capillary: 114 mg/dL — ABNORMAL HIGH (ref 70–99)
Glucose-Capillary: 108 mg/dL — ABNORMAL HIGH (ref 70–99)

## 2014-01-21 MED ORDER — BACITRACIN-NEOMYCIN-POLYMYXIN OINTMENT TUBE
1.0000 "application " | TOPICAL_OINTMENT | Freq: Every day | CUTANEOUS | Status: AC
  Administered 2014-01-21 – 2014-01-27 (×7): 1 via TOPICAL
  Filled 2014-01-21 (×2): qty 1

## 2014-01-21 MED ORDER — FENTANYL CITRATE 0.05 MG/ML IJ SOLN
INTRAMUSCULAR | Status: AC | PRN
  Administered 2014-01-21 (×2): 50 ug via INTRAVENOUS

## 2014-01-21 MED ORDER — MIDAZOLAM HCL 2 MG/2ML IJ SOLN
INTRAMUSCULAR | Status: AC
  Filled 2014-01-21: qty 4

## 2014-01-21 MED ORDER — FREE WATER
200.0000 mL | Freq: Four times a day (QID) | Status: DC
  Administered 2014-01-22 – 2014-01-26 (×15): 200 mL

## 2014-01-21 MED ORDER — LIDOCAINE HCL 1 % IJ SOLN
INTRAMUSCULAR | Status: AC
  Filled 2014-01-21: qty 20

## 2014-01-21 MED ORDER — VITAL AF 1.2 CAL PO LIQD
1000.0000 mL | ORAL | Status: DC
  Administered 2014-01-22 – 2014-02-03 (×16): 1000 mL
  Filled 2014-01-21 (×26): qty 1000

## 2014-01-21 MED ORDER — MIDAZOLAM HCL 2 MG/2ML IJ SOLN
INTRAMUSCULAR | Status: AC | PRN
  Administered 2014-01-21 (×2): 1 mg via INTRAVENOUS

## 2014-01-21 MED ORDER — CEFAZOLIN SODIUM-DEXTROSE 2-3 GM-% IV SOLR
2.0000 g | Freq: Three times a day (TID) | INTRAVENOUS | Status: AC
  Administered 2014-01-21 – 2014-01-25 (×12): 2 g via INTRAVENOUS
  Filled 2014-01-21 (×15): qty 50

## 2014-01-21 MED ORDER — FENTANYL CITRATE 0.05 MG/ML IJ SOLN
INTRAMUSCULAR | Status: DC
Start: 2014-01-21 — End: 2014-01-22
  Filled 2014-01-21: qty 4

## 2014-01-21 MED ORDER — IOHEXOL 300 MG/ML  SOLN
50.0000 mL | Freq: Once | INTRAMUSCULAR | Status: AC | PRN
  Administered 2014-01-21: 15 mL

## 2014-01-21 NOTE — Procedures (Signed)
Interventional Radiology Procedure Note  Procedure: Placement of percutaneous 20F pull-through gastrostomy tube. Complications: None Recommendations: - NPO except for sips and chips remainder of today and overnight - Maintain G-tube to LWS until tomorrow morning  - May advance diet as tolerated and begin using tube tomorrow morning  Signed,   Baird Polinski S. Maika Kaczmarek, DO   

## 2014-01-21 NOTE — Progress Notes (Signed)
PULMONARY / CRITICAL CARE MEDICINE   Name: Dale Taylor MRN: [id number] DOB: 01-14-1940    ADMISSION DATE:  12/18/2013 CONSULTATION DATE:  12/20  REFERRING MD :  FPTS  CHIEF COMPLAINT:  Respiratory failure    INITIAL PRESENTATION:  74 yo male CRI, prostate ca admitted with A on C renal failure, hyperkalemia, HCAP, UTI.    STUDIES/EVENTS:  12/20 Admitted to [facility] wit dx of PNA 12/20 Transferred to ICU with respiratory acidosis, hypersomnolence. PCCM consultation and assumption of care. Treated initially with NPPV 12/21 TTE:  EF 40 to 62%, grade 1 diastolic dysfx 70/35 Renal US:  No hydronephrosis 12/21 PAF with RVR. Amiodarone started. Converted to NSR. Cards consult obtained. Recommended continuing current rx. No anticoagulation given high risk 12/22 Increased dyspnea. White out of R hemithorax on CXR. Intubated. Copious purulent secretions from ETT.  12/22 CT chest:  extensive pleural thickening on R. R lung with reduced volume. 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 Bronchomalacia noted 12/29 Re-intubation  01/02 Family conference. Discussed options and pt wished to proceed with trach tube placement and likely LTACH 01/04 Hyper K+, brady, d/ced lopressor. Treated hyperK+. 01/05 Recurrent R lung collapse by CXR. Tracheostomy tube placed. 01/06 Increasing left lung interstitial opacities suspicious for edema, improved aeration RLL. No pnx. 01/06 Recurrent R lung collapse. Repeat bronchoscopy with removal of airway secretions. BAL specimen sent for cytology > atypical cells in background of extensive inflammation 01/07 Transferred to vent SDU bed  INDWELLING DEVICES:: ETT 12/22 >> 12/24, 12/29 >> 01/06 L IJ CVL 12/22 >>  Trach tube 01/06 >>   MICRO DATA: MRSA PCR 12/20 >> NEG Urine 12/20 >> NEG Resp 12/22 >> NOF Blood 12/20 >> NEG Resp 12/29 >> NOF C diff 01/04 >> NEG URine 01/06 >>   >100K multiple organisms Resp (BAL) 01/06 >> MSSA Blood 01/06 >>   ANTIMICROBIALS:  Vanc 12/20 >> 12/22, 01/06 >> 1/10 Cefepime 12/20 >> 12/28, 01/06 >> 1/10    SUBJECTIVE/OVERNIGHT/INTERVAL HX Appears comfortable   VITAL SIGNS: Temp:  [98.1 F (36.7 C)-99.6 F (37.6 C)] 98.4 F (36.9 C) (01/11 1140) Pulse Rate:  [62-86] 71 (01/11 1140) Resp:  [16-26] 24 (01/11 1201) BP: (124-151)/(49-62) 151/62 mmHg (01/11 1140) SpO2:  [97 %-100 %] 100 % (01/11 1201) FiO2 (%):  [40 %] 40 % (01/11 1201) Weight:  [82.6 kg (182 lb 1.6 oz)] 82.6 kg (182 lb 1.6 oz) (01/11 0451)  Vent Mode:  [-] PSV;CPAP FiO2 (%):  [40 %] 40 % Set Rate:  [24 bmp] 24 bmp Vt Set:  [600 mL] 600 mL PEEP:  [5 cmH20] 5 cmH20 Pressure Support:  [10 cmH20] 10 cmH20 Plateau Pressure:  [15 cmH20-25 cmH20] 25 cmH20INTAKE / OUTPUT:  Intake/Output Summary (Last 24 hours) at 01/21/14 1254 Last data filed at 01/21/14 1144  Gross per 24 hour  Intake    510 ml  Output   1350 ml  Net   -840 ml    PHYSICAL EXAMINATION: General: NAD on PSV Neuro :RASS 0. No focal deficits, weak HEENT: WNL Cardiovascular: reg, no M Lungs: diminished BS on R, no wheezes, trach clean Abdomen:  Obese, soft, +bs- active, suprapubic catheter Ext:  Warm, no edema, compression hose in place  LABS:  Recent Labs Lab 01/18/14 0600 01/19/14 0515 01/20/14 0500  NA 140 142 146*  K 3.8 3.9 3.8  CL 106 110 109  CO2 26 27 27   BUN 43* 45* 44*  CREATININE 1.58* 1.44* 1.45*  GLUCOSE 179* 193* 142*    Recent Labs Lab 01/19/14 1614 01/20/14 0500 01/21/14 0524  HGB 8.4* 8.9* 8.3*  HCT 26.7* 28.3* 25.9*  WBC 15.8* 15.7* 12.6*  PLT 326 362 355     CXR: Recurrent collapse/white out of R lung  ASSESSMENT / PLAN:   Acute on chronic hypercarbic respiratory failure, in setting of HCAP and purulent bronchitis (MSSA)  Probable cavitary PNA by CT scan of abd on 1/7 Chronic R pleural dz, unclear etiology Recurrent R lung collapse- near  complete as of 1/10.  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   Hx of CAD, HTN, HLD, CHF, PVD. PAF this hospitalization. Deemed poor candidate for chronic anticoagulation Plan:  Cont ASA, statin Holding clopidogrel  Cont amiodarone   Acute on chronic renal failure (baseline scr 1.35) Mild Hypernatremia  BPH, prostate cancer Plan:   Monitor BMET intermittently Monitor I/Os Correct electrolytes as indicated Cont free water Holding Proscar, flomax, tasigna for now  Tracheostomy associated dysphagia Hx of GERD Plan:   SUP: Famotidine Cont TFs per protocol G tube per IR this week Replace NG per fluoro guidance until PEG placed- discussed w nurse  Acute metabolic encephalopathy, resolved Chronic pain Chronic HA- seems to be occipital, radiating bilat to vertex. Severe deconditioning P:   Cont PRN hydromorphone IV ibuprofen X 1 01/08 PT eval Will need LTACH  ICU associated anemia without overt blood loss; superimposed on Anemia of chronic disease CML Plan:  DVT px: SQ heparin Monitor CBC intermittently   DM 2, controlled Hx of gout P:   COnt SSI   Erick Colace ACNP-BC [place] Pager # [phone] OR # [phone] if no answer  Increase PEEP to 8, no need for bronch at this time.  May need to be addressed at a later time.  Continue abx for a total of 14 days.  Patient seen and examined, agree with above note.  I dictated the care and orders written for this patient under my direction.  Rush Farmer, MD [phone]

## 2014-01-21 NOTE — Progress Notes (Signed)
Spoke with E-Link Nurse about pt NPO status and holding PO medications. E-Link Nurse informed Dr. Joya Gaskins that the pt has not received PO meds for two nights because of NG displacement and PEG tube placement today. Dr. Joya Gaskins said it is ok to hold PO meds for tonight but will need to resume medication tomorrow.   Alessandra Grout, RN

## 2014-01-21 NOTE — Progress Notes (Signed)
Physical Therapy Treatment Patient Details Name: Dale Taylor MRN: [id number] DOB: 10/23/1940 Today's Date: 02-13-14    History of Present Illness Dale Taylor is a 74 y.o. male presenting with acute renal failure according to patient's nursing facility. PMH is significant for hypertension, Type 2 diabetes, chronic diastolic heart failure, hyperkalemia, HLD, prostate cancer, iron deficiency anemia, A. fib with RVR. Intubated 12/22, Extubated 12/24. Re-intubated 12/29.    PT Comments    Patient seen for therapy session focused on strengthening for BLEs.  Patient tolerated multiple there-ex well with active participation and resistance. Did not perform OOB mobility at this time as patient was to head to procedure. Will continue to see and progress as tolerated.  Follow Up Recommendations  LTACH     Equipment Recommendations  None recommended by PT    Recommendations for Other Services       Precautions / Restrictions Precautions Precautions: Fall Precaution Comments: vent, panda Restrictions Weight Bearing Restrictions: No    Mobility  Bed Mobility                  Transfers                    Ambulation/Gait                 Stairs            Wheelchair Mobility    Modified Rankin (Stroke Patients Only)       Balance                                    Cognition Arousal/Alertness: Awake/alert Behavior During Therapy: Flat affect Overall Cognitive Status: Difficult to assess Area of Impairment: Following commands     Memory: Decreased short-term memory Following Commands: Follows one step commands consistently Safety/Judgement: Decreased awareness of safety;Decreased awareness of deficits          Exercises General Exercises - Lower Extremity Ankle Circles/Pumps: AROM;Both;15 reps;Supine (5 against resistance) Quad Sets: Strengthening;Both;10 reps;Supine Heel Slides: AAROM;Strengthening;Both;10 reps;Supine Hip  ABduction/ADduction: Strengthening;Both;10 reps;Supine (pillow squeeze) Straight Leg Raises: AAROM;Strengthening;Both;10 reps;Supine Hip Flexion/Marching: PROM;Strengthening;Both;5 reps;Supine    General Comments        Pertinent Vitals/Pain      Home Living                      Prior Function            PT Goals (current goals can now be found in the care plan section) Acute Rehab PT Goals Patient Stated Goal: be able to walk PT Goal Formulation: With patient Time For Goal Achievement: 01/28/14 Potential to Achieve Goals: Fair Progress towards PT goals: Progressing toward goals    Frequency  Min 2X/week    PT Plan Discharge plan needs to be updated    Co-evaluation             End of Session Equipment Utilized During Treatment: Other (comment) (vent) Activity Tolerance: Patient limited by fatigue;Other (comment) (heading OTF for procedure) Patient left: in bed;Other (comment) (with transport for procedure)     Time: 1423-1440 PT Time Calculation (min) (ACUTE ONLY): 17 min  Charges:  $Therapeutic Exercise: 8-22 mins                    G CodesDuncan Dull 2014/02/13, 2:55 PM Alben Deeds, PT DPT  [phone]   

## 2014-01-21 NOTE — Progress Notes (Signed)
Pt transported on vent to IR and back to [facility] no complications noted. Pt vent tubing was changed due to secretions in tubing. Pt tolerated well. RT will continue to monitor.

## 2014-01-21 NOTE — Progress Notes (Signed)
NUTRITION FOLLOW UP  Intervention:   Discontinue Vital High Protein.  Once NGT is placed and ready for use, initate TF with Vital AF 1.2 at 35 ml and increase by 10 ml every 4 hours to goal rate of 65 ml/h to provide 1872 kcals, 117 gm protein, 1265 ml free water daily.  Continue free water flushes.  Nutrition Dx:   Inadequate oral intake related to inability to eat as evidenced by NPO status, ongoing.  Goal:   Intake to meet >90% of estimated nutrition needs. Not met  Monitor:   TF tolerance/adequacy, weight trend, labs, respiratory status.  Assessment:   74yo male with extensive PMH including hx CML, prostate ca, HTN, chronic dCHF, chronic suprapubic catheter, DM and CKD presented 12/20 from SNF with concern for "kidney problems". Admitted by [facility] teaching service with acute on chronic renal failure with hyperkalemia, HCAP +/- worsening R effusion and UTI. PCCM consulted r/t respiratory acidosis and increasing somnolence.   Tracheostomy 1/5.  Pt has been receiving TF of Vital High Protein at 65 ml/h (1560 ml per day) to provide 1560 kcals, 137 gm protein, 1310 ml free water daily. NGT removed 1/10 as pt with frothy tan substance bubbling in mouth. Plans for replacement of NG tube under floro ordered. RD will discontinue Vital high protein and order Vital AF 1.2 at goal rate of 65 as to better fit needs.   Patient is currently intubated on ventilator support MV: 10.1, 9.5, 10 L/min Temp (24hrs), Avg:98.5 F (36.9 C), Min:98.1 F (36.7 C), Max:99.6 F (37.6 C)  Propofol: none  Height: Ht Readings from Last 1 Encounters:  01/08/14 '5\' 11"'  (1.803 m)    Weight Status:   Wt Readings from Last 1 Encounters:  01/21/14 182 lb 1.6 oz (82.6 kg)   01/08/14 211 lb 6.7 oz (95.9 kg)   01/02/14 223 lb 12.3 oz (101.5 kg)  *weight trending down*  Body mass index is 25.41 kg/(m^2).  Re-estimated needs:  Kcal: 1850 Protein: 115-125 gm Fluid: 2 L  Skin: skin tear to buttocks, +1  generalized edema  Diet Order: Diet NPO time specified Except for: Sips with Meds   Intake/Output Summary (Last 24 hours) at 01/21/14 1350 Last data filed at 01/21/14 1144  Gross per 24 hour  Intake    510 ml  Output   1350 ml  Net   -840 ml    Last BM: 1/10   Labs:   Recent Labs Lab 01/15/14 0445  01/18/14 0600 01/19/14 0515 01/20/14 0500  NA 149*  < > 140 142 146*  K 4.9  < > 3.8 3.9 3.8  CL 111  < > 106 110 109  CO2 31  < > '26 27 27  ' BUN 49*  < > 43* 45* 44*  CREATININE 1.51*  < > 1.58* 1.44* 1.45*  CALCIUM 8.4  < > 8.0* 8.1* 8.6  MG 2.0  --   --   --   --   PHOS 4.0  --   --   --   --   GLUCOSE 190*  < > 179* 193* 142*  < > = values in this interval not displayed.  CBG (last 3)   Recent Labs  01/21/14 0453 01/21/14 0835 01/21/14 1141  GLUCAP 114* 93 108*    Scheduled Meds: . sodium chloride   Intravenous Once  . albuterol  2.5 mg Nebulization Q6H  . amiodarone  400 mg Oral Q12H   Followed by  . [START ON 01/25/2014]  amiodarone  400 mg Oral Daily  . antiseptic oral rinse  7 mL Mouth Rinse QID  . aspirin  81 mg Per Tube Daily  . atorvastatin  20 mg Per Tube q1800  .  ceFAZolin (ANCEF) IV  1 g Intravenous 3 times per day  .  ceFAZolin (ANCEF) IV  2 g Intravenous On Call  . chlorhexidine  15 mL Mouth Rinse BID  . famotidine  20 mg Per Tube BID  . feeding supplement (VITAL HIGH PROTEIN)  1,000 mL Per Tube Q24H  . free water  200 mL Per Tube 3 times per day  . insulin aspart  0-15 Units Subcutaneous 6 times per day  . metoprolol  25 mg Per Tube BID    Continuous Infusions:     Kallie Locks, MS, RD, LDN Pager # [phone] After hours/ weekend pager # [phone]

## 2014-01-22 ENCOUNTER — Inpatient Hospital Stay (HOSPITAL_COMMUNITY): Payer: Medicare HMO

## 2014-01-22 LAB — BASIC METABOLIC PANEL
ANION GAP: 6 (ref 5–15)
BUN: 33 mg/dL — ABNORMAL HIGH (ref 6–23)
CO2: 27 mmol/L (ref 19–32)
CREATININE: 1.3 mg/dL (ref 0.50–1.35)
Calcium: 8.7 mg/dL (ref 8.4–10.5)
Chloride: 114 mEq/L — ABNORMAL HIGH (ref 96–112)
GFR calc non Af Amer: 53 mL/min — ABNORMAL LOW (ref >90)
GFR, EST AFRICAN AMERICAN: 61 mL/min — AB (ref >90)
Glucose, Bld: 110 mg/dL — ABNORMAL HIGH (ref 70–99)
Potassium: 4.5 mmol/L (ref 3.5–5.1)
Sodium: 147 mmol/L — ABNORMAL HIGH (ref 135–145)

## 2014-01-22 LAB — GLUCOSE, CAPILLARY
GLUCOSE-CAPILLARY: 92 mg/dL (ref 70–99)
GLUCOSE-CAPILLARY: 97 mg/dL (ref 70–99)
Glucose-Capillary: 101 mg/dL — ABNORMAL HIGH (ref 70–99)
Glucose-Capillary: 106 mg/dL — ABNORMAL HIGH (ref 70–99)
Glucose-Capillary: 109 mg/dL — ABNORMAL HIGH (ref 70–99)
Glucose-Capillary: 167 mg/dL — ABNORMAL HIGH (ref 70–99)
Glucose-Capillary: 99 mg/dL (ref 70–99)

## 2014-01-22 LAB — CBC
HCT: 29.7 % — ABNORMAL LOW (ref 39.0–52.0)
Hemoglobin: 9.1 g/dL — ABNORMAL LOW (ref 13.0–17.0)
MCH: 27.8 pg (ref 26.0–34.0)
MCHC: 30.6 g/dL (ref 30.0–36.0)
MCV: 90.8 fL (ref 78.0–100.0)
PLATELETS: 386 10*3/uL (ref 150–400)
RBC: 3.27 MIL/uL — ABNORMAL LOW (ref 4.22–5.81)
RDW: 16 % — ABNORMAL HIGH (ref 11.5–15.5)
WBC: 16 10*3/uL — ABNORMAL HIGH (ref 4.0–10.5)

## 2014-01-22 MED ORDER — FENTANYL CITRATE 0.05 MG/ML IJ SOLN
50.0000 ug | Freq: Once | INTRAMUSCULAR | Status: AC
  Administered 2014-01-22: 50 ug via INTRAVENOUS

## 2014-01-22 MED ORDER — OXYCODONE HCL 5 MG/5ML PO SOLN
5.0000 mg | ORAL | Status: DC | PRN
Start: 2014-01-22 — End: 2014-01-23
  Administered 2014-01-23 (×3): 5 mg via ORAL
  Filled 2014-01-22 (×3): qty 5

## 2014-01-22 MED ORDER — TRAZODONE HCL 50 MG PO TABS
50.0000 mg | ORAL_TABLET | Freq: Every evening | ORAL | Status: DC | PRN
  Administered 2014-01-23 – 2014-02-01 (×5): 50 mg via ORAL
  Filled 2014-01-22 (×8): qty 1

## 2014-01-22 MED ORDER — FENTANYL CITRATE 0.05 MG/ML IJ SOLN
25.0000 ug | INTRAMUSCULAR | Status: DC | PRN
  Administered 2014-01-22 (×10): 50 ug via INTRAVENOUS
  Filled 2014-01-22 (×11): qty 2

## 2014-01-22 NOTE — Progress Notes (Signed)
Patient ID: Dale Taylor, male   DOB: [birth year], 74 y.o.   MRN: [id number]   Percutaneous gastric tube placed 01/21/14 in IR Pt resting  Site clean and dry Minimal tenderness No bleeding; no hematoma afeb +BS  May dc LWS and use G tube now

## 2014-01-22 NOTE — Progress Notes (Signed)
eLink Physician-Brief Progress Note Patient Name: Dale Taylor DOB: 10-15-1940 MRN: [id number]   Date of Service  01/22/2014  HPI/Events of Note  Headache, not responding to APAP/fioricet insomnia  eICU Interventions  Oxycodone prn Trazodone prn     Intervention Category Intermediate Interventions: Pain - evaluation and management Minor Interventions: Routine modifications to care plan (e.g. PRN medications for pain, fever)  MCQUAID, DOUGLAS 01/22/2014, 11:33 PM

## 2014-01-22 NOTE — Progress Notes (Signed)
01/22/2014-Respiratory care note- No wean this shift due to high peep and increased resp rate with minute ventilation >15 lpm.

## 2014-01-22 NOTE — Progress Notes (Signed)
PULMONARY / CRITICAL CARE MEDICINE   Name: Dale Taylor MRN: [id number] DOB: 1940-05-09    ADMISSION DATE:  12/19/2013 CONSULTATION DATE:  12/20  REFERRING MD :  FPTS  CHIEF COMPLAINT:  Respiratory failure    INITIAL PRESENTATION:  74 yo male CRI, prostate ca admitted with A on C renal failure, hyperkalemia, HCAP, UTI.    STUDIES/EVENTS:  12/20 Admitted to [facility] wit dx of PNA 12/20 Transferred to ICU with respiratory acidosis, hypersomnolence. PCCM consultation and assumption of care. Treated initially with NPPV 12/21 TTE:  EF 40 to 13%, grade 1 diastolic dysfx 24/40 Renal US:  No hydronephrosis 12/21 PAF with RVR. Amiodarone started. Converted to NSR. Cards consult obtained. Recommended continuing current rx. No anticoagulation given high risk 12/22 Increased dyspnea. White out of R hemithorax on CXR. Intubated. Copious purulent secretions from ETT.  12/22 CT chest:  extensive pleural thickening on R. R lung with reduced volume. Bilateral R>L AS dz 12/24 Extubated 12/25 recurrent mucus plugging 12/27 Bronchoscopy for removal of airway secretions (Jahmiyah Dullea). Bronchomalacia noted 12/29 Re-intubation  01/02 Family conference. Discussed options and pt wished to proceed with trach tube placement and likely LTACH 01/04 Hyper K+, brady, d/ced lopressor. Treated hyperK+. 01/05 Recurrent R lung collapse by CXR. Tracheostomy tube placed. 01/06 Increasing left lung interstitial opacities suspicious for edema, improved aeration RLL. No pnx. 01/06 Recurrent R lung collapse. Repeat bronchoscopy with removal of airway secretions. BAL specimen sent for cytology > atypical cells in background of extensive inflammation 01/07 Transferred to vent SDU bed  INDWELLING DEVICES:: ETT 12/22 >> 12/24, 12/29 >> 01/06 L IJ CVL 12/22 >>  Trach tube 01/06 >>   MICRO DATA: MRSA PCR 12/20 >> NEG Urine 12/20 >> NEG Resp 12/22 >> NOF Blood 12/20 >> NEG Resp 12/29 >> NOF C diff 01/04 >> NEG URine 01/06 >>   >100K multiple organisms Resp (BAL) 01/06 >> MSSA Blood 01/06 >>   ANTIMICROBIALS:  Vanc 12/20 >> 12/22, 01/06 >> 1/10 Cefepime 12/20 >> 12/28, 01/06 >> 1/10 Ancef 1/11 >>   SUBJECTIVE/OVERNIGHT/INTERVAL HX Appears comfortable   VITAL SIGNS: Temp:  [98 F (36.7 C)-98.7 F (37.1 C)] 98.3 F (36.8 C) (01/12 0420) Pulse Rate:  [68-90] 89 (01/12 0749) Resp:  [13-28] 28 (01/12 0749) BP: (110-162)/(62-95) 152/66 mmHg (01/12 0420) SpO2:  [93 %-100 %] 95 % (01/12 0749) FiO2 (%):  [40 %] 40 % (01/12 0749) Weight:  [83.7 kg (184 lb 8.4 oz)] 83.7 kg (184 lb 8.4 oz) (01/12 0420)  Vent Mode:  [-] PRVC FiO2 (%):  [40 %] 40 % Set Rate:  [14 bmp-24 bmp] 14 bmp Vt Set:  [600 mL] 600 mL PEEP:  [5 cmH20-8 cmH20] 8 cmH20 Pressure Support:  [10 cmH20] 10 cmH20 Plateau Pressure:  [24 cmH20-32 cmH20] 32 cmH20INTAKE / OUTPUT:  Intake/Output Summary (Last 24 hours) at 01/22/14 0834 Last data filed at 01/22/14 0600  Gross per 24 hour  Intake    270 ml  Output   1175 ml  Net   -905 ml    PHYSICAL EXAMINATION: General: NAD on PSV Neuro :RASS 0. No focal deficits, weak HEENT: WNL, trach unremarkable Cardiovascular: reg, no M Lungs: diminished BS on R, no wheezes, trach clean Abdomen:  Obese, soft, +bs- active, suprapubic catheter, PEG good Ext:  Warm, no edema, compression hose in place  LABS:  Recent Labs Lab 01/19/14 0515 01/20/14 0500 01/22/14 0545  NA 142 146* 147*  K 3.9 3.8 4.5  CL 110 109 114*  CO2 27  27 27  BUN 45* 44* 33*  CREATININE 1.44* 1.45* 1.30  GLUCOSE 193* 142* 110*    Recent Labs Lab 01/20/14 0500 01/21/14 0524 01/22/14 0630  HGB 8.9* 8.3* 9.1*  HCT 28.3* 25.9* 29.7*  WBC 15.7* 12.6* 16.0*  PLT 362 355 386     CXR: persistent/Recurrent collapse/white out of R lung-->a little improved aeration on 1/12  ASSESSMENT / PLAN:   Acute on chronic hypercarbic respiratory failure, in setting of HCAP and purulent bronchitis (MSSA)  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 Plan:   Cont vent support - Wean in PSV, hold PEEP @ 8 Cont vent bundle Cont nebulized BDs  Cont chest percussion Have spoken to SLP. We will look at In-line PMV trials towards end of this week, early next Changed to ancef for MSSA PNA on day 2/10, will complete 10d total   Hx of CAD, HTN, HLD, CHF, PVD. PAF this hospitalization. Deemed poor candidate for chronic anticoagulation Plan:  Cont ASA, statin Holding clopidogrel  Cont amiodarone   Acute on chronic renal failure (baseline scr 1.35) Mild Hypernatremia -->a little worse after holding free water for PEG placement  BPH, prostate cancer Plan:   Monitor BMET intermittently Monitor I/Os Correct electrolytes as indicated resume free water Holding Proscar, flomax, tasigna for now  Tracheostomy associated dysphagia Hx of GERD S/p PEG 1/11 Plan:   SUP: Famotidine Resume Tubefeeds.   Acute metabolic encephalopathy, resolved Chronic pain Chronic HA- seems to be occipital, radiating bilat to vertex. Severe deconditioning P:   Cont PRN hydromorphone PT eval Will need LTACH  ICU associated anemia without overt blood loss; superimposed on Anemia of chronic disease CML Plan:  DVT px: SQ heparin Monitor CBC intermittently   DM 2, controlled Hx of gout P:   COnt SSI   Erick Colace ACNP-BC [place] Pager # [phone] OR # [phone] if no answer   Erick Colace, NP [phone]  Reviewed above, examined.  He wants to eat.  Denies chest pain.  Decreased bs RT > LT.  Will continue pressure support weaning as tolerated.  Hope is that he can come of vent during the day.  He will likely need support during  sleep at least >> currently with trach and vent, but possibly transition to BiPAP at some point as his strength improves.  Fever curve better.  Continue Ancef for MSSA .  Chesley Mires, MD [facility] Pulmonary/Critical Care 01/22/2014, 11:05 AM Pager:  [phone] After 3pm call: [phone]

## 2014-01-23 DIAGNOSIS — Z930 Tracheostomy status: Secondary | ICD-10-CM

## 2014-01-23 LAB — GLUCOSE, CAPILLARY
GLUCOSE-CAPILLARY: 177 mg/dL — AB (ref 70–99)
GLUCOSE-CAPILLARY: 167 mg/dL — AB (ref 70–99)
Glucose-Capillary: 150 mg/dL — ABNORMAL HIGH (ref 70–99)
Glucose-Capillary: 200 mg/dL — ABNORMAL HIGH (ref 70–99)
Glucose-Capillary: 119 mg/dL — ABNORMAL HIGH (ref 70–99)
Glucose-Capillary: 154 mg/dL — ABNORMAL HIGH (ref 70–99)

## 2014-01-23 LAB — CULTURE, BLOOD (ROUTINE X 2)
Culture: NO GROWTH
Culture: NO GROWTH

## 2014-01-23 LAB — BASIC METABOLIC PANEL
Anion gap: 5 (ref 5–15)
BUN: 36 mg/dL — AB (ref 6–23)
CO2: 29 mmol/L (ref 19–32)
CREATININE: 1.27 mg/dL (ref 0.50–1.35)
Calcium: 8.7 mg/dL (ref 8.4–10.5)
Chloride: 111 mEq/L (ref 96–112)
GFR, EST AFRICAN AMERICAN: 63 mL/min — AB (ref >90)
GFR, EST NON AFRICAN AMERICAN: 54 mL/min — AB (ref >90)
Glucose, Bld: 221 mg/dL — ABNORMAL HIGH (ref 70–99)
Potassium: 4.2 mmol/L (ref 3.5–5.1)
Sodium: 145 mmol/L (ref 135–145)

## 2014-01-23 MED ORDER — METOPROLOL TARTRATE 1 MG/ML IV SOLN
5.0000 mg | Freq: Once | INTRAVENOUS | Status: AC
  Administered 2014-01-23: 5 mg via INTRAVENOUS

## 2014-01-23 MED ORDER — HYDROMORPHONE HCL 2 MG PO TABS
1.0000 mg | ORAL_TABLET | Freq: Four times a day (QID) | ORAL | Status: DC | PRN
  Administered 2014-01-23 – 2014-01-24 (×4): 1 mg via ORAL
  Filled 2014-01-23 (×4): qty 1

## 2014-01-23 MED ORDER — HYDROMORPHONE HCL 1 MG/ML PO LIQD: 0.5000 mg | ORAL | Status: DC | PRN

## 2014-01-23 NOTE — Progress Notes (Addendum)
01/23/2014 Patient heart rate at 1040 was 133 to 140's and breathing a little heavy.  Patient was given his schedule lopressor 25 mg and Amiodarone 400 mg, but heart rate continue to sustain in the 140's.  Critical care was paged and Dr Nelda Marseille gave order for Lopressor IV 5 mg times one. Lopressor was administered and heart rate decrease in the 80's. Continue to monitor patient. [facility] RN.

## 2014-01-23 NOTE — Progress Notes (Signed)
01/23/2014-1410-Respiratory care note- Sutures removed as per order received from [facility].  No complications noted.  Pt tolerated well. No visible skin breakdown noted at time of removal.  Skin, however, moist at site.  RN made aware.

## 2014-01-23 NOTE — Progress Notes (Addendum)
Physical Therapy Treatment Patient Details Name: Dale Taylor MRN: [id number] DOB: March 08, 1940 Today's Date: 01/23/2014    History of Present Illness Dale Taylor is a 74 y.o. male presenting with acute renal failure according to patient's nursing facility. PMH is significant for hypertension, Type 2 diabetes, chronic diastolic heart failure, hyperkalemia, HLD, prostate cancer, iron deficiency anemia, A. fib with RVR. Intubated 12/22, Extubated 12/24. Re-intubated 12/29.    PT Comments    Patient seen for therapies this session. Tolerated EOB with +2 max/total assist >10 minutes.  Attempted dynamic trunk control activities with poor ability to facilitate core.  Patient limited by significant fatigue with upright positioning, RT assisted with deep suctioning in upright position x2.  Patient HR elevated to SVT, HR 120s.  Resp controlled with ventilator support and increased Breath support engaged for 2 minutes. Patient motivated despite fatigue. Will continue to see and progress as tolerated.    Follow Up Recommendations  SNF     Equipment Recommendations  None recommended by PT    Recommendations for Other Services       Precautions / Restrictions Precautions Precautions: Fall Precaution Comments: vent, panda Restrictions Weight Bearing Restrictions: No    Mobility  Bed Mobility Overal bed mobility: Needs Assistance;+2 for physical assistance Bed Mobility: Supine to Sit;Sit to Supine Rolling: Max assist;Total assist;+2 for physical assistance   Supine to sit: Max assist;+2 for physical assistance Sit to supine: Total assist;+2 for physical assistance   General bed mobility comments: patient able to initiate LEs movement to EOB with assist and Cues, but required max to total assist to elevate trunk to EOB, use of chuck pad and helicopter technique  Transfers                    Ambulation/Gait                 Stairs            Wheelchair Mobility     Modified Rankin (Stroke Patients Only)       Balance Overall balance assessment: Needs assistance Sitting-balance support: Feet supported Sitting balance-Leahy Scale: Zero Sitting balance - Comments: Significnatly limited with trunk control and posture, heavy assist provided +2, patient with inability to completely engage core or perform dynamic movements over BOS Postural control: Posterior lean;Right lateral lean                          Cognition Arousal/Alertness: Awake/alert Behavior During Therapy: Flat affect Overall Cognitive Status: Difficult to assess Area of Impairment: Following commands     Memory: Decreased short-term memory Following Commands: Follows one step commands consistently Safety/Judgement: Decreased awareness of safety;Decreased awareness of deficits          Exercises      General Comments General comments (skin integrity, edema, etc.): Patient tolerated EOB activities >10 minutes with increased support, attempted dynamic trunk control activities with manual faciliation, attempted lateral leans and reaching activities with rail.  Poor ability to control posture and grasp with UEs this session.      Pertinent Vitals/Pain Pain Assessment: Faces Faces Pain Scale: Hurts even more Pain Location: abdomen/stomach Pain Descriptors / Indicators: Grimacing Pain Intervention(s): Limited activity within patient's tolerance;Monitored during session;Repositioned    Home Living                      Prior Function            PT Goals (  current goals can now be found in the care plan section) Acute Rehab PT Goals PT Goal Formulation: With patient Time For Goal Achievement: 01/28/14 Potential to Achieve Goals: Fair Progress towards PT goals: Progressing toward goals    Frequency  Min 2X/week    PT Plan Discharge plan needs to be updated    Co-evaluation             End of Session Equipment Utilized During Treatment:  Other (comment) (vent) Activity Tolerance: Patient limited by fatigue; Patient left: in bed;     Time: 4854-6270 PT Time Calculation (min) (ACUTE ONLY): 31 min  Charges:  $Therapeutic Activity: 23-37 mins                    G CodesDuncan Dull 04-Feb-2014, 3:51 PM Alben Deeds, [place] DPT  [phone]

## 2014-01-23 NOTE — Progress Notes (Signed)
PULMONARY / CRITICAL CARE MEDICINE   Name: Dale Taylor MRN: [id number] DOB: 1940/11/11    ADMISSION DATE:  01/03/2014 CONSULTATION DATE:  12/20  REFERRING MD :  FPTS  CHIEF COMPLAINT:  Respiratory failure    INITIAL PRESENTATION:  74 yo male CRI, prostate ca admitted with A on C renal failure, hyperkalemia, HCAP, UTI.    STUDIES/EVENTS:  12/20 Admitted to [facility] wit dx of PNA 12/20 Transferred to ICU with respiratory acidosis, hypersomnolence. PCCM consultation and assumption of care. Treated initially with NPPV 12/21 TTE:  EF 40 to 41%, grade 1 diastolic dysfx 93/79 Renal US:  No hydronephrosis 12/21 PAF with RVR. Amiodarone started. Converted to NSR. Cards consult obtained. Recommended continuing current rx. No anticoagulation given high risk 12/22 Increased dyspnea. White out of R hemithorax on CXR. Intubated. Copious purulent secretions from ETT.  12/22 CT chest:  extensive pleural thickening on R. R lung with reduced volume. 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 Bronchomalacia noted 12/29 Re-intubation  01/02 Family conference. Discussed options and pt wished to proceed with trach tube placement and likely LTACH 01/04 Hyper K+, brady, d/ced lopressor. Treated hyperK+. 01/05 Recurrent R lung collapse by CXR. Tracheostomy tube placed. 01/06 Increasing left lung interstitial opacities suspicious for edema, improved aeration RLL. No pnx. 01/06 Recurrent R lung collapse. Repeat bronchoscopy with removal of airway secretions. BAL specimen sent for cytology > atypical cells in background of extensive inflammation 01/07 Transferred to vent SDU bed 1/11: PEG placed   INDWELLING DEVICES:: ETT 12/22 >> 12/24, 12/29 >> 01/06 L IJ CVL 12/22 >>  Trach tube 01/06 >>  PEG 1/11  MICRO DATA: MRSA PCR 12/20 >> NEG Urine 12/20 >> NEG Resp 12/22 >> NOF Blood 12/20 >> NEG Resp 12/29 >> NOF C diff  01/04 >> NEG URine 01/06 >>  >100K multiple organisms Resp (BAL) 01/06 >> MSSA Blood 01/06 >>   ANTIMICROBIALS:  Vanc 12/20 >> 12/22, 01/06 >> 1/10 Cefepime 12/20 >> 12/28, 01/06 >> 1/10 Ancef 1/11 >>   SUBJECTIVE/OVERNIGHT/INTERVAL HX Appears comfortable   VITAL SIGNS: Temp:  [98.2 F (36.8 C)-98.8 F (37.1 C)] 98.2 F (36.8 C) (01/13 0738) Pulse Rate:  [76-98] 88 (01/13 0824) Resp:  [18-32] 30 (01/13 0824) BP: (105-162)/(61-88) 162/61 mmHg (01/13 0824) SpO2:  [91 %-96 %] 95 % (01/13 0824) FiO2 (%):  [40 %] 40 % (01/13 0824) Weight:  [81.4 kg (179 lb 7.3 oz)] 81.4 kg (179 lb 7.3 oz) (01/13 0400)  Vent Mode:  [-] PRVC FiO2 (%):  [40 %] 40 % Set Rate:  [14 bmp] 14 bmp Vt Set:  [600 mL] 600 mL PEEP:  [8 cmH20] 8 cmH20 Plateau Pressure:  [22 cmH20-29 cmH20] 25 cmH20INTAKE / OUTPUT:  Intake/Output Summary (Last 24 hours) at 01/23/14 0849 Last data filed at 01/23/14 0600  Gross per 24 hour  Intake 1582.42 ml  Output    850 ml  Net 732.42 ml    PHYSICAL EXAMINATION: General: NAD on full vent support  Neuro :RASS 0. No focal deficits, weak, able to sit at bedside but fatigues easily  HEENT: WNL, trach unremarkable Cardiovascular: reg, no M Lungs: diminished BS on R, no wheezes, trach clean Abdomen:  Obese, soft, +bs- active, suprapubic catheter, PEG good Ext:  Warm, no edema, compression hose in place  LABS:  Recent Labs Lab 01/20/14 0500 01/22/14 0545 01/23/14 0605  NA 146* 147* 145  K 3.8 4.5 4.2  CL 109  114* 111  CO2 27 27 29   BUN 44* 33* 36*  CREATININE 1.45* 1.30 1.27  GLUCOSE 142* 110* 221*    Recent Labs Lab 01/20/14 0500 01/21/14 0524 01/22/14 0630  HGB 8.9* 8.3* 9.1*  HCT 28.3* 25.9* 29.7*  WBC 15.7* 12.6* 16.0*  PLT 362 355 386     CXR: persistent/Recurrent collapse/white out of R lung-->a little improved aeration on 1/12  ASSESSMENT / PLAN:   Acute on chronic hypercarbic respiratory failure, in setting of HCAP and purulent  bronchitis (MSSA)  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 Plan:   Cont vent support - Wean in PSV, hold PEEP @ 8 Cont vent bundle Cont nebulized BDs  Cont chest percussion Have spoken to SLP. We will look at In-line PMV trials towards end of this week, early next Changed to ancef for MSSA PNA on day 2/10, will complete 10d total  Cont PT efforts Likely will need vent/SNF  Hx of CAD, HTN, HLD, CHF, PVD. PAF this hospitalization. Deemed poor candidate for chronic anticoagulation Plan:  Cont ASA, statin Holding clopidogrel  Cont amiodarone  Acute on chronic renal failure (baseline scr 1.35) Mild Hypernatremia BPH, prostate cancer Plan:   Monitor BMET intermittently Monitor I/Os Correct electrolytes as indicated Cont free water  Holding Proscar, flomax, tasigna for now  Tracheostomy associated dysphagia Hx of GERD S/p PEG 1/11 Plan:   SUP: Famotidine Resume Tubefeeds.   Acute metabolic encephalopathy, resolved Chronic pain Chronic HA- seems to be occipital, radiating bilat to vertex. Severe deconditioning P:   Resume PRN dilaudid  PT eval Will need LTACH  ICU associated anemia without overt blood loss; superimposed on Anemia of chronic disease CML Plan:  DVT px: SQ heparin Monitor CBC intermittently   DM 2, controlled Hx of gout P:   Cont SSI  NP Summary Slow progress, unlikely to be fully liberated from vent. Need to focus on things that should improve QOL in a pt who is likely to be vent dependent: In-line PMV, PT efforts etc... Looks like he will be going to Vent-SNF at some point. Will go ahead and get PICC line placed.   Erick Colace ACNP-BC [place] Pager  # [phone] OR # [phone] if no answer  A-fib with RVR noted, will give an extra dose of lopressor IV, continue amiodarone and PO lopressor.  If continues to be elevated will add PO cardizem BP permitting.  Awaiting vent SNF placement.  Progress in vent weaning is very poor.  PICC line ordered.  Will hold in SDU vent bed for now.  Patient seen and examined, agree with above note.  I dictated the care and orders written for this patient under my direction.  Rush Farmer, MD [phone]

## 2014-01-23 NOTE — Progress Notes (Signed)
Pt is unable to wean at this time on the vent.  Pt has a PEEP of 8cmH20 and high expiratory minute ventilaton of 17.1.  Respiratory will continue to monitor patient, but at this time, no wean.

## 2014-01-23 NOTE — Clinical Social Work Note (Signed)
CSW informed that patient was denied for [facility].  CSW spoke with patients wife and daughter and informed that SNF is now the recommended options. Family is agreeable at this time but hopeful that patient will be able to go to a more local option.  CSW spoke with Marzetta Board at [facility]- no bed available today- checking to see if bed would open up soon  CSW left message at [facility].  CSW called [facility]- left message  CSW will continue to follow.  Domenica Reamer, [place] Social Worker [phone]

## 2014-01-24 ENCOUNTER — Inpatient Hospital Stay (HOSPITAL_COMMUNITY): Payer: Medicare HMO

## 2014-01-24 LAB — GLUCOSE, CAPILLARY
GLUCOSE-CAPILLARY: 146 mg/dL — AB (ref 70–99)
GLUCOSE-CAPILLARY: 149 mg/dL — AB (ref 70–99)
Glucose-Capillary: 134 mg/dL — ABNORMAL HIGH (ref 70–99)
Glucose-Capillary: 130 mg/dL — ABNORMAL HIGH (ref 70–99)
Glucose-Capillary: 155 mg/dL — ABNORMAL HIGH (ref 70–99)
Glucose-Capillary: 148 mg/dL — ABNORMAL HIGH (ref 70–99)

## 2014-01-24 MED ORDER — SODIUM CHLORIDE 0.9 % IJ SOLN
10.0000 mL | Freq: Two times a day (BID) | INTRAMUSCULAR | Status: DC
  Administered 2014-01-24 – 2014-01-27 (×7): 10 mL
  Administered 2014-01-28 – 2014-01-29 (×2): 20 mL
  Administered 2014-01-30 – 2014-02-03 (×7): 10 mL

## 2014-01-24 MED ORDER — SODIUM CHLORIDE 0.9 % IJ SOLN
10.0000 mL | INTRAMUSCULAR | Status: DC | PRN
  Administered 2014-01-28: 10 mL
  Administered 2014-02-02: 20 mL
  Administered 2014-02-03 (×2): 10 mL
  Filled 2014-01-24 (×4): qty 40

## 2014-01-24 MED ORDER — HYDROMORPHONE HCL 2 MG PO TABS
1.0000 mg | ORAL_TABLET | ORAL | Status: DC | PRN
  Administered 2014-01-24 – 2014-01-27 (×14): 1 mg via ORAL
  Filled 2014-01-24 (×14): qty 1

## 2014-01-24 MED ORDER — HYDROMORPHONE HCL 1 MG/ML IJ SOLN
1.0000 mg | Freq: Once | INTRAMUSCULAR | Status: AC
  Administered 2014-01-24: 1 mg via INTRAVENOUS
  Filled 2014-01-24: qty 1

## 2014-01-24 NOTE — Progress Notes (Signed)
Peripherally Inserted Central Catheter/Midline Placement  The IV Nurse has discussed with the patient and/or persons authorized to consent for the patient, the purpose of this procedure and the potential benefits and risks involved with this procedure.  The benefits include less needle sticks, lab draws from the catheter and patient may be discharged home with the catheter.  Risks include, but not limited to, infection, bleeding, blood clot (thrombus formation), and puncture of an artery; nerve damage and irregular heat beat.  Alternatives to this procedure were also discussed.  PICC/Midline Placement Documentation    Consent obtained from daughter    Dale Taylor 01/24/2014, 1:39 PM

## 2014-01-24 NOTE — Progress Notes (Signed)
01/24/2014 Rn notice under trach patient had a wound on the right it is about 0.5 x 0.5. Area was clean with Normal Saline and path dry and foam dressing was applied. Rn did pass it on to third shift nurse Merleen Nicely that area was there. [facility] RN.

## 2014-01-24 NOTE — Progress Notes (Signed)
Speech Therapy and I met at this time for in-line PMSV. Changes were made to ventilator during trial for pt to ventilate adequate during cuff deflation. For optimal tolerance/ventilation, settings were as follows: PRVC 1032m, Peep +3, Fio2 40%, RR 14, I time 0.9, and trigger of -2. After 30-45 minutes on PMSV, settings were then returned to previous settings of: PRVC 6073m RR 14, PEEP +8, Fio2 40%, I time of 0.8, and trigger 2. Pt tolerated well. RT will continue to monitor.

## 2014-01-24 NOTE — Evaluation (Signed)
[facility] Speaking Valve - Evaluation Patient Details  Name: Dale Taylor MRN: [id number] Date of Birth: 1940/01/23  Today's Date: 01/24/2014 Time: 90-1520 SLP Time Calculation (min) (ACUTE ONLY): 50 min  Past Medical History:  Past Medical History  Diagnosis Date  . Hypertension   . Hypercholesterolemia   . GERD (gastroesophageal reflux disease)   . Dyspnea on exertion   . Claudication in peripheral vascular disease   . PVD (peripheral vascular disease)   . S/P renal artery angioplasty 2009--- W/ STENT X1  TO LEFT COMMON ILIAC    . ED (erectile dysfunction)   . History of BPH   . Frequency of urination   . Urgency of urination   . Nocturia   . Chronic gouty arthritis FEET  . Diabetes mellitus     type II-- ORAL MED  . Prostate cancer SCHEDULED FOR RADIOACTIVE SEED IMPLANTS OF PROSTATE  05-18-2011    FOLLOWED BY DR [facility] AND DR Valere Dross  . CML (chronic myelocytic leukemia) FOLLOWED BY DR LEWIS ([place])    TAKES SPRYCEL PO MED FOR TX--  PT STATES STABLE  NO SYMPTOMS  . Iron deficiency anemia   . Chronic renal insufficiency   . PAD (peripheral artery disease)   . Cardiovascular disease with arteriosclerosis   . Blood transfusion LAST TIME NOV 2012  --  X2  UNITS  (FOR ANEMIA AND THROMBOCYTOPENIA  . Leukocytosis   . Peripheral edema   . History of thrombocytopenia   . Heart murmur   . CHF (congestive heart failure) ADMISSION NOV 2012  [facility])  REQUESTED RECORDS    DIATOSLIC CHF WITH LEFT VENTRICULAR IMPAIRED RELAXATION-----   CARDIOLOGIST----  DR Dallie Piles  . Hypertension   . Arthritis   . History of angina   . Acute renal failure Admitted to [facility] 06/06/12 with hip fracture; creatinine 4.3, K 7.8. Aldactone and lisinopril stopped   Past Surgical History:  Past Surgical History  Procedure Laterality Date  . Lung lobectomy  AGE 74 (APPROX.)    RIGHT LOWER LOBECTOMY 20 YEARS AGO, NOT CANCER (PROBLEM HEMORRHAGE)  . Hemorrhoid surgery    .  Transthoracic echocardiogram  12-05-2010  [facility])    LEFT VENTRICULAR HYPERTROPHY / PRESERVED SYSTOLIC FUNCTION/ EF 40-98%/  IMPAIRED LEFT VENTRICULAR RELAXATION WITH LEFT ATRIAL ENLARGEMENT/ MILD MITRAL AND TRICUSPID REGURG.  . Cataract extraction w/ intraocular lens  implant, bilateral  2012  . Aortogram/ angioplasty and stenting of left common iliac artery  04-07-2007   DR FIELDS    LEFT COMMON ILIAC STENOSIS 80%, STENTED/ BILATERAL OCCLUSION ANTERIOR TIBIAL ARTERY/ BILATERAL INCOMPLETE PLANTAR ARCH FILLED BY PERONEAL AND POSTERIOR TIBIAL ARTERY/ DIFFUSE BUT PATENT SUPERFICIIAL FEMORAL ARTERIES BILATERLLY  . Cardiovascular stress test  05-19-2011  DR ROBERT KRASOWSKI    NO EVIDENCE OF ISCHEMIA/ NORMAL LVSF/ EF 55%  . Radioactive seed implant  07/21/2011    Procedure: RADIOACTIVE SEED IMPLANT;  Surgeon: Joie Bimler, MD;  Location: [facility];  Service: Urology;  Laterality: N/A;    72   seeds implanted  . Cystoscopy  07/21/2011    Procedure: CYSTOSCOPY;  Surgeon: Joie Bimler, MD;  Location: [facility];  Service: Urology;  Laterality: N/A;  no seeds found in bladder  . Tracheostomy  01/15/14 feinstein    feinstein   HPI:  74 y.o. male admitted with respiratory failure; tracheostomy, acute on chronic renal failure, right lung collapse, HCAP and UTI. The patient has a history of prostate cancer, PAF, CAD.  Intubated  12/20-12/24; reintubated12/29; trach 1/5; continues on vent. Orders received for inline PMV.  Pt meets criteria for inline valve trials.       Assessment / Plan / Recommendation Clinical Impression  Pt presented with excellent toleration of in-line PMV during initial assessment.  SLP and RT collaborated to manage valve placement.  Pt met assessment criteria (40-50% loss of exhaled Vt s/p cuff deflation and desired drop in peak pressure indicative of patent upper airway.) See RT note for details re: vent setting changes and volume compensations.   Pt tolerated cuff deflation over 35 minute period.  With valve placed inline and vent adjustments made, he achieved low-pitch, mildly hoarse phonation.  He verbalized at conversational levels with staff and his daughter.  Brief drop in Sp02 below 90% was corrected by RT adjustments.  RR ranged from mid 20s to-33.  Pt asserted that comfort level was sufficient with valve in place.  Upon removal, he stated that using the valve helped him "feel more confident."  Cuff was reinflated and baseline vent settings were resumed per RT.  Recommend initiation of in-line PMV use with SLP and RT present; increasing frequency and length of use over time.    SLP Assessment  Patient needs continued Speech Lanaguage Pathology Services    Follow Up Recommendations       Frequency and Duration min 2x/week  2 weeks       SLP Goals Potential to Achieve Goals (ACUTE ONLY): Good   PMSV Trial  PMSV was placed for: 35 minutes Able to redirect subglottic air through upper airway: Yes Able to Attain Phonation: Yes Voice Quality: Hoarse (low pitch) Able to Expectorate Secretions: Yes Level of Secretion Expectoration with PMSV: Oral Breath Support for Phonation: Mildly decreased Intelligibility: Intelligibility reduced Word: 75-100% accurate Phrase: 75-100% accurate Sentence: 75-100% accurate Conversation: 75-100% accurate Respirations During Trial: 30 SpO2 During Trial: 91 % Behavior: Alert;Controlled;Cooperative   Tracheostomy Tube       Vent Dependency  Vent Dependent: Yes Vent Mode: PRVC Set Rate: 14 bmp PEEP: 8 cmH20 FiO2 (%): 40 % Vt Set: 600 mL    Cuff Deflation Trial Tolerated Cuff Deflation: Yes Length of Time for Cuff Deflation Trial: 40 Behavior: Alert;Controlled;Cooperative;Good eye contact   Juan Quam Laurice 01/24/2014, 5:26 PM  Estill Bamberg L. Tivis Ringer, [place] CCC/SLP Pager [phone]

## 2014-01-24 NOTE — Progress Notes (Signed)
[facility] Progress Note Patient Name: Dale Taylor DOB: 20-Jun-1940 MRN: [id number]   Date of Service  01/24/2014  HPI/Events of Note  Trach site pain  eICU Interventions  Extra dose dilaudid     Intervention Category Intermediate Interventions: Pain - evaluation and management  Shamona Wirtz 01/24/2014, 7:04 AM

## 2014-01-24 NOTE — Progress Notes (Signed)
PULMONARY / CRITICAL CARE MEDICINE   Name: Dale Taylor MRN: [id number] DOB: 1940/03/06    ADMISSION DATE:  12/18/2013 CONSULTATION DATE:  12/20  REFERRING MD :  FPTS  CHIEF COMPLAINT:  Respiratory failure    INITIAL PRESENTATION:  74 yo male CRI, prostate ca admitted with A on C renal failure, hyperkalemia, HCAP, UTI.    STUDIES/EVENTS:  12/20 Admitted to [facility] wit dx of PNA 12/20 Transferred to ICU with respiratory acidosis, hypersomnolence. PCCM consultation and assumption of care. Treated initially with NPPV 12/21 TTE:  EF 40 to 84%, grade 1 diastolic dysfx 16/60 Renal US:  No hydronephrosis 12/21 PAF with RVR. Amiodarone started. Converted to NSR. Cards consult obtained. Recommended continuing current rx. No anticoagulation given high risk 12/22 Increased dyspnea. White out of R hemithorax on CXR. Intubated. Copious purulent secretions from ETT.  12/22 CT chest:  extensive pleural thickening on R. R lung with reduced volume. 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 Bronchomalacia noted 12/29 Re-intubation  01/02 Family conference. Discussed options and pt wished to proceed with trach tube placement and likely LTACH 01/04 Hyper K+, brady, d/ced lopressor. Treated hyperK+. 01/05 Recurrent R lung collapse by CXR. Tracheostomy tube placed. 01/06 Increasing left lung interstitial opacities suspicious for edema, improved aeration RLL. No pnx. 01/06 Recurrent R lung collapse. Repeat bronchoscopy with removal of airway secretions. BAL specimen sent for cytology > atypical cells in background of extensive inflammation 01/07 Transferred to vent SDU bed 1/11: PEG placed   INDWELLING DEVICES:: ETT 12/22 >> 12/24, 12/29 >> 01/06 L IJ CVL 12/22 >>  Trach tube 01/06 >>  PEG 1/11  MICRO DATA: MRSA PCR 12/20 >> NEG Urine 12/20 >> NEG Resp 12/22 >> NOF Blood 12/20 >> NEG Resp 12/29 >> NOF C diff  01/04 >> NEG URine 01/06 >>  >100K multiple organisms Resp (BAL) 01/06 >> MSSA Blood 01/06 >>   ANTIMICROBIALS:  Vanc 12/20 >> 12/22, 01/06 >> 1/10 Cefepime 12/20 >> 12/28, 01/06 >> 1/10 Ancef 1/11 >>   SUBJECTIVE/OVERNIGHT/INTERVAL HX C/o headache.  Denies SOB.  Blood tinged sputum in vent tubing.  Failed SBT,   VITAL SIGNS: Temp:  [97.8 F (36.6 C)-98.5 F (36.9 C)] 97.8 F (36.6 C) (01/14 0809) Pulse Rate:  [68-148] 82 (01/14 0809) Resp:  [16-29] 24 (01/14 0809) BP: (121-157)/(55-86) 155/69 mmHg (01/14 0809) SpO2:  [90 %-98 %] 97 % (01/14 0814) FiO2 (%):  [40 %] 40 % (01/14 0814) Weight:  [181 lb (82.1 kg)] 181 lb (82.1 kg) (01/14 0400)  Vent Mode:  [-] PRVC FiO2 (%):  [40 %] 40 % Set Rate:  [14 bmp] 14 bmp Vt Set:  [600 mL] 600 mL PEEP:  [8 cmH20] 8 cmH20INTAKE / OUTPUT:  Intake/Output Summary (Last 24 hours) at 01/24/14 1055 Last data filed at 01/24/14 0700  Gross per 24 hour  Intake   1015 ml  Output   1150 ml  Net   -135 ml    PHYSICAL EXAMINATION: General: NAD on full vent support  Neuro :RASS 0. No focal deficits, weak, able to sit at bedside but fatigues easily, appropriate, MAE HEENT: WNL, trach unremarkable Cardiovascular: reg, no M Lungs: diminished BS on R, no wheezes, bloody secretions in vent tubing, no obvious bleeding around trach Abdomen:  Obese, soft, +bs- active, suprapubic catheter, PEG c/d  Ext:  Warm, no edema, compression hose in place  LABS:  Recent Labs Lab 01/20/14 0500 01/22/14 0545 01/23/14  0605  NA 146* 147* 145  K 3.8 4.5 4.2  CL 109 114* 111  CO2 27 27 29   BUN 44* 33* 36*  CREATININE 1.45* 1.30 1.27  GLUCOSE 142* 110* 221*    Recent Labs Lab 01/20/14 0500 01/21/14 0524 01/22/14 0630  HGB 8.9* 8.3* 9.1*  HCT 28.3* 25.9* 29.7*  WBC 15.7* 12.6* 16.0*  PLT 362 355 386     CXR: no new cxr 1/14  ASSESSMENT / PLAN:   Acute on chronic hypercarbic respiratory failure, in setting of HCAP and purulent bronchitis  (MSSA)  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 Plan:   Cont vent support - Wean in PSV as tol, hold PEEP @ 8 Cont vent bundle Cont nebulized BDs  Cont chest percussion Speech to eval for In-line PMV trials towards end of this week, early next Ancef 1/11>>> planned 10 days total  Cont PT efforts Likely will need vent/SNF For PICC placement - d/c L IJ CVL once placed   Hx of CAD, HTN, HLD, CHF, PVD. PAF this hospitalization. Deemed poor candidate for chronic anticoagulation Plan:  Cont ASA, statin Holding clopidogrel  Cont amiodarone, PO lopressor   Acute on chronic renal failure (baseline scr 1.35) Mild Hypernatremia BPH, prostate cancer Plan:   Monitor BMET intermittently Monitor I/Os Correct electrolytes as indicated Cont free water  Holding Proscar, flomax, tasigna for now  Tracheostomy associated dysphagia Hx of GERD S/p PEG 1/11 Plan:   SUP: Famotidine Resume Tubefeeds.   Acute metabolic encephalopathy, resolved Chronic pain Chronic HA- seems to be occipital, radiating bilat to vertex. Severe deconditioning P:   Resume PRN dilaudid  PT eval Will need vent SNF   ICU associated anemia without overt blood loss; superimposed on Anemia of chronic disease CML Plan:  DVT px: SQ heparin Monitor CBC intermittently   DM 2, controlled Hx of gout P:   Cont SSI  Awaiting speech eval for in-line PMV.  Continue pt/ot efforts, PICC placement pending.  Doubt will ever be fully liberated from vent.  SW searching for vent SNF.   Nickolas [place], NP 01/24/2014  10:55 AM Pager: [phone] or [phone]  Replace PICC, patient appears comfortable on vent, awaiting vent SNF  placement.  Patient seen and examined, agree with above note.  I dictated the care and orders written for this patient under my direction.  Rush Farmer, MD [phone]

## 2014-01-24 NOTE — Trach Care Team (Signed)
[facility] Progression Note   Patient Details Name: Dale Taylor MRN: [id number] DOB: [birth year] Today's Date: 01/24/2014   Tracheostomy Assessment    Tracheostomy Shiley 6 mm Cuffed (Active)  Status Secured 01/24/2014 12:45 PM  Site Assessment Oozing secretions;Drainage 01/24/2014 12:45 PM  Site Care Cleansed 01/24/2014 12:10 PM  Inner Cannula Care Changed/new 01/24/2014 12:10 PM  Ties Assessment Clean;Dry;Secure 01/24/2014 12:45 PM  Cuff pressure (cm) 28 cm 01/24/2014  4:57 AM  Emergency Equipment at bedside Yes 01/24/2014 12:45 PM     Care Needs Suture removal post-op day #7 :  (sutures have been removed)   Respiratory Therapy O2 Device: Ventilator FiO2 (%): 40 % SpO2: 99 % Education:  (not needed at this time) Follow up recommendations:  (will follow as needed ) Respiratory barriers to progression:  (per pccm, pt is likely vent dependant, plan is vent snf)    Speech Language Pathology  SLP chart review complete: Patient not ready for SLP services (? in-line in future, trach site bleed/pain, tracheomalacia)   Physical Therapy Ambulation/Gait assistance:  (pt unable) PT Recommendation/Assessment: Patient needs continued PT services Follow Up Recommendations: SNF PT equipment: None recommended by PT    Occupational Therapy      Nutritional Patient's Current Diet: Tube feeding via PEG Tube Feeding: Vital AF 1.2 Cal Tube Feeding Frequency: Continuous Tube Feeding Strength: Full strength    Case Management/Social Work Level of patient care prior to hospitalization: SNF/Assisted  living facility Insurance payer: Medicare Barriers to progression:  Anticipated discharge disposition: SNF/Assisted  living facility    Provider [place] Team/Provider                          Recommendations  [place] Team Members Present-  Ciro Backer, RT & Molli Barrows, RD      None          Kam Rahimi, Jaci Carrel ((scribe for team)  01/24/2014, 2:24 PM

## 2014-01-24 NOTE — Progress Notes (Signed)
eLink Physician-Brief Progress Note Patient Name: Dale Taylor DOB: [birth year] MRN: [id number]   Date of Service  01/24/2014  HPI/Events of Note  Pain management now adequate on q6h dilaudid po  eICU Interventions  Increased dilaudid to q 3h which takes into account its shorter half life        [name] 01/24/2014, 9:18 PM

## 2014-01-24 NOTE — Progress Notes (Signed)
Per MD note and per updated policy, pt does meet wean just has to remain on a PEEP of 8, however, pt failed, increased RR and Minute ventilation above 15.0L/min.

## 2014-01-25 LAB — CBC
HEMATOCRIT: 28.7 % — AB (ref 39.0–52.0)
Hemoglobin: 9.2 g/dL — ABNORMAL LOW (ref 13.0–17.0)
MCH: 30.1 pg (ref 26.0–34.0)
MCHC: 32.1 g/dL (ref 30.0–36.0)
MCV: 93.8 fL (ref 78.0–100.0)
Platelets: 359 10*3/uL (ref 150–400)
RBC: 3.06 MIL/uL — ABNORMAL LOW (ref 4.22–5.81)
RDW: 16.3 % — ABNORMAL HIGH (ref 11.5–15.5)
WBC: 18 10*3/uL — AB (ref 4.0–10.5)

## 2014-01-25 LAB — GLUCOSE, CAPILLARY
GLUCOSE-CAPILLARY: 153 mg/dL — AB (ref 70–99)
GLUCOSE-CAPILLARY: 134 mg/dL — AB (ref 70–99)
Glucose-Capillary: 135 mg/dL — ABNORMAL HIGH (ref 70–99)
Glucose-Capillary: 136 mg/dL — ABNORMAL HIGH (ref 70–99)
Glucose-Capillary: 150 mg/dL — ABNORMAL HIGH (ref 70–99)
Glucose-Capillary: 165 mg/dL — ABNORMAL HIGH (ref 70–99)

## 2014-01-25 LAB — BASIC METABOLIC PANEL
Anion gap: 7 (ref 5–15)
BUN: 31 mg/dL — AB (ref 6–23)
CALCIUM: 8.7 mg/dL (ref 8.4–10.5)
CHLORIDE: 112 meq/L (ref 96–112)
CO2: 30 mmol/L (ref 19–32)
Creatinine, Ser: 0.99 mg/dL (ref 0.50–1.35)
GFR calc non Af Amer: 79 mL/min — ABNORMAL LOW (ref >90)
Glucose, Bld: 182 mg/dL — ABNORMAL HIGH (ref 70–99)
POTASSIUM: 4.6 mmol/L (ref 3.5–5.1)
SODIUM: 149 mmol/L — AB (ref 135–145)

## 2014-01-25 MED ORDER — ENOXAPARIN SODIUM 40 MG/0.4ML ~~LOC~~ SOLN
40.0000 mg | SUBCUTANEOUS | Status: DC
  Administered 2014-01-25 – 2014-02-04 (×11): 40 mg via SUBCUTANEOUS
  Filled 2014-01-25 (×11): qty 0.4

## 2014-01-25 MED ORDER — OXYCODONE HCL 5 MG PO TABS
5.0000 mg | ORAL_TABLET | Freq: Three times a day (TID) | ORAL | Status: DC | PRN
  Administered 2014-01-25 – 2014-02-03 (×15): 5 mg via ORAL
  Filled 2014-01-25 (×15): qty 1

## 2014-01-25 MED ORDER — AMIODARONE HCL 200 MG PO TABS
200.0000 mg | ORAL_TABLET | Freq: Every day | ORAL | Status: DC
  Administered 2014-01-26 – 2014-02-04 (×10): 200 mg via ORAL
  Filled 2014-01-25 (×10): qty 1

## 2014-01-25 MED ORDER — TRAMADOL HCL 50 MG PO TABS
50.0000 mg | ORAL_TABLET | Freq: Four times a day (QID) | ORAL | Status: DC | PRN
  Administered 2014-01-25 – 2014-01-28 (×7): 50 mg via ORAL
  Filled 2014-01-25 (×7): qty 1

## 2014-01-25 MED ORDER — ACETAMINOPHEN 325 MG PO TABS: 650.0000 mg | ORAL_TABLET | Freq: Four times a day (QID) | ORAL | Status: DC | PRN

## 2014-01-25 NOTE — Clinical Social Work Note (Addendum)
2:00pm CSW spoke with [place] at [facility] faxed additional clinicals for review  11:30am CSW spoke with Marzetta Board at [facility] had anticipated a bed would be open but realized patient is [facility] which does not have any out of network benefits.  CSW attempted to contact [place] was considering making bed offer but CSW has been unable to get a hold of admissions after several messages yesterday.  CSW called [facility]- Left message  CSW faxed clinicals to [facility] in [place] to review and see if patient is appropriate- if appropriate CSW to discuss [facility] with family.  CSW will continue to follow.  Domenica Reamer, [place] Social Worker [phone]

## 2014-01-25 NOTE — Plan of Care (Signed)
Problem: Phase I Progression Outcomes Goal: Dyspnea controlled at rest Outcome: Completed/Met Date Met:  01/25/14 No complaints of dyspnea at rest

## 2014-01-25 NOTE — Progress Notes (Signed)
PULMONARY / CRITICAL CARE MEDICINE   Name: Dale Taylor MRN: [id number] DOB: 11/26/1940    ADMISSION DATE:  12/13/2013 CONSULTATION DATE:  12/20  REFERRING MD :  FPTS  CHIEF COMPLAINT:  Respiratory failure    INITIAL PRESENTATION:  74 yo male CRI, prostate ca admitted with A on C renal failure, hyperkalemia, HCAP, UTI.    STUDIES/EVENTS:  12/20 Admitted to [place] wit dx of PNA 12/20 Transferred to ICU with respiratory acidosis, hypersomnolence. PCCM consultation and assumption of care. Treated initially with NPPV 12/21 PAF with RVR. Amiodarone started. Converted to NSR. Cards consult obtained. Recommended continuing current rx. No anticoagulation given high risk 12/22 Increased dyspnea. White out of R hemithorax on CXR. Intubated. Copious purulent secretions from ETT.  12/24 Extubated 12/25 recurrent mucus plugging 12/27 Bronchoscopy for removal of airway secretions (Dale Taylor). Bronchomalacia noted 12/29 Re-intubation  01/02 Family conference. Discussed options and pt wished to proceed with trach tube placement and likely LTACH 01/04 Hyper K+, brady, d/ced lopressor. Treated hyperK+. 01/05 Recurrent R lung collapse by CXR. Tracheostomy tube placed. 01/06 Increasing left lung interstitial opacities suspicious for edema, improved aeration RLL. No pnx. 01/06 Recurrent R lung collapse. Repeat bronchoscopy with removal of airway secretions. BAL specimen sent for cytology > atypical cells in background of extensive inflammation 01/07 Transferred to vent SDU bed 01/11 PEG placed 01/14 start PMV trials  STUDIES: 12/21 TTE >>   EF 40 to 32%, grade 1 diastolic dysfx 95/18 Renal US >>   No hydronephrosis 12/23 CT chest >>  extensive pleural thickening on R. R lung with reduced volume. Bilateral R>L AS dz  INDWELLING DEVICES:: ETT 12/22 >> 12/24, 12/29 >> 01/06 Trach tube 01/06 >>  PEG 1/11 >> RT PICC 1/14 >>  MICRO DATA: Resp (BAL) 01/06 >> MSSA  ANTIMICROBIALS:  Vanc 12/20 >> 12/22,  01/06 >> 1/10 Cefepime 12/20 >> 12/28, 01/06 >> 1/10 Ancef 1/11 >>   SUBJECTIVE: C/o soreness at trach site.  VITAL SIGNS: Temp:  [98.4 F (36.9 C)-98.7 F (37.1 C)] 98.7 F (37.1 C) (01/15 0759) Pulse Rate:  [71-98] 85 (01/15 0759) Resp:  [17-32] 20 (01/15 0759) BP: (130-169)/(65-81) 163/71 mmHg (01/15 0759) SpO2:  [93 %-99 %] 96 % (01/15 0829) FiO2 (%):  [30 %-40 %] 30 % (01/15 0829) Weight:  [181 lb 14.1 oz (82.5 kg)] 181 lb 14.1 oz (82.5 kg) (01/15 0500)  Vent Mode:  [-] PRVC FiO2 (%):  [30 %-40 %] 30 % Set Rate:  [14 bmp] 14 bmp Vt Set:  [600 mL-1000 mL] 600 mL PEEP:  [3 cmH20-8 cmH20] 8 cmH20 Plateau Pressure:  [17 cmH20-26 cmH20] 17 cmH20INTAKE / OUTPUT:  Intake/Output Summary (Last 24 hours) at 01/25/14 1013 Last data filed at 01/25/14 0618  Gross per 24 hour  Intake   1510 ml  Output    980 ml  Net    530 ml    PHYSICAL EXAMINATION: General: anxious Neuro : follows commands HEENT: skin erosion at inferior border of trach with tan secretions Cardiovascular: regular Lungs: decreased BS on Rt, scattered rhonchi Abdomen:  Obese, soft, +bs- active, suprapubic catheter, PEG c/d  Ext:  Warm, no edema, compression hose in place  LABS:  Recent Labs Lab 01/22/14 0545 01/23/14 0605 01/25/14 0430  NA 147* 145 149*  K 4.5 4.2 4.6  CL 114* 111 112  CO2 27 29 30   BUN 33* 36* 31*  CREATININE 1.30 1.27 0.99  GLUCOSE 110* 221* 182*    Recent Labs Lab 01/21/14 0524 01/22/14  0630 01/25/14 0430  HGB 8.3* 9.1* 9.2*  HCT 25.9* 29.7* 28.7*  WBC 12.6* 16.0* 18.0*  PLT 355 386 359    Dg Chest Port 1 View  01/24/2014   CLINICAL DATA:  Central line placement  EXAM: PORTABLE CHEST - 1 VIEW  COMPARISON:  01/22/2014  FINDINGS: Cardiomediastinal silhouette is stable. Tracheostomy tube is unchanged in position. Stable left IJ central line with tip in SVC. There is right arm PICC line with tip in SVC. No pneumothorax. Persistent partial opacification of the right  hemithorax probable combination of loculated pleural effusion with right basilar atelectasis or infiltrate. There is mild interstitial prominence left lung suspicious for asymmetric pulmonary edema or pneumonitis.  IMPRESSION: Tracheostomy tube is unchanged in position. Stable left IJ central line with tip in SVC. There is right arm PICC line with tip in SVC. No pneumothorax. Persistent partial opacification of the right hemithorax probable combination of loculated pleural effusion with right basilar atelectasis or infiltrate. There is mild interstitial prominence left lung suspicious for asymmetric pulmonary edema or pneumonitis.   Electronically Signed   By: Lahoma Crocker M.D.   On: 01/24/2014 14:47     ASSESSMENT / PLAN:   Acute on chronic hypercarbic respiratory failure, in setting of HCAP and purulent bronchitis (MSSA).  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 Chronic R pleural dz. Tracheostomy status >> stoma erosion. Plan:   Continue vent support - Wean in PSV as tol, hold PEEP @ 8 Continue vent bundle Continue nebulized BDs  F/u with speech for in line PMV Ancef 1/11>>> planned 10 days total  Continue PT efforts Likely will need vent/SNF Wound care  Hx of CAD, HTN, HLD, CHF, PVD. PAF this hospitalization. Deemed poor candidate for chronic anticoagulation. Plan:  Continue ASA, lipitor Holding clopidogrel  Continue amiodarone >> change to 200 mg daily on 1/15 Continue lopressor   Acute on chronic renal failure (baseline scr 1.35). Mild Hypernatremia. BPH, prostate cancer. Plan:   Monitor BMET intermittently Monitor I/Os Correct electrolytes as indicated Cont free water  Holding Proscar, flomax, tasigna for now  Tracheostomy associated dysphagia. Hx of GERD. S/p PEG 1/11. Plan:   SUP: Famotidine Resume Tube feeds.  Acute metabolic encephalopathy, resolved. Chronic pain. Chronic HA- seems to be occipital, radiating bilat  to vertex. Severe deconditioning. P:   PRN dilaudid  PT eval Will need vent SNF   ICU associated anemia without overt blood loss; superimposed on Anemia of chronic disease CML. Plan:  Lovenox for DVT prevention Monitor CBC intermittently  DM 2, controlled. Hx of gout. P:   Continue SSI  Doubt will ever be fully liberated from vent.  SW searching for vent SNF.   Chesley Mires, MD [facility] Pulmonary/Critical Care 01/25/2014, 10:27 AM Pager:  [phone] After 3pm call: [phone]

## 2014-01-25 NOTE — Evaluation (Signed)
Speech Language Pathology Evaluation Patient Details Name: Dale Taylor MRN: [id number] DOB: 1940-01-20 Today's Date: 01/25/2014 Time: 8756-4332 SLP Time Calculation (min) (ACUTE ONLY): 25 min  Problem List:  Patient Active Problem List   Diagnosis Date Noted  . Malnutrition   . Acute respiratory failure   . Lung collapse 01/07/2014  . Mucus plugging of bronchi   . 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 Acute respiratory failure with hypercapnia   . Pneumonia 12/18/2013  . HCAP (healthcare-associated pneumonia) 12/29/2013  . Atrial fibrillation with RVR 07/05/2012  . Acute renal failure 06/30/2012  . Hyperkalemia 06/30/2012  . Iron deficiency anemia 06/30/2012  . Hypoglycemia 06/30/2012  . Altered mental status 06/30/2012  . HTN (hypertension) 06/30/2012  . Diabetes 06/30/2012  . Chronic diastolic heart failure 95/18/8416  . Intertrochanteric fracture of left hip 06/06/2012  . Urinary retention 06/06/2012  . Right lower lobe pneumonia 06/06/2012  . Prostate cancer 11/19/2010  . Hypercholesterolemia    Past Medical History:  Past Medical History  Diagnosis Date  . Hypertension   . Hypercholesterolemia   . GERD (gastroesophageal reflux disease)   . Dyspnea on exertion   . Claudication in peripheral vascular disease   . PVD (peripheral vascular disease)   . S/P renal artery angioplasty 2009--- W/ STENT X1  TO LEFT COMMON ILIAC    . ED (erectile dysfunction)   . History of BPH   . Frequency of urination   . Urgency of urination   . Nocturia   . Chronic gouty arthritis FEET  . Diabetes mellitus     type II-- ORAL MED  . Prostate cancer SCHEDULED FOR RADIOACTIVE SEED IMPLANTS OF PROSTATE  05-18-2011    FOLLOWED BY DR [facility] AND DR Valere Dross  . CML (chronic myelocytic leukemia) FOLLOWED BY DR LEWIS ([place])    TAKES SPRYCEL PO MED FOR TX--  PT STATES STABLE  NO SYMPTOMS  . Iron deficiency anemia   . Chronic renal insufficiency   . PAD  (peripheral artery disease)   . Cardiovascular disease with arteriosclerosis   . Blood transfusion LAST TIME NOV 2012  --  X2  UNITS  (FOR ANEMIA AND THROMBOCYTOPENIA  . Leukocytosis   . Peripheral edema   . History of thrombocytopenia   . Heart murmur   . CHF (congestive heart failure) ADMISSION NOV 2012  [facility])  REQUESTED RECORDS    DIATOSLIC CHF WITH LEFT VENTRICULAR IMPAIRED RELAXATION-----   CARDIOLOGIST----  DR Dallie Piles  . Hypertension   . Arthritis   . History of angina   . Acute renal failure Admitted to [facility] 06/06/12 with hip fracture; creatinine 4.3, K 7.8. Aldactone and lisinopril stopped   Past Surgical History:  Past Surgical History  Procedure Laterality Date  . Lung lobectomy  AGE 57 (APPROX.)    RIGHT LOWER LOBECTOMY 20 YEARS AGO, NOT CANCER (PROBLEM HEMORRHAGE)  . Hemorrhoid surgery    . Transthoracic echocardiogram  12-05-2010  [facility])    LEFT VENTRICULAR HYPERTROPHY / PRESERVED SYSTOLIC FUNCTION/ EF 60-63%/  IMPAIRED LEFT VENTRICULAR RELAXATION WITH LEFT ATRIAL ENLARGEMENT/ MILD MITRAL AND TRICUSPID REGURG.  . Cataract extraction w/ intraocular lens  implant, bilateral  2012  . Aortogram/ angioplasty and stenting of left common iliac artery  04-07-2007   DR FIELDS    LEFT COMMON ILIAC STENOSIS 80%, STENTED/ BILATERAL OCCLUSION ANTERIOR TIBIAL ARTERY/ BILATERAL INCOMPLETE PLANTAR ARCH FILLED BY PERONEAL AND POSTERIOR TIBIAL ARTERY/ DIFFUSE BUT PATENT SUPERFICIIAL FEMORAL ARTERIES BILATERLLY  . Cardiovascular stress test  05-19-2011  DR Jenne [facility]    NO EVIDENCE OF ISCHEMIA/ NORMAL LVSF/ EF 55%  . Radioactive seed implant  07/21/2011    Procedure: RADIOACTIVE SEED IMPLANT;  Surgeon: Joie Bimler, MD;  Location: [facility];  Service: Urology;  Laterality: N/A;    72   seeds implanted  . Cystoscopy  07/21/2011    Procedure: CYSTOSCOPY;  Surgeon: Joie Bimler, MD;  Location: [facility];  Service: Urology;   Laterality: N/A;  no seeds found in bladder  . Tracheostomy  01/15/14 feinstein    feinstein   HPI:  74 y.o. male admitted with respiratory failure; tracheostomy, acute on chronic renal failure, right lung collapse, HCAP and UTI. The patient has a history of prostate cancer, PAF, CAD.  Intubated 12/20-12/24; reintubated12/29; trach 1/5; continues on vent. Orders received for inline PMV.  Pt meets criteria for inline valve trials.       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 Pt required Min cues for functional communication at the word and short phrase level with use of eye gaze board, pointing with alphabet board, and use of mouthing/gestures. Pt will benefit from continued SLP services to maximize multimodal communication.    SLP Assessment  Patient needs continued Speech Lanaguage Pathology Services    Frequency and Duration min 2x/week  2 weeks   Pertinent Vitals/Pain Pain Assessment: 0-10 Pain Score: 8  Pain Location: pt reports generalized pain Pain Intervention(s): Patient requesting pain meds-RN notified;Monitored during session   SLP Goals  Patient/Family Stated Goal: none stated Potential to Achieve Goals (ACUTE ONLY): Good  SLP Evaluation Expression Expression Primary Mode of Expression: Nonverbal - gestures (assessment for AAC) Verbal Expression Overall Verbal Expression: Impaired Level of Generative/Spontaneous Verbalization: Phrase (with AAC) Non-Verbal Means of Communication: Eye gaze;Gestures;Communication board   Oral / Motor     GO      Dale Taylor, M.A. CCC-SLP [phone]  Dale Taylor 01/25/2014, 2:42 PM

## 2014-01-25 NOTE — Plan of Care (Signed)
Problem: Consults Goal: Nutrition Consult-if indicated Outcome: Completed/Met Date Met:  01/25/14 Pt is receiving continuous tube feed of Vital A.F. 1.2 @ 65 ml/hr

## 2014-01-26 ENCOUNTER — Inpatient Hospital Stay (HOSPITAL_COMMUNITY): Payer: Medicare HMO

## 2014-01-26 LAB — BASIC METABOLIC PANEL
Anion gap: 6 (ref 5–15)
BUN: 27 mg/dL — ABNORMAL HIGH (ref 6–23)
CO2: 26 mmol/L (ref 19–32)
Calcium: 7.3 mg/dL — ABNORMAL LOW (ref 8.4–10.5)
Chloride: 116 mEq/L — ABNORMAL HIGH (ref 96–112)
Creatinine, Ser: 0.8 mg/dL (ref 0.50–1.35)
GFR calc Af Amer: >90 mL/min (ref >90)
GFR calc non Af Amer: 87 mL/min — ABNORMAL LOW (ref >90)
Glucose, Bld: 163 mg/dL — ABNORMAL HIGH (ref 70–99)
Potassium: 3.9 mmol/L (ref 3.5–5.1)
Sodium: 148 mmol/L — ABNORMAL HIGH (ref 135–145)

## 2014-01-26 LAB — GLUCOSE, CAPILLARY
GLUCOSE-CAPILLARY: 160 mg/dL — AB (ref 70–99)
GLUCOSE-CAPILLARY: 162 mg/dL — AB (ref 70–99)
Glucose-Capillary: 116 mg/dL — ABNORMAL HIGH (ref 70–99)
Glucose-Capillary: 146 mg/dL — ABNORMAL HIGH (ref 70–99)
Glucose-Capillary: 147 mg/dL — ABNORMAL HIGH (ref 70–99)
Glucose-Capillary: 163 mg/dL — ABNORMAL HIGH (ref 70–99)

## 2014-01-26 MED ORDER — FREE WATER
300.0000 mL | Freq: Four times a day (QID) | Status: DC
  Administered 2014-01-26 – 2014-01-28 (×8): 300 mL

## 2014-01-26 NOTE — Progress Notes (Signed)
PULMONARY / CRITICAL CARE MEDICINE   Name: Dale Taylor MRN: [id number] DOB: 04/19/1940    ADMISSION DATE:  12/21/2013 CONSULTATION DATE:  12/20  REFERRING MD :  FPTS  CHIEF COMPLAINT:  Respiratory failure    INITIAL PRESENTATION:  74 yo male CRI, prostate ca admitted with A on C renal failure, hyperkalemia, HCAP, UTI.    STUDIES/EVENTS:  12/20 Admitted to [place] wit dx of PNA 12/20 Transferred to ICU with respiratory acidosis, hypersomnolence. PCCM consultation and assumption of care. Treated initially with NPPV 12/21 PAF with RVR. Amiodarone started. Converted to NSR. Cards consult obtained. Recommended continuing current rx. No anticoagulation given high risk 12/22 Increased dyspnea. White out of R hemithorax on CXR. Intubated. Copious purulent secretions from ETT.  12/24 Extubated 12/25 recurrent mucus plugging 12/27 Bronchoscopy for removal of airway secretions (Sood). Bronchomalacia noted 12/29 Re-intubation  01/02 Family conference. Discussed options and pt wished to proceed with trach tube placement and likely LTACH 01/04 Hyper K+, brady, d/ced lopressor. Treated hyperK+. 01/05 Recurrent R lung collapse by CXR. Tracheostomy tube placed. 01/06 Increasing left lung interstitial opacities suspicious for edema, improved aeration RLL. No pnx. 01/06 Recurrent R lung collapse. Repeat bronchoscopy with removal of airway secretions. BAL specimen sent for cytology > atypical cells in background of extensive inflammation 01/07 Transferred to vent SDU bed 01/11 PEG placed 01/14 start PMV trials  STUDIES: 12/21 TTE >>   EF 40 to 76%, grade 1 diastolic dysfx 28/31 Renal US >>   No hydronephrosis 12/23 CT chest >>  extensive pleural thickening on R. R lung with reduced volume. Bilateral R>L AS dz  INDWELLING DEVICES:: ETT 12/22 >> 12/24, 12/29 >> 01/06 Trach tube 01/06 >>  PEG 1/11 >> RT PICC 1/14 >>  MICRO DATA: Resp (BAL) 01/06 >> MSSA  ANTIMICROBIALS:  Vanc 12/20 >> 12/22,  01/06 >> 1/10 Cefepime 12/20 >> 12/28, 01/06 >> 1/10 Ancef 1/11 >>   SUBJECTIVE: C/o soreness at trach site.  VITAL SIGNS: Temp:  [97.6 F (36.4 C)-98.2 F (36.8 C)] 98.2 F (36.8 C) (01/16 0801) Pulse Rate:  [73-100] 85 (01/16 0801) Resp:  [19-32] 29 (01/16 0801) BP: (123-176)/(57-82) 123/82 mmHg (01/16 0801) SpO2:  [88 %-100 %] 99 % (01/16 0801) FiO2 (%):  [30 %-40 %] 40 % (01/16 0801) Weight:  [83.2 kg (183 lb 6.8 oz)] 83.2 kg (183 lb 6.8 oz) (01/16 0500)  Vent Mode:  [-] PRVC FiO2 (%):  [30 %-40 %] 40 % Set Rate:  [14 bmp] 14 bmp Vt Set:  [600 mL] 600 mL PEEP:  [8 cmH20] 8 cmH20 Plateau Pressure:  [19 cmH20-24 cmH20] 24 cmH20INTAKE / OUTPUT:  Intake/Output Summary (Last 24 hours) at 01/26/14 0917 Last data filed at 01/26/14 0800  Gross per 24 hour  Intake   1565 ml  Output   1220 ml  Net    345 ml    PHYSICAL EXAMINATION: General: anxious Neuro : follows commands HEENT: skin erosion at inferior border of trach with tan secretions Cardiovascular: regular Lungs: decreased BS on Rt, scattered rhonchi Abdomen:  Obese, soft, +bs- active, suprapubic catheter, PEG c/d  Ext:  Warm, no edema, compression hose in place  LABS:  Recent Labs Lab 01/23/14 0605 01/25/14 0430 01/26/14 0449  NA 145 149* 148*  K 4.2 4.6 3.9  CL 111 112 116*  CO2 29 30 26   BUN 36* 31* 27*  CREATININE 1.27 0.99 0.80  GLUCOSE 221* 182* 163*    Recent Labs Lab 01/21/14 0524 01/22/14 0630 01/25/14 0430  HGB 8.3* 9.1* 9.2*  HCT 25.9* 29.7* 28.7*  WBC 12.6* 16.0* 18.0*  PLT 355 386 359    Dg Chest Port 1 View  01/26/2014   CLINICAL DATA:  Healthcare associated pneumonia. Acute respiratory failure with hypercapnia.  EXAM: PORTABLE CHEST - 1 VIEW  COMPARISON:  01/24/2014  FINDINGS: Endotracheal tube in right arm PICC line remain in place. Left jugular central venous catheter has been removed since previous study. No pneumothorax visualized.  Increased complete opacification of right  hemithorax is now seen consistent with increased right lung consolidation or collapse. Left lung hyperinflation again which shift of mediastinal structures to right. There has been mild improvement in diffuse left lung airspace opacity since prior study.  IMPRESSION: Worsening diffuse right lung consolidation or collapse.  Mild improvement in left lung airspace disease since prior exam.   Electronically Signed   By: Earle Gell M.D.   On: 01/26/2014 09:14   Dg Chest Port 1 View  01/24/2014   CLINICAL DATA:  Central line placement  EXAM: PORTABLE CHEST - 1 VIEW  COMPARISON:  01/22/2014  FINDINGS: Cardiomediastinal silhouette is stable. Tracheostomy tube is unchanged in position. Stable left IJ central line with tip in SVC. There is right arm PICC line with tip in SVC. No pneumothorax. Persistent partial opacification of the right hemithorax probable combination of loculated pleural effusion with right basilar atelectasis or infiltrate. There is mild interstitial prominence left lung suspicious for asymmetric pulmonary edema or pneumonitis.  IMPRESSION: Tracheostomy tube is unchanged in position. Stable left IJ central line with tip in SVC. There is right arm PICC line with tip in SVC. No pneumothorax. Persistent partial opacification of the right hemithorax probable combination of loculated pleural effusion with right basilar atelectasis or infiltrate. There is mild interstitial prominence left lung suspicious for asymmetric pulmonary edema or pneumonitis.   Electronically Signed   By: Lahoma Crocker M.D.   On: 01/24/2014 14:47     ASSESSMENT / PLAN:   Acute on chronic hypercarbic respiratory failure, in setting of HCAP and purulent bronchitis (MSSA).  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 Recurrent 01/26/2014 Chronic R pleural dz. Tracheostomy status >> stoma erosion. Plan:   Continue vent support - Wean in PSV as tol, hold PEEP @ 8 Continue vent  bundle Continue nebulized BDs  F/u with speech for in line PMV Ancef 1/11>>> planned 10 days total  Continue PT efforts Likely will need vent/SNF Wound care Prob needs another FOB with total collapse R lung on CXR 01/26/2014     Hx of CAD, HTN, HLD, CHF, PVD. PAF this hospitalization. Deemed poor candidate for chronic anticoagulation. Plan:  Continue ASA, lipitor Holding clopidogrel  Continue amiodarone >> changed to 200 mg daily on 1/15 Continue lopressor   Acute on chronic renal failure (baseline scr 1.35). Persistent Hypernatremia. BPH, prostate cancer. Plan:   Monitor BMET intermittently Monitor I/Os Correct electrolytes as indicated Increase free water  Holding Proscar, flomax, tasigna for now  Tracheostomy associated dysphagia. Hx of GERD. S/p PEG 1/11. Plan:   SUP: Famotidine Resume Tube feeds.  Acute metabolic encephalopathy, resolved. Chronic pain. Chronic HA- seems to be occipital, radiating bilat to vertex. Severe deconditioning. P:   PRN dilaudid  PT eval Will need vent SNF   ICU associated anemia without overt blood loss; superimposed on Anemia of chronic disease CML. Plan:  Lovenox for DVT prevention Monitor CBC intermittently  DM 2, controlled. Hx of gout. P:   Continue SSI  Doubt  will ever be fully liberated from vent.  SW searching for vent SNF.   Mariel Sleet Angels 01/26/2014, 9:17 AM Beeper  [phone]  Cell  [phone]  If no response or cell goes to voicemail, call beeper [phone]

## 2014-01-26 NOTE — Progress Notes (Signed)
Performed Trach Care and Mouth Care.

## 2014-01-27 DIAGNOSIS — E46 Unspecified protein-calorie malnutrition: Secondary | ICD-10-CM

## 2014-01-27 LAB — GLUCOSE, CAPILLARY
Glucose-Capillary: 124 mg/dL — ABNORMAL HIGH (ref 70–99)
Glucose-Capillary: 127 mg/dL — ABNORMAL HIGH (ref 70–99)
Glucose-Capillary: 128 mg/dL — ABNORMAL HIGH (ref 70–99)
Glucose-Capillary: 135 mg/dL — ABNORMAL HIGH (ref 70–99)
Glucose-Capillary: 136 mg/dL — ABNORMAL HIGH (ref 70–99)
Glucose-Capillary: 183 mg/dL — ABNORMAL HIGH (ref 70–99)

## 2014-01-27 MED ORDER — HYDROMORPHONE HCL 2 MG PO TABS
4.0000 mg | ORAL_TABLET | ORAL | Status: DC | PRN
  Administered 2014-01-27 – 2014-01-31 (×8): 4 mg via ORAL
  Filled 2014-01-27 (×9): qty 2

## 2014-01-27 MED ORDER — FENTANYL 25 MCG/HR TD PT72
75.0000 ug | MEDICATED_PATCH | TRANSDERMAL | Status: DC
  Administered 2014-01-27 – 2014-02-02 (×2): 75 ug via TRANSDERMAL
  Filled 2014-01-27 (×5): qty 1

## 2014-01-27 NOTE — Progress Notes (Signed)
Called to room by RN, patient was coughing up thick yellow, pink tinged secretions through the vent tubing.  Patient SPO2-90%, deep suctioned with suction catheter and removed a plug.  Patient tolerated well and SPO2-98% post procedure.  BBS- diminished with rhonchi.  Changed ballard and HME due to copious secretions.  RT will continue to monitor.

## 2014-01-27 NOTE — Progress Notes (Signed)
PULMONARY / CRITICAL CARE MEDICINE   Name: Dale Taylor MRN: [id number] DOB: [birth year]    ADMISSION DATE:  01/07/2014 CONSULTATION DATE:  12/20  REFERRING MD :  FPTS  CHIEF COMPLAINT:  Respiratory failure    INITIAL PRESENTATION:  74 yo male CRI, prostate ca admitted with A on C renal failure, hyperkalemia, HCAP, UTI.    STUDIES/EVENTS:  12/20 Admitted to [place] wit dx of PNA 12/20 Transferred to ICU with respiratory acidosis, hypersomnolence. PCCM consultation and assumption of care. Treated initially with NPPV 12/21 PAF with RVR. Amiodarone started. Converted to NSR. Cards consult obtained. Recommended continuing current rx. No anticoagulation given high risk 12/22 Increased dyspnea. White out of R hemithorax on CXR. Intubated. Copious purulent secretions from ETT.  12/24 Extubated 12/25 recurrent mucus plugging 12/27 Bronchoscopy for removal of airway secretions (Sood). Bronchomalacia noted 12/29 Re-intubation  01/02 Family conference. Discussed options and pt wished to proceed with trach tube placement and likely LTACH 01/04 Hyper K+, brady, d/ced lopressor. Treated hyperK+. 01/05 Recurrent R lung collapse by CXR. Tracheostomy tube placed. 01/06 Increasing left lung interstitial opacities suspicious for edema, improved aeration RLL. No pnx. 01/06 Recurrent R lung collapse. Repeat bronchoscopy with removal of airway secretions. BAL specimen sent for cytology > atypical cells in background of extensive inflammation 01/07 Transferred to vent SDU bed 01/11 PEG placed 01/14 start PMV trials  STUDIES: 12/21 TTE >>   EF 40 to 79%, grade 1 diastolic dysfx 02/40 Renal US >>   No hydronephrosis 12/23 CT chest >>  extensive pleural thickening on R. R lung with reduced volume. Bilateral R>L AS dz  INDWELLING DEVICES:: ETT 12/22 >> 12/24, 12/29 >> 01/06 Trach tube 01/06 >>  PEG 1/11 >> RT PICC 1/14 >>  MICRO DATA: Resp (BAL) 01/06 >> MSSA  ANTIMICROBIALS:  Vanc 12/20 >> 12/22,  01/06 >> 1/10 Cefepime 12/20 >> 12/28, 01/06 >> 1/10 Ancef 1/11 >>1/15   SUBJECTIVE: Pt with ongoing secretions.  Pt states his goal is to be comfortable.  Not sure EOL issues have been fully explored here  VITAL SIGNS: Temp:  [97.9 F (36.6 C)-98.8 F (37.1 C)] 97.9 F (36.6 C) (01/17 0747) Pulse Rate:  [75-87] 81 (01/17 0747) Resp:  [21-28] 23 (01/17 0747) BP: (115-155)/(65-85) 155/84 mmHg (01/17 0747) SpO2:  [91 %-100 %] 100 % (01/17 0747) FiO2 (%):  [10 %-40 %] 40 % (01/17 0747) Weight:  [85.2 kg (187 lb 13.3 oz)] 85.2 kg (187 lb 13.3 oz) (01/17 0449)  Vent Mode:  [-] PRVC FiO2 (%):  [10 %-40 %] 40 % Set Rate:  [14 bmp] 14 bmp Vt Set:  [600 mL] 600 mL PEEP:  [8 cmH20] 8 cmH20 Plateau Pressure:  [1 cmH20-30 cmH20] 28 cmH20INTAKE / OUTPUT:  Intake/Output Summary (Last 24 hours) at 01/27/14 1014 Last data filed at 01/27/14 0700  Gross per 24 hour  Intake   1365 ml  Output   1200 ml  Net    165 ml    PHYSICAL EXAMINATION: General: anxious c/o L shoulder pain Neuro : follows commands HEENT: skin erosion at inferior border of trach with tan secretions Cardiovascular: regular Lungs: scattered rhonchi, improved BS on R .   Abdomen:  Obese, soft, +bs- active, suprapubic catheter, PEG c/d  Ext:  Warm, no edema, compression hose in place  LABS:  Recent Labs Lab 01/23/14 0605 01/25/14 0430 01/26/14 0449  NA 145 149* 148*  K 4.2 4.6 3.9  CL 111 112 116*  CO2 29 30 26   BUN  36* 31* 27*  CREATININE 1.27 0.99 0.80  GLUCOSE 221* 182* 163*    Recent Labs Lab 01/21/14 0524 01/22/14 0630 01/25/14 0430  HGB 8.3* 9.1* 9.2*  HCT 25.9* 29.7* 28.7*  WBC 12.6* 16.0* 18.0*  PLT 355 386 359    Dg Chest Port 1 View  01/26/2014   CLINICAL DATA:  Healthcare associated pneumonia. Acute respiratory failure with hypercapnia.  EXAM: PORTABLE CHEST - 1 VIEW  COMPARISON:  01/24/2014  FINDINGS: Endotracheal tube in right arm PICC line remain in place. Left jugular central venous  catheter has been removed since previous study. No pneumothorax visualized.  Increased complete opacification of right hemithorax is now seen consistent with increased right lung consolidation or collapse. Left lung hyperinflation again which shift of mediastinal structures to right. There has been mild improvement in diffuse left lung airspace opacity since prior study.  IMPRESSION: Worsening diffuse right lung consolidation or collapse.  Mild improvement in left lung airspace disease since prior exam.   Electronically Signed   By: Earle Gell M.D.   On: 01/26/2014 09:14     ASSESSMENT / PLAN:   Acute on chronic hypercarbic respiratory failure, in setting of HCAP and purulent bronchitis (MSSA).  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 Recurrent 1/16, better 1/17 with mucus plug removal Chronic R pleural dz. Tracheostomy status >> stoma erosion.  Note this pt states he wants to reconsider his approach and focus more on palliation.   Plan:   Continue vent support -  Continue vent bundle Continue nebulized BDs  Continue PT efforts   Needs  Palliative care consultation    Hx of CAD, HTN, HLD, CHF, PVD. PAF this hospitalization. Deemed poor candidate for chronic anticoagulation. Plan:  Continue ASA, lipitor Holding clopidogrel  Continue amiodarone >> changed to 200 mg daily on 1/15 Continue lopressor   Acute on chronic renal failure (baseline scr 1.35). Persistent Hypernatremia. BPH, prostate cancer. Plan:   Monitor BMET intermittently Monitor I/Os Correct electrolytes as indicated Cont  free water  Holding Proscar, flomax, tasigna for now  Tracheostomy associated dysphagia. Hx of GERD. S/p PEG 1/11. Plan:   SUP: Famotidine Resume Tube feeds.  Acute metabolic encephalopathy, resolved. Chronic pain. Chronic HA- seems to be occipital, radiating bilat to vertex. Severe deconditioning. P:   PRN dilaudid  PT eval Will need  vent SNF   ICU associated anemia without overt blood loss; superimposed on Anemia of chronic disease CML. Plan:  Lovenox for DVT prevention Monitor CBC intermittently  DM 2, controlled. Hx of gout. P:   Continue SSI  Obtain Pall Care consult.  Needs further EOL discussion. Pt may be amenable now    [place] WrightMD [place] 01/27/2014, 10:14 AM Beeper  [phone]  Cell  [phone]  If no response or cell goes to voicemail, call beeper [phone]  10:17 AM 01/27/2014

## 2014-01-28 DIAGNOSIS — R51 Headache: Secondary | ICD-10-CM

## 2014-01-28 DIAGNOSIS — R519 Headache, unspecified: Secondary | ICD-10-CM | POA: Insufficient documentation

## 2014-01-28 DIAGNOSIS — K117 Disturbances of salivary secretion: Secondary | ICD-10-CM | POA: Insufficient documentation

## 2014-01-28 DIAGNOSIS — Z515 Encounter for palliative care: Secondary | ICD-10-CM | POA: Insufficient documentation

## 2014-01-28 LAB — GLUCOSE, CAPILLARY
GLUCOSE-CAPILLARY: 114 mg/dL — AB (ref 70–99)
GLUCOSE-CAPILLARY: 135 mg/dL — AB (ref 70–99)
Glucose-Capillary: 170 mg/dL — ABNORMAL HIGH (ref 70–99)
Glucose-Capillary: 122 mg/dL — ABNORMAL HIGH (ref 70–99)
Glucose-Capillary: 114 mg/dL — ABNORMAL HIGH (ref 70–99)
Glucose-Capillary: 144 mg/dL — ABNORMAL HIGH (ref 70–99)

## 2014-01-28 MED ORDER — ACETAMINOPHEN 160 MG/5ML PO SOLN
650.0000 mg | Freq: Four times a day (QID) | ORAL | Status: DC
  Administered 2014-01-28 – 2014-02-04 (×28): 650 mg
  Filled 2014-01-28 (×28): qty 20.3

## 2014-01-28 MED ORDER — FREE WATER
300.0000 mL | Status: DC
  Administered 2014-01-28 – 2014-02-04 (×42): 300 mL

## 2014-01-28 NOTE — Progress Notes (Addendum)
Physical Therapy Treatment Patient Details Name: Dale Taylor MRN: [id number] DOB: 10/22/1940 Today's Date: 01/28/2014    History of Present Illness Dale Taylor is a 74 y.o. male presenting with acute renal failure according to patient's nursing facility. PMH is significant for hypertension, Type 2 diabetes, chronic diastolic heart failure, hyperkalemia, HLD, prostate cancer, iron deficiency anemia, A. fib with RVR. Intubated 12/22, Extubated 12/24. Re-intubated 12/29.    PT Comments    Patient tolerated EOB >10 minutes but with increased effort and fatigue. Attempted self supporting activities as well as trunk control, leaning and ther-ex for BLEs. Patient required assist for all activities this session. Attention appears to be very difficult to maintain, requiring max multi-modal cues to direct to task.  Overall, feel patient has digressed from previous session. Will continue to see and progress activity as tolerated.    Follow Up Recommendations  SNF     Equipment Recommendations  None recommended by PT    Recommendations for Other Services       Precautions / Restrictions Precautions Precautions: Fall Precaution Comments: vent, panda Restrictions Weight Bearing Restrictions: No    Mobility  Bed Mobility Overal bed mobility: Needs Assistance;+2 for physical assistance Bed Mobility: Supine to Sit;Sit to Supine Rolling: Max assist;Total assist;+2 for physical assistance   Supine to sit: Max assist;+2 for physical assistance Sit to supine: Total assist;+2 for physical assistance   General bed mobility comments: patient able to initiate LEs movement to EOB with assist and Cues, but required max to total assist to elevate trunk to EOB, use of chuck pad and helicopter technique  Transfers                    Ambulation/Gait                 Stairs            Wheelchair Mobility    Modified Rankin (Stroke Patients Only)       Balance    Sitting-balance support: Feet supported Sitting balance-Leahy Scale: Zero Sitting balance - Comments: patient with some trunk activation in sitting position but continues to require max assist with posterior and left lateral lean (different from previous session where patient had right lateral lean)                            Cognition Arousal/Alertness: Lethargic Behavior During Therapy: Flat affect Overall Cognitive Status: Difficult to assess Area of Impairment: Attention;Following commands;Problem solving   Current Attention Level: Focused Memory: Decreased short-term memory Following Commands: Follows one step commands consistently Safety/Judgement: Decreased awareness of safety;Decreased awareness of deficits Awareness: Intellectual Problem Solving: Slow processing;Decreased initiation;Difficulty sequencing;Requires verbal cues;Requires tactile cues General Comments: patient appears more lethargic and fatigued this session compared to prior sessions. Patient with increased weakness with all activities and requried maximal multi-modal cues to direct attention to task    Exercises General Exercises - Lower Extremity Ankle Circles/Pumps: PROM;AAROM;10 reps Long Arc Quad: PROM;AAROM;10 reps Hip Flexion/Marching: PROM;AAROM;10 reps    General Comments General comments (skin integrity, edema, etc.): patient tolerated EOB >10 minutes but with increased effort and fatigue. Attempted self supporting activities as well as trunk control, leaning and ther-ex for BLEs. Patient required assist for all activities this session. Attention appears to be very difficult to maintain, requiring max multi-modal cues to direct to task.       Pertinent Vitals/Pain      Home Living  Prior Function            PT Goals (current goals can now be found in the care plan section) Acute Rehab PT Goals PT Goal Formulation: With patient Time For Goal Achievement:  02/06/14 Potential to Achieve Goals: Fair Progress towards PT goals: Not progressing toward goals - comment    Frequency  Min 2X/week    PT Plan Current plan remains appropriate    Co-evaluation             End of Session Equipment Utilized During Treatment: Other (comment) (vent) Activity Tolerance: Patient limited by fatigue;Other (comment) Patient left: in bed;Other (comment)     Time: 1740-8144 PT Time Calculation (min) (ACUTE ONLY): 26 min  Charges:  $Therapeutic Exercise: 8-22 mins $Therapeutic Activity: 8-22 mins                    G CodesDuncan Dull 2014/02/06, 3:45 PM Alben Deeds, [place] DPT  [phone]

## 2014-01-28 NOTE — Progress Notes (Signed)
NUTRITION FOLLOW UP  Intervention:   Continue Vital AF 1.2 at 65 ml/h to provide 1872 kcals, 117 gm protein, 1265 ml free water daily.  Continue free water flushes.  Nutrition Dx:   Inadequate oral intake related to inability to eat as evidenced by NPO status, ongoing.  Goal:   Intake to meet >90% of estimated nutrition needs. Met  Monitor:   TF tolerance/adequacy, weight trend, labs, respiratory status.  Assessment:   74yo male with extensive PMH including hx CML, prostate ca, HTN, chronic dCHF, chronic suprapubic catheter, DM and CKD presented 12/20 from SNF with concern for "kidney problems". Admitted by [facility] teaching service with acute on chronic renal failure with hyperkalemia, HCAP +/- worsening R effusion and UTI. PCCM consulted r/t respiratory acidosis and increasing somnolence.   Tracheostomy 1/5.  PEG placement 1/11.  -Pt with Vital AF 1.2 infusing at goal rate of 65 ml/hr. Per RN, pt is tolerating TF with no residuals. Pt had 2 bowel movements today, not diarrhea.  -Palliative care consult for goals of care. Pt may be unable to be weaned off vent in future and is not a candidate for LTAC.   Patient is currently intubated on ventilator support MV: 13.1 L/min Temp (24hrs), Avg:98.2 F (36.8 C), Min:97.3 F (36.3 C), Max:99.1 F (37.3 C)  Propofol: none  Labs: Na elevated K WNL BUN elevated  Height: Ht Readings from Last 1 Encounters:  01/08/14 5' 11" (1.803 m)    Weight Status:   Wt Readings from Last 1 Encounters:  01/28/14 191 lb 5.8 oz (86.8 kg)   01/08/14 211 lb 6.7 oz (95.9 kg)   01/02/14 223 lb 12.3 oz (101.5 kg)  *weight trending down*  Body mass index is 26.7 kg/(m^2).  Re-estimated needs:  Kcal: 1757 Protein: 115-125 gm Fluid: 2 L  Skin: skin tear to buttocks, +1 generalized edema  Diet Order:     Intake/Output Summary (Last 24 hours) at 01/28/14 1355 Last data filed at 01/28/14 1300  Gross per 24 hour  Intake   2060 ml   Output    950 ml  Net   1110 ml    Last BM: 1/18   Labs:   Recent Labs Lab 01/23/14 0605 01/25/14 0430 01/26/14 0449  NA 145 149* 148*  K 4.2 4.6 3.9  CL 111 112 116*  CO2 _0 BUN 36* 31* 27*  CREATININE 1.27 0.99 0.80  CALCIUM 8.7 8.7 7.3*  GLUCOSE 221* 182* 163*    CBG (last 3)   Recent Labs  01/28/14 0421 01/28/14 0748 01/28/14 1136  GLUCAP 170* 135* 122*    Scheduled Meds: . acetaminophen (TYLENOL) oral liquid 160 mg/5 mL  650 mg Per Tube 4 times per day  . albuterol  2.5 mg Nebulization Q6H  . amiodarone  200 mg Oral Daily  . antiseptic oral rinse  7 mL Mouth Rinse QID  . aspirin  81 mg Per Tube Daily  . atorvastatin  20 mg Per Tube q1800  . chlorhexidine  15 mL Mouth Rinse BID  . enoxaparin (LOVENOX) injection  40 mg Subcutaneous Q24H  . famotidine  20 mg Per Tube BID  . fentaNYL  75 mcg Transdermal Q72H  . free water  300 mL Per Tube Q4H  . insulin aspart  0-15 Units Subcutaneous 6 times per day  . metoprolol  25 mg Per Tube BID  . sodium chloride  10-40 mL Intracatheter Q12H    Continuous Infusions: . feeding supplement (VITAL  AF 1.2 CAL) 1,000 mL (01/28/14 0058)     Laurette Schimke MS, RD, LDN

## 2014-01-28 NOTE — Clinical Social Work Note (Signed)
[facility] in [place] is able to make bed offer pending insurance authorization- [facility] is beginning to look at [employer] approval now.  CSW will continue to follow.  Domenica Reamer, [place] Social Worker [phone]

## 2014-01-28 NOTE — Progress Notes (Signed)
Placed passy muir in-line of the ventilator with Speech Therapy, RN, NP, and lead therapist at bedside.  Titrated Vt to patient comfort following Speech protocol for use of in-line passy muir.  Patient tolerated well and maintained oxygen saturation and stable hemodynamics.  Returned patient to previous ventilator settings once Speech was finished with their session.

## 2014-01-28 NOTE — Progress Notes (Signed)
PULMONARY / CRITICAL CARE MEDICINE   Name: Dale Taylor MRN: [id number] DOB: [birth year]    ADMISSION DATE:  12/26/2013 CONSULTATION DATE:  12/20  REFERRING MD :  FPTS  CHIEF COMPLAINT:  Respiratory failure    INITIAL PRESENTATION:  74 yo male CRI, prostate ca admitted with A on C renal failure, hyperkalemia, HCAP, UTI.    STUDIES/EVENTS:  12/20 Admitted to [facility] wit dx of PNA 12/20 Transferred to ICU with respiratory acidosis, hypersomnolence. PCCM consultation and assumption of care. Treated initially with NPPV 12/21 PAF with RVR. Amiodarone started. Converted to NSR. Cards consult obtained. Recommended continuing current rx. No anticoagulation given high risk 12/22 Increased dyspnea. White out of R hemithorax on CXR. Intubated. Copious purulent secretions from ETT.  12/24 Extubated 12/25 recurrent mucus plugging 12/27 Bronchoscopy for removal of airway secretions (Sood). Bronchomalacia noted 12/29 Re-intubation  01/02 Family conference. Discussed options and pt wished to proceed with trach tube placement and likely LTACH 01/04 Hyper K+, brady, d/ced lopressor. Treated hyperK+. 01/05 Recurrent R lung collapse by CXR. Tracheostomy tube placed. 01/06 Increasing left lung interstitial opacities suspicious for edema, improved aeration RLL. No pnx. 01/06 Recurrent R lung collapse. Repeat bronchoscopy with removal of airway secretions. BAL specimen sent for cytology > atypical cells in background of extensive inflammation 01/07 Transferred to vent SDU bed 01/11 PEG placed 01/14 start PMV trials  STUDIES: 12/21 TTE >>   EF 40 to 27%, grade 1 diastolic dysfx 74/12 Renal US >>   No hydronephrosis 12/23 CT chest >>  extensive pleural thickening on R. R lung with reduced volume. Bilateral R>L AS dz  INDWELLING DEVICES:: ETT 12/22 >> 12/24, 12/29 >> 01/06 Trach tube 01/06 >>  PEG 1/11 >> RT PICC 1/14 >>  MICRO DATA: Resp (BAL) 01/06 >> MSSA Sputum 1/18>>>  ANTIMICROBIALS:  Vanc  12/20 >> 12/22, 01/06 >> 1/10 Cefepime 12/20 >> 12/28, 01/06 >> 1/10 Ancef 1/11 >>1/15   SUBJECTIVE: No distress. Didn't tolerate in-line as well today   VITAL SIGNS: Temp:  [97.3 F (36.3 C)-98.4 F (36.9 C)] 98 F (36.7 C) (01/18 0418) Pulse Rate:  [70-93] 81 (01/18 0754) Resp:  [19-32] 27 (01/18 0754) BP: (134-152)/(62-70) 134/69 mmHg (01/18 0418) SpO2:  [95 %-100 %] 97 % (01/18 0754) FiO2 (%):  [40 %] 40 % (01/18 0754) Weight:  [86.8 kg (191 lb 5.8 oz)] 86.8 kg (191 lb 5.8 oz) (01/18 0418)  Vent Mode:  [-] PRVC FiO2 (%):  [40 %] 40 % Set Rate:  [14 bmp] 14 bmp Vt Set:  [600 mL] 600 mL PEEP:  [8 cmH20] 8 cmH20 Plateau Pressure:  [21 cmH20-30 cmH20] 24 cmH20   INTAKE / OUTPUT:  Intake/Output Summary (Last 24 hours) at 01/28/14 1039 Last data filed at 01/28/14 0600  Gross per 24 hour  Intake   1300 ml  Output   1050 ml  Net    250 ml    PHYSICAL EXAMINATION: General: anxious c/o L shoulder pain Neuro : follows commands HEENT: skin erosion at inferior border of trach with tan secretions Cardiovascular: regular Lungs: scattered rhonchi, decreased on right thick secretions  Abdomen:  Obese, soft, +bs- active, suprapubic catheter, PEG c/d  Ext:  Warm, no edema, compression hose in place  LABS:  Recent Labs Lab 01/23/14 0605 01/25/14 0430 01/26/14 0449  NA 145 149* 148*  K 4.2 4.6 3.9  CL 111 112 116*  CO2 29 30 26   BUN 36* 31* 27*  CREATININE 1.27 0.99 0.80  GLUCOSE 221* 182*  163*    Recent Labs Lab 01/22/14 0630 01/25/14 0430  HGB 9.1* 9.2*  HCT 29.7* 28.7*  WBC 16.0* 18.0*  PLT 386 359    No results found.   ASSESSMENT / PLAN:   Acute on chronic hypercarbic respiratory failure, in setting of HCAP and purulent bronchitis (MSSA).  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 Recurrent 1/16, better 1/17 with mucus plug removal Chronic R pleural dz. Tracheostomy status >> stoma erosion. Possible  tracheobronchitis 1/18 > per Dr Bettina Gavia note, there has been some discussion about his goals of care. Did not address this today 1/18 as his symptoms burden was worse d/t increased secretions.   Plan:   Continue vent support  Continue vent bundle Continue nebulized BDs  Continue PT efforts Continue in-line speaking valve trials Will d/w him goal of care further.. Suspect his ability to tolerated in-line speaking and possibly eating would change his out-looks some, however vent-SNF    Hx of CAD, HTN, HLD, CHF, PVD. PAF this hospitalization. Deemed poor candidate for chronic anticoagulation. Plan:  Continue ASA, lipitor Holding clopidogrel  Continue amiodarone >> changed to 200 mg daily on 1/15 Continue lopressor   Acute on chronic renal failure (baseline scr 1.35). Persistent Hypernatremia. BPH, prostate cancer. Plan:   Monitor BMET intermittently Monitor I/Os Correct electrolytes as indicated Cont  free water  Holding Proscar, flomax, tasigna for now  Tracheostomy associated dysphagia. Hx of GERD. S/p PEG 1/11. Plan:   SUP: Famotidine Resume Tube feeds.  Acute metabolic encephalopathy, resolved. Chronic pain. Chronic HA- seems to be occipital, radiating bilat to vertex. Severe deconditioning. P:   PRN dilaudid; add scheduled APAP  PT eval Will need vent SNF   ICU associated anemia without overt blood loss; superimposed on Anemia of chronic disease CML. Plan:  Lovenox for DVT prevention Monitor CBC intermittently  DM 2, controlled. Hx of gout. P:   Continue SSI  Discussion  Will cont to work on weaning and transitioning to in-line talking valve. We do need to have a discussion with him about goals of care and if all we are doing with him is in line with what he wants. WIll try to sort this out over this week  10:39 AM 01/28/2014  Attending Note:  I have examined patient, reviewed labs, studies and notes. I have discussed the case with [name], and I  agree with the data and plans as amended above.   Baltazar Apo, MD, PhD 01/29/2014, 12:25 PM  Pulmonary and Critical Care [phone] or if no answer [phone]

## 2014-01-28 NOTE — Progress Notes (Signed)
Patient was coughing up copious secretions.  Deep sxn with sxn catheter. Blood tinged secretions.  Patient is resting comfortably HR-90, SPO2 95%, and RR-26.

## 2014-01-28 NOTE — Progress Notes (Signed)
Speech Language Pathology Treatment: Nada Boozer Speaking valve  Patient Details Name: Dale Taylor MRN: [id number] DOB: 13-Jun-1940 Today's Date: 01/28/2014 Time: 3832-9191 SLP Time Calculation (min) (ACUTE ONLY): 30 min  Assessment / Plan / Recommendation Clinical Impression  Treatment focused on in-line PMV to facilitate use of upper airway for communication and secretion management. SLP and RT collaborated to manage valve placement.  RT made adjustements to vent settings initially consistent with those made during previous session; Vt=1033m, PEEP=3, RR=14 (See RT note for details re: vent setting changes and volume compensations).  Pt tolerated cuff deflation with expectorations of copious secretions orally and again met assessment criteria for valve use (40-50% loss of exhaled Vt s/p cuff deflation and desired drop in peak pressure indicative of patent upper airway.)   SLP placed valve and cued for phonate at the conversation level. With valve placed in line and vent adjustments made, patient able to achieved moderately hoarse phonation, with decreased pitch and decreased breath support for fluid and intelligible verbal output as compared to initial evaluation where he verbalized comfortably at the conversation level. Verbalizations today were limited to short phrases which were approximately 50% intelligible with clinician providing verbal cueing for repetition of information. Addtional vent setting adjustements made by RT including increased PEEP=5 and PS without significant improvements in overall function. Pateint verbalizing lethargy and presented with increased secretions, thick in nature as compared to evaluation. Increased use of accessory muscles noted with valve use indicative of fatigue.  NP present and questioning infection, culture pending. Valve removed to allow for rest, cuff was reinflated and baseline vent settings were resumed per RT.  Recommend continued use of  f in-line PMV use with  SLP and RT present; increasing frequency and length of use over time as patient tolerates. Will consider assessment of swallow when patient more consistently comfortable with valve use.    HPI HPI: 74y.o. male admitted with respiratory failure; tracheostomy, acute on chronic renal failure, right lung collapse, HCAP and UTI. The patient has a history of prostate cancer, PAF, CAD.  Intubated 12/20-12/24; reintubated12/29; trach 1/5; continues on vent. Orders received for inline PMV.  Pt meets criteria for inline valve trials.       Pertinent Vitals Pain Assessment: No/denies pain  SLP Plan  Continue with current plan of care           Patient may use Passy-Muir Speech Valve: with SLP only       Plan: Continue with current plan of care    [place]um [phone]  Azaylia Fong [place] 01/28/2014, 11:20 AM

## 2014-01-28 NOTE — Consult Note (Signed)
Patient NV:VYXAJ Dale Taylor      DOB: May 30, 1940      LUN:[id number]     Consult Note from the Palliative Medicine Team at [facility] Requested by: Dr Joya Gaskins     PCP: PROVIDER NOT [facility] Reason for Consultation: GOC, headaches/chronic pain     Phone Number:None  Assessment/Recommendations: 74 yo male with PMHx of CKD, CAD, prostate CA, hip fracture who presented on 12/20 with AKI, PNA, acute resp failure. Long complicated course with recurrent intubation and eventual trach/peg placement in setting of bronchomalacia and RLL collapse.  Palliative consulted for difficult to control headaches and larger goals of care.    1.  Code Status: Full  2. GOC:  Appreciate consultation. Long hospitalization with trach/PEG due to inability to successfully wean from vent in setting of bronchomalacia.  Apparently not a candidate for LTAC and progressing toward SNF placement with vent which may limit options to out of area facilities.  Spoke with family briefly today and also tried to communicate with Francee Piccolo.  I attempted to use pen/paper, simplified questions but difficult to have much conversation with him as I can't assess how much he understands of situation.  I certainly can ascertain that trach/vent is frustrating to him, but I am also to get his understanding that he would pass away without this. In talking with him and family, it does not sound like he has a great idea of what long-term will look like for him. In speaking with his daughter Cotina today, she had some questions as well about what the long term will look like for him, specifically need for ongoing ventillation.  We talked a little more specifically about concerns with bronchomalacia and recurrent right lung collapse and even being prepared that he may be unable to liberate from vent completely.  She feels like overall, her father is doing much better with all this since trach placement and being able to speak.  Suspect, more specific  goals to develop based off how next weeks to months go.  I will continue to follow and work on establishing goals while here, but I think he would benefit from these ongoing discussions at long term care facility he goes to. Not sure if there will be outpatient palliative care services available, but if so would recommend their involvement. He may very well get to a point where QOL becomes unacceptable to him but it may take trial of continued therapy to see if that is case.  Discussed with ICU team who will also try to address as able. If he can get some time with speaking valve, may be able to address goals more clearly as well.    I will be off service tomorrow, but plan on following up with patient/family on Wed.       3. Symptom Management:   1. Headaches, Chronic Pain: Not responded great to opioids. Family believes they started after trach, but he has had chronic back/hip pain since hip fracture over 1 year ago (took pain pills chronically).  Suspect this may be MSK in etiology.  Started on [place] 28mcg patch yesterday which is pretty good dose of long acting medicine. Patch wont be reaching peak effect until later today tomorrow. Would try scheduling tylenol. I will simplify PRN meds and re-assess on Wed.   2. Increased Secretions: Problematic for him this morning even after I did suctioning. Things like scopolamine patch could be considered but can have effect on cognition. I think robinul  may make him more uncomfortable.    4. Psychosocial/Spiritual: Married, 7 children. Niece is Network engineer on ward.  Had hip fracture over 1 year ago and dependent on cain for ambulation but was otherwise fairly functional prior to this event.      Brief HPI: 74 yo male with PMHx of CKD, CAD, prostate CA, hip fracture admitted on 12/20 with described AKI at nursing facility.  He was somnolent on admission with concerns for PNA.  He was quickly transferred to ICU with hypercapnic resp failure requiring NIPPV.  Developed Afib with RVR and worsening resp failure leading to intubation on 12/22. Able to be extubated on 12/24 but had mucous plugging which lead to bronchoscopy on 12/27 with bronchomalacia noted at that time. He rquired re-intubation on 12/29 with recurrent R lung collapse. With his inability to liberate from vent, and after discussion with family, decision made to proceed with tracheostomy on 01/15/14.  Repeat bronch on 1/6 found MSSA on BAL. He had PEG placed on 01/21/14.  In addition to above he has had difficult to control headaches requiring high doses of pain medications and fentanyl patch placement yesterday.  I attempted to speak with Francee Piccolo, but difficult to have meaningful conversation with vent and his difficulty controlling secretions. He specifically denied headache at time of my visit.  Unable to discuss goals of care with him because of this.  Other than secretions and discomfort from vent, I am not able to illicit any other symptomatic complaints.     PMH:  Past Medical History  Diagnosis Date  . Hypertension   . Hypercholesterolemia   . GERD (gastroesophageal reflux disease)   . Dyspnea on exertion   . Claudication in peripheral vascular disease   . PVD (peripheral vascular disease)   . S/P renal artery angioplasty 2009--- W/ STENT X1  TO LEFT COMMON ILIAC    . ED (erectile dysfunction)   . History of BPH   . Frequency of urination   . Urgency of urination   . Nocturia   . Chronic gouty arthritis FEET  . Diabetes mellitus     type II-- ORAL MED  . Prostate cancer SCHEDULED FOR RADIOACTIVE SEED IMPLANTS OF PROSTATE  05-18-2011    FOLLOWED BY DR [facility] AND DR Valere Dross  . CML (chronic myelocytic leukemia) FOLLOWED BY DR LEWIS ([place])    TAKES [facility] PO MED FOR TX--  PT STATES STABLE  NO SYMPTOMS  . Iron deficiency anemia   . Chronic renal insufficiency   . PAD (peripheral artery disease)   . Cardiovascular disease with arteriosclerosis   . Blood transfusion  LAST TIME NOV 2012  --  X2  UNITS  (FOR ANEMIA AND THROMBOCYTOPENIA  . Leukocytosis   . Peripheral edema   . History of thrombocytopenia   . Heart murmur   . CHF (congestive heart failure) ADMISSION NOV 2012  [facility])  REQUESTED RECORDS    DIATOSLIC CHF WITH LEFT VENTRICULAR IMPAIRED RELAXATION-----   CARDIOLOGIST----  DR Dallie Piles  . Hypertension   . Arthritis   . History of angina   . Acute renal failure Admitted to [facility] 06/06/12 with hip fracture; creatinine 4.3, K 7.8. Aldactone and lisinopril stopped     PSH: Past Surgical History  Procedure Laterality Date  . Lung lobectomy  AGE 78 (APPROX.)    RIGHT LOWER LOBECTOMY 20 YEARS AGO, NOT CANCER (PROBLEM HEMORRHAGE)  . Hemorrhoid surgery    . Transthoracic echocardiogram  12-05-2010  [facility])  LEFT VENTRICULAR HYPERTROPHY / PRESERVED SYSTOLIC FUNCTION/ EF 69-48%/  IMPAIRED LEFT VENTRICULAR RELAXATION WITH LEFT ATRIAL ENLARGEMENT/ MILD MITRAL AND TRICUSPID REGURG.  . Cataract extraction w/ intraocular lens  implant, bilateral  2012  . Aortogram/ angioplasty and stenting of left common iliac artery  04-07-2007   DR FIELDS    LEFT COMMON ILIAC STENOSIS 80%, STENTED/ BILATERAL OCCLUSION ANTERIOR TIBIAL ARTERY/ BILATERAL INCOMPLETE PLANTAR ARCH FILLED BY PERONEAL AND POSTERIOR TIBIAL ARTERY/ DIFFUSE BUT PATENT SUPERFICIIAL FEMORAL ARTERIES BILATERLLY  . Cardiovascular stress test  05-19-2011  DR ROBERT KRASOWSKI    NO EVIDENCE OF ISCHEMIA/ NORMAL LVSF/ EF 55%  . Radioactive seed implant  07/21/2011    Procedure: RADIOACTIVE SEED IMPLANT;  Surgeon: Joie Bimler, MD;  Location: [facility];  Service: Urology;  Laterality: N/A;    72   seeds implanted  . Cystoscopy  07/21/2011    Procedure: CYSTOSCOPY;  Surgeon: Joie Bimler, MD;  [facility]: [facility];  Service: Urology;  Laterality: N/A;  no seeds found in bladder  . Tracheostomy  01/15/14 feinstein    feinstein   I have reviewed the  FH and SH and  If appropriate update it with new information. No Known Allergies Scheduled Meds: . acetaminophen (TYLENOL) oral liquid 160 mg/5 mL  650 mg Per Tube 4 times per day  . albuterol  2.5 mg Nebulization Q6H  . amiodarone  200 mg Oral Daily  . antiseptic oral rinse  7 mL Mouth Rinse QID  . aspirin  81 mg Per Tube Daily  . atorvastatin  20 mg Per Tube q1800  . chlorhexidine  15 mL Mouth Rinse BID  . enoxaparin (LOVENOX) injection  40 mg Subcutaneous Q24H  . famotidine  20 mg Per Tube BID  . fentaNYL  75 mcg Transdermal Q72H  . free water  300 mL Per Tube Q4H  . insulin aspart  0-15 Units Subcutaneous 6 times per day  . metoprolol  25 mg Per Tube BID  . sodium chloride  10-40 mL Intracatheter Q12H   Continuous Infusions: . feeding supplement (VITAL AF 1.2 CAL) 1,000 mL (01/28/14 0058)   PRN Meds:.acetaminophen, albuterol, butalbital-acetaminophen-caffeine, docusate, HYDROmorphone, oxyCODONE, sodium chloride, traMADol, traZODone    BP 134/69 mmHg  Pulse 75  Temp(Src) 98 F (36.7 C) (Axillary)  Resp 18  Ht 5\' 11"  (1.803 m)  Wt 86.8 kg (191 lb 5.8 oz)  BMI 26.70 kg/m2  SpO2 97%   PPS: 30   Intake/Output Summary (Last 24 hours) at 01/28/14 1126 Last data filed at 01/28/14 1100  Gross per 24 hour  Intake   1760 ml  Output   1050 ml  Net    710 ml   Physical Exam:  General: Alert, develops intermittent distress from secretions HEENT:  [place], sclera anicteric, trach/vent Neck: supple Chest:   Scattered coarse sounds   NIO:EVOJJKK rate Abdomen:+PEG, soft, NT Ext: no edema Neuro: follows commands appropriately, difficulty communicating by writing and other nonverbal means  Labs: CBC    Component Value Date/Time   WBC 18.0* 01/25/2014 0430   RBC 3.06* 01/25/2014 0430   RBC 2.39* 06/30/2012 1830   HGB 9.2* 01/25/2014 0430   HCT 28.7* 01/25/2014 0430   PLT 359 01/25/2014 0430   MCV 93.8 01/25/2014 0430   MCH 30.1 01/25/2014 0430   MCHC 32.1 01/25/2014 0430    RDW 16.3* 01/25/2014 0430   LYMPHSABS 1.1 01/21/2014 0524   MONOABS 1.2* 01/21/2014 0524   EOSABS 0.2 01/21/2014 0524  BASOSABS 0.1 01/21/2014 0524    BMET    Component Value Date/Time   NA 148* 01/26/2014 0449   K 3.9 01/26/2014 0449   CL 116* 01/26/2014 0449   CO2 26 01/26/2014 0449   GLUCOSE 163* 01/26/2014 0449   BUN 27* 01/26/2014 0449   CREATININE 0.80 01/26/2014 0449   CALCIUM 7.3* 01/26/2014 0449   GFRNONAA 87* 01/26/2014 0449   GFRAA >90 01/26/2014 0449    CMP     Component Value Date/Time   NA 148* 01/26/2014 0449   K 3.9 01/26/2014 0449   CL 116* 01/26/2014 0449   CO2 26 01/26/2014 0449   GLUCOSE 163* 01/26/2014 0449   BUN 27* 01/26/2014 0449   CREATININE 0.80 01/26/2014 0449   CALCIUM 7.3* 01/26/2014 0449   PROT 4.8* 01/16/2014 0500   ALBUMIN 2.4* 01/16/2014 0500   AST 32 01/16/2014 0500   ALT 37 01/16/2014 0500   ALKPHOS 81 01/16/2014 0500   BILITOT 0.6 01/16/2014 0500   GFRNONAA 87* 01/26/2014 0449   GFRAA >90 01/26/2014 0449   1/7 CT Abd/Pelvis IMPRESSION: 1. No clinically significant anatomic abnormality that would preclude gastric tube placement. 2. Relatively high position of the transverse colon which is directly anterior to the stomach. At the time of tube placement, if the transverse colon is not easily identified by the presence of intra colonic gas, administration of rectal contrast may be warranted. 3. Improving patchy airspace disease in the left upper and lower lobes compared to 01/02/2014. 4. Decreasing partially loculated left pleural effusion compared to 01/02/2014. 5. Atherosclerotic vascular calcifications. 6. Essentially stable cavitary lesion in the left lower lobe. 7. The intracardiac blood pool is hypodense relative to the adjacent myocardium consistent with anemia. Signed,  1/16 CXR IMPRESSION: Worsening diffuse right lung consolidation or collapse.  Mild improvement in left lung airspace disease since prior  exam.   Total Time: 70 minutes Greater than 50%  of this time was spent counseling and coordinating care related to the above assessment and plan.  Doran Clay D.O. Palliative Medicine Team at [facility]  Pager: [phone] Team Phone: [phone]

## 2014-01-29 LAB — GLUCOSE, CAPILLARY
GLUCOSE-CAPILLARY: 100 mg/dL — AB (ref 70–99)
GLUCOSE-CAPILLARY: 104 mg/dL — AB (ref 70–99)
GLUCOSE-CAPILLARY: 129 mg/dL — AB (ref 70–99)
GLUCOSE-CAPILLARY: 131 mg/dL — AB (ref 70–99)
GLUCOSE-CAPILLARY: 152 mg/dL — AB (ref 70–99)
Glucose-Capillary: 145 mg/dL — ABNORMAL HIGH (ref 70–99)
Glucose-Capillary: 135 mg/dL — ABNORMAL HIGH (ref 70–99)

## 2014-01-29 LAB — BASIC METABOLIC PANEL
ANION GAP: 9 (ref 5–15)
BUN: 48 mg/dL — ABNORMAL HIGH (ref 6–23)
CO2: 27 mmol/L (ref 19–32)
Calcium: 8.6 mg/dL (ref 8.4–10.5)
Chloride: 102 mEq/L (ref 96–112)
Creatinine, Ser: 1.05 mg/dL (ref 0.50–1.35)
GFR calc Af Amer: 79 mL/min — ABNORMAL LOW (ref >90)
GFR, EST NON AFRICAN AMERICAN: 68 mL/min — AB (ref >90)
GLUCOSE: 145 mg/dL — AB (ref 70–99)
POTASSIUM: 5.8 mmol/L — AB (ref 3.5–5.1)
SODIUM: 138 mmol/L (ref 135–145)

## 2014-01-29 NOTE — Progress Notes (Signed)
Speech Language Pathology Treatment: Dale Taylor Speaking valve  Patient Details Name: Dale Taylor MRN: [id number] DOB: [birth year] Today's Date: 01/29/2014 Time: 6015-6153 SLP Time Calculation (min) (ACUTE ONLY): 1425 min  Assessment / Plan / Recommendation Clinical Impression  Treatment focused on in-line PMV to facilitate use of upper airway for communication and secretion management. SLP and RT collaborated to manage valve placement. Pt tolerated cuff deflation - did not require oral suctioning as he has during prior trials.  He met criteria for valve use (40-50% loss of exhaled Vt s/p cuff deflation and desired drop in peak pressure indicative of patent upper airway.) RT made adjustments to vent settings to compensate for volume/pressure loss with valve in place. (See RT note for details re: vent setting changes and volume compensations). Patient achieved moderately hoarse phonation, with decreased pitch and but improved breath support from day prior.  Speech volume and intelligibility were improved to >75% clarity.  Pt communicated with sons and sister, but c/o discomfort and requested that valve be removed.  Cuff was reinflated and baseline vent settings were resumed per RT. Pt still achieving voice despite cuff inflation - RT followed-up to ensure no leak in cuff. Recommend continued use of in-line PMV use with SLP and RT present; increasing frequency and length of use over time as patient tolerates. Will consider assessment of swallow when patient more consistently comfortable with valve use.    HPI HPI: 75 y.o. male admitted with respiratory failure; tracheostomy, acute on chronic renal failure, right lung collapse, HCAP and UTI. The patient has a history of prostate cancer, PAF, CAD.  Intubated 12/20-12/24; reintubated12/29; trach 1/5; continues on vent. Orders received for inline PMV.  Pt meets criteria for inline valve trials.       Pertinent Vitals    SLP Plan  Continue with current  plan of care    Recommendations        Patient may use Passy-Muir Speech Valve: with SLP only       Plan: Continue with current plan of care   Dale Taylor L. Tivis Ringer, [place] CCC/SLP Pager [phone]      Dale Taylor 01/29/2014, 3:38 PM

## 2014-01-29 NOTE — Progress Notes (Signed)
RT Note: RT assisted speech therapy with using the PMSV with patient while on the ventilator. Patient met all criteria for the PMSV trial and he was excited to speak with his family. During the trial, ventilator changes were made to compensate for the patients air leak as a result of needing the cuff to be deflated. The following vent changes were made to achieve optimal tolerance/ventilation: PRVC, Vt- 950 which was titrated up in increments of 30m at a time, Peep +3, Fio2 40%, RR 14, I time 0.9, and trigger of -2. He had been placed on similar setting on his previous trial and tolerated it well. Patient tolerated the trial for a short period of time then requested that the PMSV be taken off. He was then placed back on his rest mode settings of: PRVC, Vt- 6032m RR 14, PEEP +8, Fio2 40%, I time of 0.8, and trigger 2 and his cuff was re inflated as well. Patient is stable on vent and is in no acute distress. Rt will continue to monitor patient.

## 2014-01-29 NOTE — Progress Notes (Signed)
PULMONARY / CRITICAL CARE MEDICINE   Name: Dale Taylor MRN: [id number] DOB: 01/19/1940    ADMISSION DATE:  12/29/2013 CONSULTATION DATE:  12/20  REFERRING MD :  FPTS  CHIEF COMPLAINT:  Respiratory failure    INITIAL PRESENTATION:  74 yo male CRI, prostate ca admitted with A on C renal failure, hyperkalemia, HCAP, UTI.    STUDIES/EVENTS:  12/20 Admitted to [facility] wit dx of PNA 12/20 Transferred to ICU with respiratory acidosis, hypersomnolence. PCCM consultation and assumption of care. Treated initially with NPPV 12/21 PAF with RVR. Amiodarone started. Converted to NSR. Cards consult obtained. Recommended continuing current rx. No anticoagulation given high risk 12/22 Increased dyspnea. White out of R hemithorax on CXR. Intubated. Copious purulent secretions from ETT.  12/24 Extubated 12/25 recurrent mucus plugging 12/27 Bronchoscopy for removal of airway secretions ([place]). Bronchomalacia noted 12/29 Re-intubation  01/02 Family conference. Discussed options and pt wished to proceed with trach tube placement and likely LTACH 01/04 Hyper K+, brady, d/ced lopressor. Treated hyperK+. 01/05 Recurrent R lung collapse by CXR. Tracheostomy tube placed. 01/06 Increasing left lung interstitial opacities suspicious for edema, improved aeration RLL. No pnx. 01/06 Recurrent R lung collapse. Repeat bronchoscopy with removal of airway secretions. BAL specimen sent for cytology > atypical cells in background of extensive inflammation 01/07 Transferred to vent SDU bed 01/11 PEG placed 01/14 start PMV trials 1/18 minimally tolerates inline speaking valve trials.  STUDIES: 12/21 TTE >>   EF 40 to 46%, grade 1 diastolic dysfx 50/35 Renal US >>   No hydronephrosis 12/23 CT chest >>  extensive pleural thickening on R. R lung with reduced volume. Bilateral R>L AS dz  INDWELLING DEVICES:: ETT 12/22 >> 12/24, 12/29 >> 01/06 Trach tube 01/06 >>  PEG 1/11 >> RT PICC 1/14 >>  MICRO DATA: Resp (BAL)  01/06 >> MSSA Sputum 1/18>>>  ANTIMICROBIALS:  Vanc 12/20 >> 12/22, 01/06 >> 1/10 Cefepime 12/20 >> 12/28, 01/06 >> 1/10 Ancef 1/11 >>1/15   SUBJECTIVE: Reports pain well controlled today, since Duragesic started. PRN use has decreased significantly. Patient is optimistic about use of in-line speaking valve.  VITAL SIGNS: Temp:  [97.4 F (36.3 C)-99.1 F (37.3 C)] 98.2 F (36.8 C) (01/19 0808) Pulse Rate:  [61-88] 80 (01/19 1002) Resp:  [9-28] 10 (01/19 0808) BP: (107-153)/(53-71) 141/59 mmHg (01/19 1002) SpO2:  [96 %-100 %] 99 % (01/19 0808) FiO2 (%):  [40 %] 40 % (01/19 0730) Weight:  [88.6 kg (195 lb 5.2 oz)] 88.6 kg (195 lb 5.2 oz) (01/19 0500)  Vent Mode:  [-] PRVC FiO2 (%):  [40 %] 40 % Set Rate:  [14 bmp] 14 bmp Vt Set:  [600 mL] 600 mL PEEP:  [8 cmH20] 8 cmH20 Plateau Pressure:  [22 cmH20-28 cmH20] 28 cmH20   INTAKE / OUTPUT:  Intake/Output Summary (Last 24 hours) at 01/29/14 1048 Last data filed at 01/29/14 0700  Gross per 24 hour  Intake   3185 ml  Output    850 ml  Net   2335 ml    PHYSICAL EXAMINATION: General: Calm, comfortable in bed on vent Neuro : Follows commands HEENT: Skin erosion at inferior border of trach with tan secretions Cardiovascular: Regular Lungs: scattered rhonchi, moderate tracheal secretions in vent tubing Abdomen:  Obese, soft, +bs- active, suprapubic catheter, PEG c/d  Ext:  Warm, no edema, compression hose in place  LABS:  Recent Labs Lab 01/25/14 0430 01/26/14 0449 01/29/14 0630  NA 149* 148* 138  K 4.6 3.9 5.8*  CL 112  116* 102  CO2 30 26 27   BUN 31* 27* 48*  CREATININE 0.99 0.80 1.05  GLUCOSE 182* 163* 145*    Recent Labs Lab 01/25/14 0430  HGB 9.2*  HCT 28.7*  WBC 18.0*  PLT 359    No results found.   ASSESSMENT / PLAN:   Acute on chronic hypercarbic respiratory failure, in setting of HCAP and purulent bronchitis (MSSA).  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 Recurrent 1/16, better 1/17 with mucus plug removal Chronic R pleural dz. Tracheostomy status >> stoma erosion. Possible tracheobronchitis 1/18 > per Dr Bettina Gavia note, there has been some discussion about his goals of care. Palliative care has since become involved, and will continue to follow.   Plan:   Continue vent support  Continue vent bundle Continue nebulized BDs  Continue PT efforts Continue in-line speaking valve trials Palliative care consulted 1/18, following  Hx of CAD, HTN, HLD, CHF, PVD. PAF this hospitalization. Deemed poor candidate for chronic anticoagulation. Plan:  Continue ASA, lipitor Holding clopidogrel  Continue amiodarone >> changed to 200 mg daily on 1/15 Continue lopressor   Acute on chronic renal failure (baseline scr 1.35). Persistent Hypernatremia. BPH, prostate cancer. Plan:   Monitor BMET intermittently Monitor I/Os Correct electrolytes as indicated Cont  free water  Holding Proscar, flomax, tasigna for now  Tracheostomy associated dysphagia. Hx of GERD. S/p PEG 1/11. Plan:   SUP: Famotidine Resume Tube feeds.  Acute metabolic encephalopathy, resolved. Chronic pain > improved 1/19 with addition of fentanyl patch Chronic HA- seems to be occipital, radiating bilat to vertex. Severe deconditioning. 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 Plan:  Lovenox for DVT prevention Monitor CBC intermittently  DM 2, controlled. Hx of gout. P:   Continue SSI  Discussion  Will cont to work on weaning and transitioning to in-line talking valve.  [name], AGACNP-BC [place] Pulmonology/Critical Care Pager [phone] or [phone]   Awaiting placement in [place], [place].  Hold weaning.  Appears stable on vent.  Will check with CSW for bed availability.  Patient seen and examined, agree  with above note.  I dictated the care and orders written for this patient under my direction.  Rush Farmer, MD [phone]

## 2014-01-29 NOTE — Clinical Social Work Note (Signed)
[facility] informed CSW that patients insurance benefits will not cover patients needs and they are unable to extend bed offer at this time.  CSW spoke with patients daughter, Philis Nettle, to discuss other options.  Patients daughter is not agreeable to [place] at this time without discussing with patients wife and other relatives. Patients dtr is interested in home health discussion at this time to see if it is possible to take pt home- CSW left message for [facility] to discuss home health with pts family.  Patients daughter to discuss with pt spouse and inform CSW of decision this afternoon.  CSW will continue to follow.  Domenica Reamer, [place] Social Worker [phone]

## 2014-01-30 ENCOUNTER — Inpatient Hospital Stay (HOSPITAL_COMMUNITY): Payer: Medicare HMO

## 2014-01-30 DIAGNOSIS — J9811 Atelectasis: Secondary | ICD-10-CM | POA: Insufficient documentation

## 2014-01-30 LAB — BASIC METABOLIC PANEL
Anion gap: 5 (ref 5–15)
Anion gap: 9 (ref 5–15)
BUN: 57 mg/dL — ABNORMAL HIGH (ref 6–23)
BUN: 57 mg/dL — ABNORMAL HIGH (ref 6–23)
CALCIUM: 8.8 mg/dL (ref 8.4–10.5)
CO2: 30 mmol/L (ref 19–32)
CO2: 26 mmol/L (ref 19–32)
CREATININE: 1.34 mg/dL (ref 0.50–1.35)
Calcium: 8.5 mg/dL (ref 8.4–10.5)
Chloride: 101 mEq/L (ref 96–112)
Chloride: 98 mEq/L (ref 96–112)
Creatinine, Ser: 1.42 mg/dL — ABNORMAL HIGH (ref 0.50–1.35)
GFR calc Af Amer: 55 mL/min — ABNORMAL LOW (ref >90)
GFR calc non Af Amer: 47 mL/min — ABNORMAL LOW (ref >90)
GFR calc non Af Amer: 51 mL/min — ABNORMAL LOW (ref >90)
GFR, EST AFRICAN AMERICAN: 59 mL/min — AB (ref >90)
GLUCOSE: 176 mg/dL — AB (ref 70–99)
Glucose, Bld: 134 mg/dL — ABNORMAL HIGH (ref 70–99)
POTASSIUM: 6.4 mmol/L — AB (ref 3.5–5.1)
Potassium: 6.7 mmol/L (ref 3.5–5.1)
SODIUM: 136 mmol/L (ref 135–145)
SODIUM: 133 mmol/L — AB (ref 135–145)

## 2014-01-30 LAB — CBC WITH DIFFERENTIAL/PLATELET
Basophils Absolute: 0.1 10*3/uL (ref 0.0–0.1)
Basophils Relative: 0 % (ref 0–1)
EOS PCT: 2 % (ref 0–5)
Eosinophils Absolute: 0.5 10*3/uL (ref 0.0–0.7)
HCT: 25.4 % — ABNORMAL LOW (ref 39.0–52.0)
HEMOGLOBIN: 8.1 g/dL — AB (ref 13.0–17.0)
LYMPHS ABS: 2.4 10*3/uL (ref 0.7–4.0)
LYMPHS PCT: 12 % (ref 12–46)
MCH: 28.8 pg (ref 26.0–34.0)
MCHC: 31.9 g/dL (ref 30.0–36.0)
MCV: 90.4 fL (ref 78.0–100.0)
MONOS PCT: 9 % (ref 3–12)
Monocytes Absolute: 1.7 10*3/uL — ABNORMAL HIGH (ref 0.1–1.0)
Neutro Abs: 15.3 10*3/uL — ABNORMAL HIGH (ref 1.7–7.7)
Neutrophils Relative %: 77 % (ref 43–77)
PLATELETS: 371 10*3/uL (ref 150–400)
RBC: 2.81 MIL/uL — ABNORMAL LOW (ref 4.22–5.81)
RDW: 17 % — ABNORMAL HIGH (ref 11.5–15.5)
WBC: 19.9 10*3/uL — AB (ref 4.0–10.5)

## 2014-01-30 LAB — CBC
HCT: 24.2 % — ABNORMAL LOW (ref 39.0–52.0)
Hemoglobin: 7.7 g/dL — ABNORMAL LOW (ref 13.0–17.0)
MCH: 28.5 pg (ref 26.0–34.0)
MCHC: 31.8 g/dL (ref 30.0–36.0)
MCV: 89.6 fL (ref 78.0–100.0)
PLATELETS: 302 10*3/uL (ref 150–400)
RBC: 2.7 MIL/uL — ABNORMAL LOW (ref 4.22–5.81)
RDW: 16.9 % — AB (ref 11.5–15.5)
WBC: 16.8 10*3/uL — ABNORMAL HIGH (ref 4.0–10.5)

## 2014-01-30 LAB — GLUCOSE, CAPILLARY
GLUCOSE-CAPILLARY: 164 mg/dL — AB (ref 70–99)
GLUCOSE-CAPILLARY: 146 mg/dL — AB (ref 70–99)
Glucose-Capillary: 113 mg/dL — ABNORMAL HIGH (ref 70–99)
Glucose-Capillary: 30 mg/dL — CL (ref 70–99)
Glucose-Capillary: 85 mg/dL (ref 70–99)

## 2014-01-30 LAB — POTASSIUM
POTASSIUM: 5.8 mmol/L — AB (ref 3.5–5.1)
POTASSIUM: 5.3 mmol/L — AB (ref 3.5–5.1)
Potassium: 5.4 mmol/L — ABNORMAL HIGH (ref 3.5–5.1)

## 2014-01-30 MED ORDER — SODIUM BICARBONATE 8.4 % IV SOLN
50.0000 meq | Freq: Once | INTRAVENOUS | Status: AC
  Administered 2014-01-30: 50 meq via INTRAVENOUS
  Filled 2014-01-30 (×2): qty 50

## 2014-01-30 MED ORDER — SODIUM POLYSTYRENE SULFONATE 15 GM/60ML PO SUSP
30.0000 g | Freq: Once | ORAL | Status: AC
  Administered 2014-01-30: 30 g
  Filled 2014-01-30: qty 120

## 2014-01-30 MED ORDER — VANCOMYCIN HCL IN DEXTROSE 750-5 MG/150ML-% IV SOLN
750.0000 mg | Freq: Two times a day (BID) | INTRAVENOUS | Status: DC
  Administered 2014-01-31 – 2014-02-01 (×3): 750 mg via INTRAVENOUS
  Filled 2014-01-30 (×4): qty 150

## 2014-01-30 MED ORDER — DEXTROSE 50 % IV SOLN
1.0000 | Freq: Once | INTRAVENOUS | Status: AC
  Administered 2014-01-30: 50 mL via INTRAVENOUS
  Filled 2014-01-30 (×2): qty 50

## 2014-01-30 MED ORDER — VANCOMYCIN HCL 10 G IV SOLR
1500.0000 mg | Freq: Once | INTRAVENOUS | Status: AC
  Administered 2014-01-30: 1500 mg via INTRAVENOUS
  Filled 2014-01-30: qty 1500

## 2014-01-30 MED ORDER — DEXTROSE 50 % IV SOLN
INTRAVENOUS | Status: AC
  Administered 2014-01-30: 50 mL
  Filled 2014-01-30: qty 50

## 2014-01-30 MED ORDER — INSULIN ASPART 100 UNIT/ML IV SOLN
10.0000 [IU] | Freq: Once | INTRAVENOUS | Status: AC
  Administered 2014-01-30: 10 [IU] via INTRAVENOUS

## 2014-01-30 MED ORDER — DEXTROSE 10 % IV SOLN
Freq: Once | INTRAVENOUS | Status: AC
  Administered 2014-01-30: 11:00:00 via INTRAVENOUS

## 2014-01-30 NOTE — Progress Notes (Signed)
See long discussion from my note earlier. Family meeting completed. All questions answered. Plan of care as outline in prior note.  Will change to limited code status  Erick Colace ACNP-BC [place] Pager # [phone] OR # [phone] if no answer

## 2014-01-30 NOTE — Progress Notes (Signed)
Assisted PT in moving patient to chair. Patient very lethargic this AM with a cuff leak and able to talk around the trach. Air added to cuff to minimize the cuff leak. Mutual agreement made with speech, PT, RN and RT that patient is too lethargic and "not his normal" to proceed with the inline speaking valve at this time. Will continue to monitor.

## 2014-01-30 NOTE — Progress Notes (Signed)
RN, P.T and R.T. Note that patient has less stamina. Increase lethargy despite cut back on pain med . Patient received Trazadone H/S and is reported to interfere with his vent intermittently which may have compromised the quality of his oxygenation. Dr. Lamonte Sakai made aware of critical K6.7 at 11:00. Will monitor and set interventions in place.

## 2014-01-30 NOTE — Clinical Social Work Note (Signed)
CSW sat in on family meeting with physician.  Family is agreeable to [facility] placement in [place]- resources provided for family on [facility].  CSW faxed clinicals to [place] under review- bed offer pending.  CSW will continue to follow.  Domenica Reamer, [place] Social Worker [phone]

## 2014-01-30 NOTE — Progress Notes (Addendum)
Physical Therapy Treatment Patient Details Name: Dale Taylor MRN: [id number] DOB: [birth year] Today's Date: 01/30/2014    History of Present Illness Dale Taylor is a 74 y.o. male presenting with acute renal failure according to patient's nursing facility. PMH is significant for hypertension, Type 2 diabetes, chronic diastolic heart failure, hyperkalemia, HLD, prostate cancer, iron deficiency anemia, A. fib with RVR. Intubated 12/22, Extubated 12/24. Re-intubated 12/29.    PT Comments    Patient seen for therapy in conjunction with occupational therapy this session. Patient continues to remain lethargic during session, attempted EOB for arousal and then assisted patient OOB to chair.  Did required +3 assist for transfer to manage lines and airway during transfer.  Patient remains overall limited and weak with mobility. Will continue to see and progress as tolerated. Nursing made aware of patients lethargy the past 2 sessions.    Follow Up Recommendations  SNF     Equipment Recommendations  None recommended by PT    Recommendations for Other Services       Precautions / Restrictions Precautions Precautions: Fall Precaution Comments: vent, panda Restrictions Weight Bearing Restrictions: No    Mobility  Bed Mobility Overal bed mobility: Needs Assistance;+2 for physical assistance Bed Mobility: Supine to Sit;Sit to Supine Rolling: Max assist;Total assist;+2 for physical assistance   Supine to sit: Max assist;+2 for physical assistance     General bed mobility comments: patient with difficulty initiate LE movement to EOB, assist provided with LEs and chuck pad to rotate trunk and elevate to sitting.  Transfers Overall transfer level: Needs assistance (+3 to manage vent tubes) Equipment used:  (+ 2 gait belt and chuck pad) Transfers: Sit to/from [facility] Sit to Stand: Max assist;+2 physical assistance;+2 safety/equipment (+3 to manage vent tubes)             Ambulation/Gait                 Stairs            Wheelchair Mobility    Modified Rankin (Stroke Patients Only)       Balance Overall balance assessment: Needs assistance Sitting-balance support: Feet supported Sitting balance-Leahy Scale: Poor Sitting balance - Comments: patient tolerated EOB activity approximately 10 minutes, with assist, at times able to decreased to min assist, patient did respond to some VCs for upright posture but had difficulty maintaining. Manual facilitation of upright posture Postural control: Posterior lean   Standing balance-Leahy Scale: Zero                      Cognition Arousal/Alertness: Lethargic Behavior During Therapy: Flat affect Overall Cognitive Status: Difficult to assess Area of Impairment: Attention;Following commands;Problem solving   Current Attention Level: Focused Memory: Decreased short-term memory Following Commands: Follows one step commands consistently Safety/Judgement: Decreased awareness of safety;Decreased awareness of deficits Awareness: Intellectual Problem Solving: Slow processing;Decreased initiation;Difficulty sequencing;Requires verbal cues;Requires tactile cues General Comments: patient again presenting with increased lethargy this session, overall increased weakness, poor ability to carry out tasks, Max cues to arouse and direct to task    Exercises General Exercises - Lower Extremity Ankle Circles/Pumps: PROM;AAROM;10 reps    General Comments        Pertinent Vitals/Pain Pain Assessment: No/denies pain    Home Living                      Prior Function            PT Goals (  current goals can now be found in the care plan section) Acute Rehab PT Goals Patient Stated Goal: to get OOB PT Goal Formulation: With patient Time For Goal Achievement: 02/11/14 Potential to Achieve Goals: Fair Progress towards PT goals: Goals downgraded-see care plan    Frequency  Min  2X/week    PT Plan Current plan remains appropriate    Co-evaluation PT/OT/SLP Co-Evaluation/Treatment: Yes Reason for Co-Treatment: For patient/therapist safety;Complexity of the patient's impairments (multi-system involvement) PT goals addressed during session: Mobility/safety with mobility       End of Session Equipment Utilized During Treatment: Other (comment) (vent) Activity Tolerance: Patient limited by fatigue;Other (comment) Patient left: in chair;Other (comment) with respiratory team     Time: 3662-9476 PT Time Calculation (min) (ACUTE ONLY): 41 min  Charges:  $Therapeutic Activity: 23-37 mins                    G CodesDuncan Dull Feb 27, 2014, 10:51 AM Alben Deeds, PT DPT  [phone]

## 2014-01-30 NOTE — Progress Notes (Signed)
RT changed #6 shiley cuffed to a #6 shiley XLT distal per MD order, with MD on camera in the box. Good color change and bilateral breath sounds heard. Minimal bleeding. Patient had less coughing post this change and was able to seal leak with cuff pressure of 30. Getting good volumes. Will continue to monitor. RN aware.

## 2014-01-30 NOTE — Progress Notes (Signed)
SLP Cancellation Note  Patient Details Name: Dale Taylor MRN: [id number] DOB: [birth year]   Cancelled treatment:       Reason Eval/Treat Not Completed:  Pt poorly participatory, lethargic.  Achieving voice despite vent support and cuff inflation - leak that was noted yesterday is still apparent.  Hold in-line PMV today and will f/u next date.    Juan Quam Laurice 01/30/2014, 9:47 AM

## 2014-01-30 NOTE — Progress Notes (Signed)
PULMONARY / CRITICAL CARE MEDICINE   Name: Dale Taylor MRN: [id number] DOB: [birth year]    ADMISSION DATE:  01/06/2014 CONSULTATION DATE:  12/20  REFERRING MD :  FPTS  CHIEF COMPLAINT:  Respiratory failure    INITIAL PRESENTATION:  74 yo male CRI, prostate ca admitted with A on C renal failure, hyperkalemia, HCAP, UTI.    STUDIES/EVENTS:  12/20 Admitted to [facility] wit dx of PNA 12/20 Transferred to ICU with respiratory acidosis, hypersomnolence. PCCM consultation and assumption of care. Treated initially with NPPV 12/21 PAF with RVR. Amiodarone started. Converted to NSR. Cards consult obtained. Recommended continuing current rx. No anticoagulation given high risk 12/22 Increased dyspnea. White out of R hemithorax on CXR. Intubated. Copious purulent secretions from ETT.  12/24 Extubated 12/25 recurrent mucus plugging 12/27 Bronchoscopy for removal of airway secretions (Sood). Bronchomalacia noted 12/29 Re-intubation  01/02 Family conference. Discussed options and pt wished to proceed with trach tube placement and likely LTACH 01/04 Hyper K+, brady, d/ced lopressor. Treated hyperK+. 01/05 Recurrent R lung collapse by CXR. Tracheostomy tube placed. 01/06 Increasing left lung interstitial opacities suspicious for edema, improved aeration RLL. No pnx. 01/06 Recurrent R lung collapse. Repeat bronchoscopy with removal of airway secretions. BAL specimen sent for cytology > atypical cells in background of extensive inflammation 01/07 Transferred to vent SDU bed 01/11 PEG placed 01/14 start PMV trials 1/18 minimally tolerates inline speaking valve trials.  STUDIES: 12/21 TTE >>   EF 40 to 43%, grade 1 diastolic dysfx 15/40 Renal US >>   No hydronephrosis 12/23 CT chest >>  extensive pleural thickening on R. R lung with reduced volume. Bilateral R>L AS dz  INDWELLING DEVICES:: ETT 12/22 >> 12/24, 12/29 >> 01/06 Trach tube 01/06 >>  PEG 1/11 >> RT PICC 1/14 >>  MICRO DATA: Resp (BAL)  01/06 >> MSSA Sputum 1/18>>>sa>>>  ANTIMICROBIALS:  Vanc 12/20 >> 12/22, 01/06 >> 1/10 Cefepime 12/20 >> 12/28, 01/06 >> 1/10 Ancef 1/11 >>1/15  vanc 1/20>>>  SUBJECTIVE: Comfortable    VITAL SIGNS: Temp:  [98.6 F (37 C)-99.4 F (37.4 C)] 98.8 F (37.1 C) (01/20 0747) Pulse Rate:  [62-86] 74 (01/20 0805) Resp:  [17-31] 28 (01/20 0805) BP: (129-162)/(53-71) 139/59 mmHg (01/20 0805) SpO2:  [97 %-100 %] 100 % (01/20 0805) FiO2 (%):  [30 %-40 %] 30 % (01/20 0805) Weight:  [88.8 kg (195 lb 12.3 oz)] 88.8 kg (195 lb 12.3 oz) (01/20 0500)  Vent Mode:  [-] PRVC FiO2 (%):  [30 %-40 %] 30 % Set Rate:  [14 bmp] 14 bmp Vt Set:  [600 mL] 600 mL PEEP:  [8 cmH20] 8 cmH20 Plateau Pressure:  [18 cmH20-28 cmH20] 18 cmH20   INTAKE / OUTPUT:  Intake/Output Summary (Last 24 hours) at 01/30/14 1138 Last data filed at 01/30/14 1000  Gross per 24 hour  Intake   2305 ml  Output    950 ml  Net   1355 ml    PHYSICAL EXAMINATION: General: Calm, comfortable in bed on vent Neuro : Follows commands, no focal def. Interactive  HEENT: Skin erosion at inferior border of trach with tan secretions Cardiovascular: Regular Lungs: scattered rhonchi, moderate tracheal secretions in vent tubing Abdomen:  Obese, soft, +bs- active, suprapubic catheter, PEG c/d  Ext:  Warm, no edema, compression hose in place  LABS:  Recent Labs Lab 01/29/14 0630 01/30/14 0545 01/30/14 0900  NA 138 136 133*  K 5.8* 6.4* 6.7*  CL 102 101 98  CO2 27 30 26   BUN 48*  57* 57*  CREATININE 1.05 1.42* 1.34  GLUCOSE 145* 176* 134*    Recent Labs Lab 01/25/14 0430 01/30/14 0545 01/30/14 0900  HGB 9.2* 7.7* 8.1*  HCT 28.7* 24.2* 25.4*  WBC 18.0* 16.8* 19.9*  PLT 359 302 371    Dg Chest Port 1 View  01/30/2014   CLINICAL DATA:  Tracheostomy.  EXAM: PORTABLE CHEST - 1 VIEW  COMPARISON:  01/26/2014.  FINDINGS: Tracheostomy tube and PICC line in stable position. Postsurgical changes right lung. Interim Re  expansion of right lung. Subsegmental atelectasis noted throughout the right lung. Cardiomegaly with bilateral pulmonary infiltrates. Congestive heart failure cannot be excluded. Bilateral pneumonia cannot be excluded. Stable right-sided pleural thickening. No pneumothorax. No acute osseus abnormality.  IMPRESSION: 1. Tracheostomy tube and right PICC line in stable position . 2. Interim re-expansion of the right lung. 3. Multifocal subsegmental atelectasis right lung. Stable pleural thickening on the right. Postsurgical changes right lung. 4. Stable cardiomegaly and bilateral pulmonary alveolar infiltrates. Congestive heart failure cannot be excluded. Bilateral pneumonia cannot be excluded.   Electronically Signed   By: Marcello Moores  Register   On: 01/30/2014 07:37     ASSESSMENT / PLAN:   Acute on chronic hypercarbic respiratory failure, in setting of HCAP and purulent bronchitis (MSSA).  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 Recurrent 1/16, better 1/17 with mucus plug removal Chronic R pleural dz. Tracheostomy status >> stoma erosion. SA PNA   Plan:   Continue vent support  Continue vent bundle Continue nebulized BDs  Continue PT efforts Continue in-line speaking valve trials Start vanc 1/20  Hx of CAD, HTN, HLD, CHF, PVD. PAF this hospitalization. Deemed poor candidate for chronic anticoagulation. Plan:  Continue ASA, lipitor Holding clopidogrel  Continue amiodarone >> changed to 200 mg daily on 1/15 Continue lopressor   Acute on chronic renal failure (baseline scr 1.35)-->stable on 1/20 Persistent Hypernatremia. BPH, prostate cancer. Hyperkalemia  Plan:   Holding Proscar, flomax, tasigna for now Kayexalate now as well as insulin, D50 and bicarb Repeat K every 4 hrs until K < 4.5  Tracheostomy associated dysphagia. Hx of GERD. S/p PEG 1/11. Plan:   SUP: Famotidine Resumed Tube feeds.  Acute metabolic encephalopathy,  resolved. Chronic pain > improved 1/19 with addition of fentanyl patch and scheduled Apap as advised by Palliative  Chronic HA- seems to be occipital, radiating bilat to vertex. Severe deconditioning. 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 Plan:  Lovenox for DVT prevention Monitor CBC intermittently  DM 2, controlled. Hx of gout. P:   Continue SSI  Discussion  Long discussion with the patient exploring goals of care. We discussed treatment options of vent/snf vs stopping vent support. He is was very clear stating "I am not ready to die" as we started to discuss to possibility of stopping the vent. We discussed the probability that 1) he will never come off the vent 2) the closest facility now is in [place]. He was accepting of this and still wants to continue ventilation even if that means going out of state. We further discussed goals of care. We talked about limitations to medical therapy and I have advised him that with his co-morbids that CPR and ACLS in a patient with his co-morbids is unlikely to yield favorable results should his heart stop. He was accepting and agreed with this with his  niece at the bedside. I have advised limited code status which would include: on-going mechanical ventilation, central access and pressors as needed, but if in the setting of cardiac arrest we would not offer CPR or ACLS support. He was in agreement w/ this. At this point we will continue our efforts to maximize in-line PMV use, also possibly oral diet at some point. Acute issues to address today are his hyperkalemia and also the staph PNA. Will start vanc for this and he is currently being treated for the hyperkalemia. Will discuss our visit w/ the rest of his family later today. Will also specifically re-address our limited code discussion before placing this in his record as an  order to ensure that his family is also at peace with his decision.   Erick Colace ACNP-BC Prayan Ulin Lee Pager # [phone] OR # [phone] if no answer  Attending Note:  I have examined patient, reviewed labs, studies and notes. I have discussed the case with [name], and I agree with the data and plans as amended above. Pt with chronic respiratory failure and likely chronic vent dependence due to PNA superimposed on obstructive lung disease, bronchomalacia.He is stabilizing but do not believe he will be vent free. As above, these issues have been discussed with him and he would like to continue ventilation despite any lifestyle changes. We are also addressing code status. Will discuss further with pt and his family. Independent critical care time is 50 minutes.   Baltazar Apo, MD, PhD 01/30/2014, 1:31 PM [facility] [phone] or if no answer [phone]

## 2014-01-30 NOTE — Progress Notes (Signed)
   01/30/14 1200  PT Visit Information  Last PT Received On 01/30/14  Assistance Needed +2 (+3 to manage vent tubes during transfer)  History of Present Illness Dale Taylor is a 74 y.o. male presenting with acute renal failure according to patient's nursing facility. PMH is significant for hypertension, Type 2 diabetes, chronic diastolic heart failure, hyperkalemia, HLD, prostate cancer, iron deficiency anemia, A. fib with RVR. Intubated 12/22, Extubated 12/24. Re-intubated 12/29.  PT Time Calculation  PT Start Time (ACUTE ONLY) 1140  PT Stop Time (ACUTE ONLY) 1159  PT Time Calculation (min) (ACUTE ONLY) 19 min  Precautions  Precautions Fall  Precaution Comments vent, panda  Restrictions  Weight Bearing Restrictions No  Pain Assessment  Pain Assessment No/denies pain  Bed Mobility  Overal bed mobility Needs Assistance  Bed Mobility Sit to Supine  Rolling Max assist;Total assist;+2 for physical assistance  Sit to supine Max assist;+2 for physical assistance  Transfers  Overall transfer level Needs assistance (+3 to manage vent tubes)  Equipment used (+ 2 gait belt and chuck pad)  Transfers Sit to/from Stand;Stand Pivot Transfers  Sit to Stand Max assist;+2 physical assistance;+2 safety/equipment (+3 to manage vent tubes)  PT - End of Session  Equipment Utilized During Treatment Other (comment) (vent)  Activity Tolerance Patient limited by fatigue;Other (comment)  Patient left in bed  PT - Assessment/Plan  PT Plan Current plan remains appropriate  PT Frequency (ACUTE ONLY) Min 2X/week  Follow Up Recommendations SNF  PT equipment None recommended by PT  PT Goal Progression  Progress towards PT goals Progressing toward goals  Acute Rehab PT Goals  PT Goal Formulation With patient  Time For Goal Achievement 02/11/14  Potential to Achieve Goals Fair  PT General Charges  $$ ACUTE PT VISIT 1 Procedure  PT Treatments  $Therapeutic Activity 8-22 mins    Worked with nursing to  assist patient back to bed after 2 hours OOB in chair. Tolerated well, remains lethargic compared to previous sessions. Will continue to see and progress as tolerated.  Alben Deeds, [place] DPT  [phone]

## 2014-01-30 NOTE — Progress Notes (Signed)
MD ordered to change trach from 6 cuffed shiley to same size due to 2 week time mark and high amounts of air needed in cuff. Trach changed by 2 RT's. Good color change and bilateral breath sounds noted. Minimal bleeding noted. After change, still low exhaled volumes noted and Dr Leonidas Romberg notified. He suggested changing trach to #6 XLT distal. RN aware and trach is being ordered. Will continue to monitor.

## 2014-01-30 NOTE — Evaluation (Signed)
Occupational Therapy Evaluation Patient Details Name: Dale Taylor MRN: [id number] DOB: 07/31/1940 Today's Date: 01/30/2014    History of Present Illness Dale Taylor is a 74 y.o. male presenting with acute renal failure according to patient's nursing facility. PMH is significant for hypertension, Type 2 diabetes, chronic diastolic heart failure, hyperkalemia, HLD, prostate cancer, iron deficiency anemia, A. fib with RVR. Intubated 12/22, Extubated 12/24. Re-intubated 12/29.   Clinical Impression   Unclear what is pt's PLOF, but he was a SNF resident. Pt has had a prolonged, complicated hospitalization and is profoundly weak.  He presents with lethargy this date.  He is +2 dependent in all mobility and remains intubated. He is dependent in all ADL.  Will follow acutely. Pt will need SNF upon d/c.    Follow Up Recommendations  SNF;Supervision/Assistance - 24 hour    Equipment Recommendations       Recommendations for Other Services       Precautions / Restrictions Precautions Precautions: Fall Precaution Comments: vent, panda Restrictions Weight Bearing Restrictions: No      Mobility Bed Mobility Overal bed mobility: Needs Assistance;+2 for physical assistance Bed Mobility: Supine to Sit;Sit to Supine Rolling: Max assist;Total assist;+2 for physical assistance   Supine to sit: Max assist;+2 for physical assistance     General bed mobility comments: patient with difficulty initiate LE movement to EOB, assist provided with LEs and chuck pad to rotate trunk and elevate to sitting.  Transfers Overall transfer level: Needs assistance (+3 to manage vent tubes) Equipment used:  (+ 2 gait belt and chuck pad) Transfers: Sit to/from [facility] Sit to Stand: Max assist;+2 physical assistance;+2 safety/equipment (+3 to manage vent tubes)              Balance Overall balance assessment: Needs assistance Sitting-balance support: Feet supported Sitting  balance-Leahy Scale: Poor Sitting balance - Comments: patient tolerated EOB activity approximately 10 minutes, with assist, at times able to decreased to min assist, patient did respond to some VCs for upright posture but had difficulty maintaining. Manual facilitation of upright posture Postural control: Posterior lean   Standing balance-Leahy Scale: Zero                              ADL Overall ADL's : Needs assistance/impaired Eating/Feeding: NPO                                     General ADL Comments: Total care.     Vision                 Additional Comments: needs further assessment   Perception     Praxis      Pertinent Vitals/Pain Pain Assessment: No/denies pain     Hand Dominance Right   Extremity/Trunk Assessment Upper Extremity Assessment Upper Extremity Assessment: RUE deficits/detail;LUE deficits/detail RUE Deficits / Details: 2/5 strength in shoulder, 3/5 elbow and gross grasp, pt able to reach up to scratch his nose and remove his vent tube from his trach RUE Coordination: decreased fine motor;decreased gross motor LUE Deficits / Details: elbow flexion and gross flexion of fingers only, full PROM of shoulder.  Pt with longstanding joint contractures of 3rd and 4th fingers, no functional use observed LUE Coordination: decreased fine motor;decreased gross motor   Lower Extremity Assessment Lower Extremity Assessment: Defer to PT evaluation   Cervical / Trunk  Assessment Cervical / Trunk Assessment: Kyphotic   Communication Communication Communication: Tracheostomy (vent)   Cognition Arousal/Alertness: Lethargic Behavior During Therapy: Flat affect Overall Cognitive Status: Difficult to assess Area of Impairment: Attention;Following commands;Problem solving   Current Attention Level: Focused Memory: Decreased short-term memory Following Commands: Follows one step commands consistently Safety/Judgement: Decreased  awareness of safety;Decreased awareness of deficits Awareness: Intellectual Problem Solving: Slow processing;Decreased initiation;Difficulty sequencing;Requires verbal cues;Requires tactile cues General Comments: pt with only intermittent periods of alertness, seconds   General Comments       Exercises   Shoulder Instructions      Home Living Family/patient expects to be discharged to:: Skilled nursing facility                                        Prior Functioning/Environment Level of Independence: Needs assistance  Gait / Transfers Assistance Needed: pt reports he was walking limited distance <25' with RW PTA ADL's / Homemaking Assistance Needed: pt reports he was independent at [facility] prior to admission, per chart, pt was dependent        OT Diagnosis: Generalized weakness;Cognitive deficits   OT Problem List: Decreased strength;Decreased range of motion;Decreased activity tolerance;Impaired balance (sitting and/or standing);Decreased coordination;Decreased cognition;Decreased knowledge of use of DME or AE;Cardiopulmonary status limiting activity;Impaired UE functional use   OT Treatment/Interventions: Therapeutic exercise;Self-care/ADL training;Balance training;Patient/family education;Therapeutic activities;Cognitive remediation/compensation    OT Goals(Current goals can be found in the care plan section) Acute Rehab OT Goals Patient Stated Goal: to get OOB OT Goal Formulation: With patient Time For Goal Achievement: 02/13/14 Potential to Achieve Goals: Fair ADL Goals Pt Will Perform Grooming: with max assist;bed level Additional ADL Goal #1: Pt will demonstrate 3/5 strength B UEs. Additional ADL Goal #2: Pt will perform bed mobility with +2 mod assist. Additional ADL Goal #3: Pt will sit EOB with mod assist x 10 minutes. Additional ADL Goal #4: Pt will follow one step commands within 5 seconds of request.  OT Frequency: Min 2X/week   Barriers  to D/C:            Co-evaluation PT/OT/SLP Co-Evaluation/Treatment: Yes Reason for Co-Treatment: Complexity of the patient's impairments (multi-system involvement);For patient/therapist safety PT goals addressed during session: Mobility/safety with mobility OT goals addressed during session: Strengthening/ROM      End of Session Equipment Utilized During Treatment: Gait belt Nurse Communication: Mobility status;Need for lift equipment  Activity Tolerance: Patient limited by lethargy Patient left: in chair;with call bell/phone within reach   Time: 2440-1027 OT Time Calculation (min): 41 min Charges:  OT General Charges $OT Visit: 1 Procedure OT Evaluation $Initial OT Evaluation Tier I: 1 Procedure OT Treatments $Therapeutic Activity: 8-22 mins G-Codes:    Malka So 01/30/2014, 11:22 AM  [phone]

## 2014-01-30 NOTE — Progress Notes (Signed)
Critical K this AM. Blood BMP redrawn at 08:30. Will notify MD if repeat lab is critical.

## 2014-01-30 NOTE — Progress Notes (Signed)
Patient Dale Taylor      DOB: 29-Apr-1940      GUY:[id number]   Palliative Medicine Team at [facility] Progress Note    Subjective: Able to verbalize some with me on vent and without PMV.  States that headaches are gone.  Reports that trach is not bothering him too much.  Fatigues easily during conversation.     Filed Vitals:   01/30/14 0805  BP: 139/59  Pulse: 74  Temp:   Resp: 28   Physical exam: GEN: alert, NAD, fatigues with conversation HEENT: [place], sclera anicteric CV: RRR LUNGS: scattered coarse sounds EXT: edema  CBC    Component Value Date/Time   WBC 19.9* 01/30/2014 0900   RBC 2.81* 01/30/2014 0900   RBC 2.39* 06/30/2012 1830   HGB 8.1* 01/30/2014 0900   HCT 25.4* 01/30/2014 0900   PLT 371 01/30/2014 0900   MCV 90.4 01/30/2014 0900   MCH 28.8 01/30/2014 0900   MCHC 31.9 01/30/2014 0900   RDW 17.0* 01/30/2014 0900   LYMPHSABS 2.4 01/30/2014 0900   MONOABS 1.7* 01/30/2014 0900   EOSABS 0.5 01/30/2014 0900   BASOSABS 0.1 01/30/2014 0900    CMP     Component Value Date/Time   NA 133* 01/30/2014 0900   K 6.7* 01/30/2014 0900   CL 98 01/30/2014 0900   CO2 26 01/30/2014 0900   GLUCOSE 134* 01/30/2014 0900   BUN 57* 01/30/2014 0900   CREATININE 1.34 01/30/2014 0900   CALCIUM 8.8 01/30/2014 0900   PROT 4.8* 01/16/2014 0500   ALBUMIN 2.4* 01/16/2014 0500   AST 32 01/16/2014 0500   ALT 37 01/16/2014 0500   ALKPHOS 81 01/16/2014 0500   BILITOT 0.6 01/16/2014 0500   GFRNONAA 51* 01/30/2014 0900   GFRAA 78* 01/30/2014 0900     Assessment and plan: 74 yo male with PMHx of CKD, CAD, prostate CA, hip fracture who presented on 12/20 with AKI, PNA, acute resp failure. Long complicated course with recurrent intubation and eventual trach/peg placement in setting of bronchomalacia and RLL collapse. Palliative consulted for difficult to control headaches and larger goals of care.   1. Code Status: Full  2. GOC:  See initial consult.  I will see if I can  find time to meet with family today.Spoke with daughter briefly over phone.  SW working with family on disposition. Suspect this will be challenging with limited vent facilities.     3. Symptom Management:  1. Headaches, Chronic Pain: controlled. Far less PRN use with fentanyl patch, scheduled tylenol 2. Increased Secretions: Things like scopolamine patch could be considered but can have effect on cognition. I think robinul may make him more uncomfortable.  4. Psychosocial/Spiritual: Married, 7 children. Niece is Network engineer on ward. Had hip fracture over 1 year ago and dependent on cain for ambulation but was otherwise fairly functional prior to this event.   Doran Clay D.O. Palliative Medicine Team at [facility]  Pager: [phone] Team Phone: [phone]

## 2014-01-30 NOTE — Progress Notes (Signed)
ANTIBIOTIC CONSULT NOTE - INITIAL  Pharmacy Consult for vancomycin Indication: pneumonia  No Known Allergies Patient Measurements: Height: 5\' 11"  (180.3 cm) Weight: 195 lb 12.3 oz (88.8 kg) IBW/kg (Calculated) : 75.3 Vital Signs: Temp: 98.2 F (36.8 C) (01/20 1208) Temp Source: Oral (01/20 1208) BP: 127/59 mmHg (01/20 1208) Pulse Rate: 96 (01/20 1145) Intake/Output from previous day: 01/19 0701 - 01/20 0700 In: 2070 [NG/GT:2070] Out: 950 [Urine:950] Intake/Output from this shift: Total I/O In: 625 [NG/GT:625] Out: -   Labs:  Recent Labs  01/29/14 0630 01/30/14 0545 01/30/14 0900  WBC  --  16.8* 19.9*  HGB  --  7.7* 8.1*  PLT  --  302 371  CREATININE 1.05 1.42* 1.34   Estimated Creatinine Clearance: 52.3 mL/min (by C-G formula based on Cr of 1.34). No results for input(s): VANCOTROUGH, VANCOPEAK, VANCORANDOM, GENTTROUGH, GENTPEAK, GENTRANDOM, TOBRATROUGH, TOBRAPEAK, TOBRARND, AMIKACINPEAK, AMIKACINTROU, AMIKACIN in the last 72 hours.   Microbiology: Recent Results (from the past 720 hour(s))  Culture, respiratory (NON-Expectorated)     Status: None   Collection Time: 01/01/14  1:24 PM  Result Value Ref Range Status   Specimen Description TRACHEAL ASPIRATE  Final   Special Requests NONE  Final   Gram Stain   Final    RARE WBC PRESENT, PREDOMINANTLY MONONUCLEAR NO SQUAMOUS EPITHELIAL CELLS SEEN NO ORGANISMS SEEN Performed at [facility]    Culture   Final    Non-Pathogenic Oropharyngeal-type Flora Isolated. Performed at [facility]    Report Status 01/03/2014 FINAL  Final  Culture, respiratory (NON-Expectorated)     Status: None   Collection Time: 01/08/14 11:22 AM  Result Value Ref Range Status   Specimen Description TRACHEAL ASPIRATE  Final   Special Requests Normal  Final   Gram Stain   Final    FEW WBC PRESENT,BOTH PMN AND MONONUCLEAR RARE SQUAMOUS EPITHELIAL CELLS PRESENT NO ORGANISMS SEEN Performed at [facility]    Culture   Final    Non-Pathogenic Oropharyngeal-type Flora Isolated. Performed at [facility]    Report Status 01/10/2014 FINAL  Final  Clostridium Difficile by PCR     Status: None   Collection Time: 01/14/14 11:35 PM  Result Value Ref Range Status   C difficile by pcr NEGATIVE NEGATIVE Final  Culture, bal-quantitative     Status: None   Collection Time: 01/16/14  5:20 PM  Result Value Ref Range Status   Specimen Description BRONCHIAL ALVEOLAR LAVAGE  Final   Special Requests NONE  Final   Gram Stain   Final    FEW WBC PRESENT,BOTH PMN AND MONONUCLEAR RARE SQUAMOUS EPITHELIAL CELLS PRESENT NO ORGANISMS SEEN Performed at [place]   Final    50,000 COLONIES/ML Performed at [facility]    Culture   Final    STAPHYLOCOCCUS AUREUS Note: RIFAMPIN AND GENTAMICIN SHOULD NOT BE USED AS SINGLE DRUGS FOR TREATMENT OF STAPH INFECTIONS. This organism DOES NOT demonstrate inducible Clindamycin resistance in vitro. Performed at [facility]    Report Status 01/20/2014 FINAL  Final   Organism ID, Bacteria STAPHYLOCOCCUS AUREUS  Final      Susceptibility   Staphylococcus aureus - MIC*    CLINDAMYCIN <=0.25 SENSITIVE Sensitive     ERYTHROMYCIN >=8 RESISTANT Resistant     GENTAMICIN <=0.5 SENSITIVE Sensitive     LEVOFLOXACIN >=8 RESISTANT Resistant     OXACILLIN 0.5 SENSITIVE Sensitive     PENICILLIN >=0.5 RESISTANT Resistant  RIFAMPIN <=0.5 SENSITIVE Sensitive     TRIMETH/SULFA <=10 SENSITIVE Sensitive     VANCOMYCIN 1 SENSITIVE Sensitive     TETRACYCLINE <=1 SENSITIVE Sensitive     MOXIFLOXACIN 2 RESISTANT Resistant     * STAPHYLOCOCCUS AUREUS  Culture, Urine     Status: None   Collection Time: 01/16/14  8:58 PM  Result Value Ref Range Status   Specimen Description URINE, SUPRAPUBIC  Final   Special Requests NONE  Final   Colony Count   Final    >=100,000 COLONIES/ML Performed at [facility]    Culture   Final     Multiple bacterial morphotypes present, none predominant. Suggest appropriate recollection if clinically indicated. Performed at [facility]    Report Status 01/17/2014 FINAL  Final  Culture, blood (routine x 2)     Status: None   Collection Time: 01/16/14 10:46 PM  Result Value Ref Range Status   Specimen Description BLOOD RIGHT ARM  Final   Special Requests BOTTLES DRAWN AEROBIC AND ANAEROBIC 10CC  Final   Culture   Final    NO GROWTH 5 DAYS Performed at [facility]    Report Status 01/23/2014 FINAL  Final  Culture, blood (routine x 2)     Status: None   Collection Time: 01/16/14 10:52 PM  Result Value Ref Range Status   Specimen Description BLOOD RIGHT HAND  Final   Special Requests BOTTLES DRAWN AEROBIC ONLY [place]  Final   Culture   Final    NO GROWTH 5 DAYS Performed at [facility]    Report Status 01/23/2014 FINAL  Final  Culture, respiratory (NON-Expectorated)     Status: None (Preliminary result)   Collection Time: 01/28/14 12:05 PM  Result Value Ref Range Status   Specimen Description TRACHEAL ASPIRATE  Final   Special Requests NONE  Final   Gram Stain   Final    FEW WBC PRESENT,BOTH PMN AND MONONUCLEAR RARE SQUAMOUS EPITHELIAL CELLS PRESENT RARE GRAM POSITIVE COCCI IN PAIRS Performed at [facility]    Culture   Final    FEW STAPHYLOCOCCUS AUREUS Note: RIFAMPIN AND GENTAMICIN SHOULD NOT BE USED AS SINGLE DRUGS FOR TREATMENT OF STAPH INFECTIONS. Performed at [facility]    Report Status PENDING  Incomplete    Medical History: Past Medical History  Diagnosis Date  . Hypertension   . Hypercholesterolemia   . GERD (gastroesophageal reflux disease)   . Dyspnea on exertion   . Claudication in peripheral vascular disease   . PVD (peripheral vascular disease)   . S/P renal artery angioplasty 2009--- W/ STENT X1  TO LEFT COMMON ILIAC    . ED (erectile dysfunction)   . History of BPH   . Frequency of urination   .  Urgency of urination   . Nocturia   . Chronic gouty arthritis FEET  . Diabetes mellitus     type II-- ORAL MED  . Prostate cancer SCHEDULED FOR RADIOACTIVE SEED IMPLANTS OF PROSTATE  05-18-2011    FOLLOWED BY DR [facility] AND DR Valere Dross  . CML (chronic myelocytic leukemia) FOLLOWED BY DR LEWIS ([place])    TAKES SPRYCEL PO MED FOR TX--  PT STATES STABLE  NO SYMPTOMS  . Iron deficiency anemia   . Chronic renal insufficiency   . PAD (peripheral artery disease)   . Cardiovascular disease with arteriosclerosis   . Blood transfusion LAST TIME NOV 2012  --  X2  UNITS  (FOR ANEMIA  AND THROMBOCYTOPENIA  . Leukocytosis   . Peripheral edema   . History of thrombocytopenia   . Heart murmur   . CHF (congestive heart failure) ADMISSION NOV 2012  [facility])  REQUESTED RECORDS    DIATOSLIC CHF WITH LEFT VENTRICULAR IMPAIRED RELAXATION-----   CARDIOLOGIST----  DR Dallie Piles  . Hypertension   . Arthritis   . History of angina   . Acute renal failure Admitted to [facility] 06/06/12 with hip fracture; creatinine 4.3, K 7.8. Aldactone and lisinopril stopped    Medications:  Anti-infectives    Start     Dose/Rate Route Frequency Ordered Stop   01/21/14 2200  ceFAZolin (ANCEF) IVPB 2 g/50 mL premix     2 g100 mL/hr over 30 Minutes Intravenous 3 times per day 01/21/14 1603 01/25/14 2359   01/21/14 1600  ceFAZolin (ANCEF) IVPB 2 g/50 mL premix     2 g100 mL/hr over 30 Minutes Intravenous On call 01/19/14 0939 01/21/14 1658   01/21/14 0800  ceFAZolin (ANCEF) IVPB 1 g/50 mL premix  Status:  Discontinued     1 g100 mL/hr over 30 Minutes Intravenous 3 times per day 01/20/14 1656 01/21/14 1603   01/18/14 2000  vancomycin (VANCOCIN) IVPB 750 mg/150 ml premix  Status:  Discontinued     750 mg150 mL/hr over 60 Minutes Intravenous Every 12 hours 01/18/14 1304 01/20/14 1656   01/18/14 0900  ceFEPIme (MAXIPIME) 1 g in dextrose 5 % 50 mL IVPB  Status:  Discontinued     1 g100 mL/hr over 30 Minutes  Intravenous Every 24 hours 01/17/14 1132 01/20/14 1656   01/18/14 0800  vancomycin (VANCOCIN) 1,250 mg in sodium chloride 0.9 % 250 mL IVPB  Status:  Discontinued     1,250 mg166.7 mL/hr over 90 Minutes Intravenous Every 24 hours 01/17/14 1130 01/18/14 1303   01/17/14 0930  vancomycin (VANCOCIN) IVPB 750 mg/150 ml premix  Status:  Discontinued     750 mg150 mL/hr over 60 Minutes Intravenous Every 12 hours 01/16/14 2056 01/17/14 1130   01/16/14 2130  vancomycin (VANCOCIN) 1,500 mg in sodium chloride 0.9 % 500 mL IVPB     1,500 mg250 mL/hr over 120 Minutes Intravenous  Once 01/16/14 2056 01/16/14 2330   01/16/14 2100  ceFEPIme (MAXIPIME) 1 g in dextrose 5 % 50 mL IVPB  Status:  Discontinued     1 g100 mL/hr over 30 Minutes Intravenous Every 12 hours 01/16/14 2056 01/17/14 1132   01/05/14 2200  ceFEPIme (MAXIPIME) 1 g in dextrose 5 % 50 mL IVPB     1 g100 mL/hr over 30 Minutes Intravenous Every 12 hours 01/05/14 1446 01/07/14 2156   01/01/14 1330  ceFEPIme (MAXIPIME) 1 g in dextrose 5 % 50 mL IVPB  Status:  Discontinued     1 g100 mL/hr over 30 Minutes Intravenous Every 24 hours 01/01/14 1235 01/05/14 1446   12/31/13 1400  vancomycin (VANCOCIN) IVPB 1000 mg/200 mL premix  Status:  Discontinued     1,000 mg200 mL/hr over 60 Minutes Intravenous Every 24 hours 12/29/2013 1319 12/31/13 0905   12/31/13 1200  ceFEPIme (MAXIPIME) 1 g in dextrose 5 % 50 mL IVPB  Status:  Discontinued     1 g100 mL/hr over 30 Minutes Intravenous Every 24 hours 12/29/2013 1906 12/31/13 1317   12/31/13 1000  vancomycin (VANCOCIN) 1,500 mg in sodium chloride 0.9 % 500 mL IVPB  Status:  Discontinued     1,500 mg250 mL/hr over 120 Minutes Intravenous Every 48 hours 12/31/13  7225 12/31/13 1317   12/12/2013 1330  vancomycin (VANCOCIN) IVPB 1000 mg/200 mL premix     1,000 mg200 mL/hr over 60 Minutes Intravenous  Once 12/24/2013 1315 12/11/2013 1605   01/05/2014 1300  ceFEPIme (MAXIPIME) 1 g in dextrose 5 % 50 mL IVPB     1 g100 mL/hr over 30  Minutes Intravenous  Once 01/07/2014 1248 12/26/2013 1343     Assessment: 74 year old male s/p MSSA pneumonia treated with Ancef now s/p mucus plug removal and persistent staph aureus in respiratory culture - sensitivities pending. WBC 19.9 (rising). Afebrile. SCr improving (1.42 >>1.34) with normalized CrCl ~ 50 mL/min.   Goal of Therapy:  Vancomycin trough level 15-20 mcg/ml  Plan:  Vancomycin 1500 mg IV x1 then 750 mg IV every 12 hours.  Follow-up culture sensitivities.  Monitor renal function and clinical status.   Sloan Leiter, PharmD, BCPS Clinical Pharmacist [phone] 01/30/2014,12:25 PM

## 2014-01-31 LAB — GLUCOSE, CAPILLARY
GLUCOSE-CAPILLARY: 130 mg/dL — AB (ref 70–99)
Glucose-Capillary: 153 mg/dL — ABNORMAL HIGH (ref 70–99)
Glucose-Capillary: 153 mg/dL — ABNORMAL HIGH (ref 70–99)
Glucose-Capillary: 164 mg/dL — ABNORMAL HIGH (ref 70–99)
Glucose-Capillary: 165 mg/dL — ABNORMAL HIGH (ref 70–99)
Glucose-Capillary: 169 mg/dL — ABNORMAL HIGH (ref 70–99)

## 2014-01-31 LAB — POTASSIUM
POTASSIUM: 5.6 mmol/L — AB (ref 3.5–5.1)
POTASSIUM: 5 mmol/L (ref 3.5–5.1)
Potassium: 5.3 mmol/L — ABNORMAL HIGH (ref 3.5–5.1)
Potassium: 5.4 mmol/L — ABNORMAL HIGH (ref 3.5–5.1)
Potassium: 5 mmol/L (ref 3.5–5.1)

## 2014-01-31 LAB — CBC
HCT: 24.2 % — ABNORMAL LOW (ref 39.0–52.0)
HEMOGLOBIN: 7.7 g/dL — AB (ref 13.0–17.0)
MCH: 28.6 pg (ref 26.0–34.0)
MCHC: 31.8 g/dL (ref 30.0–36.0)
MCV: 90 fL (ref 78.0–100.0)
PLATELETS: 316 10*3/uL (ref 150–400)
RBC: 2.69 MIL/uL — AB (ref 4.22–5.81)
RDW: 17.3 % — AB (ref 11.5–15.5)
WBC: 14.2 10*3/uL — ABNORMAL HIGH (ref 4.0–10.5)

## 2014-01-31 LAB — BASIC METABOLIC PANEL
ANION GAP: 6 (ref 5–15)
BUN: 57 mg/dL — AB (ref 6–23)
CHLORIDE: 100 meq/L (ref 96–112)
CO2: 30 mmol/L (ref 19–32)
Calcium: 8.4 mg/dL (ref 8.4–10.5)
Creatinine, Ser: 1.33 mg/dL (ref 0.50–1.35)
GFR calc Af Amer: 60 mL/min — ABNORMAL LOW (ref >90)
GFR, EST NON AFRICAN AMERICAN: 51 mL/min — AB (ref >90)
GLUCOSE: 177 mg/dL — AB (ref 70–99)
POTASSIUM: 5 mmol/L (ref 3.5–5.1)
SODIUM: 136 mmol/L (ref 135–145)

## 2014-01-31 MED ORDER — SODIUM POLYSTYRENE SULFONATE 15 GM/60ML PO SUSP
30.0000 g | Freq: Once | ORAL | Status: AC
  Administered 2014-01-31: 30 g
  Filled 2014-01-31: qty 120

## 2014-01-31 NOTE — Progress Notes (Signed)
RT assisted speech in in-line speaking valve. Patients cuff deflated and made sure patient could maintain a patent airway with recent trach change to a #6 Shiley XLT distal. Significant decrease in exhaled VT and patient able to speak around trach. Valve placed inline and PEEP dropped to 3, VT slowly increased to 900 to maintain same PIP of 27. Patient coughing and SOB at first with a drop in sats, however, recovered quickly after coughing stopped. Sats up to 93% and breathing slowed down. Patient able to tolerate about 15 minutes and then let us know he was tired and ready to rest. Valve removed and original settings returned to on vent of VT 600, rate 14, PEEP 5, and Fi02 of 40%. Cuff re-inflated. Patient resting comfortably. Will continue to monitor.

## 2014-01-31 NOTE — Progress Notes (Signed)
PULMONARY / CRITICAL CARE MEDICINE   Name: Dale Taylor MRN: [id number] DOB: 05-21-1940    ADMISSION DATE:  12/16/2013 CONSULTATION DATE:  12/20  REFERRING MD :  FPTS  CHIEF COMPLAINT:  Respiratory failure    INITIAL PRESENTATION:  74 yo male CRI, prostate ca admitted with A on C renal failure, hyperkalemia, HCAP, UTI.    STUDIES/EVENTS:  12/20 Admitted to [place] wit dx of PNA 12/20 Transferred to ICU with respiratory acidosis, hypersomnolence. PCCM consultation and assumption of care. Treated initially with NPPV 12/21 PAF with RVR. Amiodarone started. Converted to NSR. Cards consult obtained. Recommended continuing current rx. No anticoagulation given high risk 12/22 Increased dyspnea. White out of R hemithorax on CXR. Intubated. Copious purulent secretions from ETT.  12/24 Extubated 12/25 recurrent mucus plugging 12/27 Bronchoscopy for removal of airway secretions ([place]). Bronchomalacia noted 12/29 Re-intubation  01/02 Family conference. Discussed options and pt wished to proceed with trach tube placement and likely LTACH 01/04 Hyper K+, brady, d/ced lopressor. Treated hyperK+. 01/05 Recurrent R lung collapse by CXR. Tracheostomy tube placed. 01/06 Increasing left lung interstitial opacities suspicious for edema, improved aeration RLL. No pnx. 01/06 Recurrent R lung collapse. Repeat bronchoscopy with removal of airway secretions. BAL specimen sent for cytology > atypical cells in background of extensive inflammation 01/07 Transferred to vent SDU bed 01/11 PEG placed 01/14 start PMV trials 1/18 minimally tolerates inline speaking valve trials.  STUDIES: 12/21 TTE >>   EF 40 to 95%, grade 1 diastolic dysfx 62/13 Renal US >>   No hydronephrosis 12/23 CT chest >>  extensive pleural thickening on R. R lung with reduced volume. Bilateral R>L AS dz  INDWELLING DEVICES:: ETT 12/22 >> 12/24, 12/29 >> 01/06 Trach tube 01/06 >> 1/21 changed to 6 XLT distal >>> PEG 1/11 >> RT PICC 1/14  >>  MICRO DATA: Resp (BAL) 01/06 >> MSSA Sputum 1/18>>>sa>>>  ANTIMICROBIALS:  Vanc 12/20 >> 12/22, 01/06 >> 1/10 Cefepime 12/20 >> 12/28, 01/06 >> 1/10 Ancef 1/11 >>1/15  vanc 1/20>>>  SUBJECTIVE: Comfortable    VITAL SIGNS: Temp:  [96.2 F (35.7 C)-98.6 F (37 C)] 98.6 F (37 C) (01/21 0800) Pulse Rate:  [69-96] 69 (01/21 0921) Resp:  [17-30] 30 (01/21 0806) BP: (115-150)/(48-84) 141/57 mmHg (01/21 0921) SpO2:  [91 %-100 %] 93 % (01/21 0806) FiO2 (%):  [30 %-40 %] 40 % (01/21 0806) Weight:  [91.7 kg (202 lb 2.6 oz)] 91.7 kg (202 lb 2.6 oz) (01/21 0748)  Vent Mode:  [-] PRVC FiO2 (%):  [30 %-40 %] 40 % Set Rate:  [14 bmp] 14 bmp Vt Set:  [600 mL] 600 mL PEEP:  [5 cmH20-8 cmH20] 5 cmH20 Pressure Support:  [10 cmH20] 10 cmH20 Plateau Pressure:  [16 cmH20-26 cmH20] 26 cmH20   INTAKE / OUTPUT:  Intake/Output Summary (Last 24 hours) at 01/31/14 0933 Last data filed at 01/31/14 0600  Gross per 24 hour  Intake   3090 ml  Output    900 ml  Net   2190 ml    PHYSICAL EXAMINATION: General: Calm, comfortable in bed on vent Neuro : Follows commands, no focal def. Interactive  HEENT: Skin erosion at inferior border of trach with tan secretions-->secretions better  Cardiovascular: Regular Lungs: scattered rhonchi Abdomen:  Obese, soft, +bs- active, suprapubic catheter, PEG c/d  Ext:  Warm, no edema, compression hose in place  LABS:  Recent Labs Lab 01/30/14 0545 01/30/14 0900  01/30/14 2305 01/31/14 0258 01/31/14 0658  NA 136 133*  --   --   --  136  K 6.4* 6.7*  < > 5.3* 5.3* 5.0  CL 101 98  --   --   --  100  CO2 30 26  --   --   --  30  BUN 57* 57*  --   --   --  57*  CREATININE 1.42* 1.34  --   --   --  1.33  GLUCOSE 176* 134*  --   --   --  177*  < > = values in this interval not displayed.  Recent Labs Lab 01/30/14 0545 01/30/14 0900 01/31/14 0658  HGB 7.7* 8.1* 7.7*  HCT 24.2* 25.4* 24.2*  WBC 16.8* 19.9* 14.2*  PLT 302 371 316    Dg Chest  Port 1 View  01/30/2014   CLINICAL DATA:  Tracheostomy.  EXAM: PORTABLE CHEST - 1 VIEW  COMPARISON:  01/26/2014.  FINDINGS: Tracheostomy tube and PICC line in stable position. Postsurgical changes right lung. Interim Re expansion of right lung. Subsegmental atelectasis noted throughout the right lung. Cardiomegaly with bilateral pulmonary infiltrates. Congestive heart failure cannot be excluded. Bilateral pneumonia cannot be excluded. Stable right-sided pleural thickening. No pneumothorax. No acute osseus abnormality.  IMPRESSION: 1. Tracheostomy tube and right PICC line in stable position . 2. Interim re-expansion of the right lung. 3. Multifocal subsegmental atelectasis right lung. Stable pleural thickening on the right. Postsurgical changes right lung. 4. Stable cardiomegaly and bilateral pulmonary alveolar infiltrates. Congestive heart failure cannot be excluded. Bilateral pneumonia cannot be excluded.   Electronically Signed   By: Marcello Moores  Register   On: 01/30/2014 07:37     ASSESSMENT / PLAN:   Acute on chronic hypercarbic respiratory failure, in setting of HCAP and purulent bronchitis (MSSA).  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 Recurrent 1/16, better 1/17 with mucus plug removal Chronic R pleural dz. Tracheostomy status >> stoma erosion. SA PNA  Plan:   Continue vent support  Continue vent bundle Continue nebulized BDs  Continue PT efforts Continue in-line speaking valve trials Started vanc 1/20  Hx of CAD, HTN, HLD, CHF, PVD. PAF this hospitalization. Deemed poor candidate for chronic anticoagulation. Plan:  Continue ASA, lipitor Holding clopidogrel  Continue amiodarone >> changed to 200 mg daily on 1/15 Continue lopressor   Acute on chronic renal failure (baseline scr 1.35)-->stable on 1/20 Persistent Hypernatremia-->improved BPH, prostate cancer. Hyperkalemia, slow improvement but not at goal Plan:   Holding Proscar,  flomax, tasigna for now Repeat Kayexalate today  Repeat K every 4 hrs until K < 4.5  Tracheostomy associated dysphagia. Hx of GERD. S/p PEG 1/11. Plan:   SUP: Famotidine Resumed Tube feeds.  Acute metabolic encephalopathy, resolved. Chronic pain > improved 1/19 with addition of fentanyl patch and scheduled Apap as advised by Palliative  Chronic HA- seems to be occipital, radiating bilat to vertex. Severe deconditioning. 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 Plan:  Lovenox for DVT prevention Monitor CBC intermittently  DM 2, controlled. Hx of gout. P:   Continue SSI  Discussion  Feels a little better. Secretions improved after starting abx. Hope to c/w in-line PMV trials.   Erick Colace ACNP-BC [place] Pager # [phone] OR # [phone] if no answer  Attending Note:  I have examined patient, reviewed labs, studies and notes. I have discussed the case with [name], and I  agree with the data and plans as amended above.  Baltazar Apo, MD, PhD 01/31/2014, 1:21 PM  Pulmonary and Critical Care [phone] or if no answer [phone]

## 2014-01-31 NOTE — Trach Care Team (Signed)
[place] Progression Note   Patient Details Name: Dale Taylor MRN: [id number] DOB: August 01, 1940 Today's Date: 01/31/2014   Tracheostomy Assessment    Tracheostomy Shiley 6 mm Cuffed;Distal (Active)  Status Secured 01/31/2014 12:07 PM  Site Assessment Clean;Dry 01/31/2014 12:07 PM  Site Care Dressing applied 01/31/2014 12:07 PM  Inner Cannula Care Changed/new 01/31/2014 12:00 AM  Ties Assessment Clean;Dry;Secure 01/31/2014 12:07 PM  Cuff pressure (cm) 34 cm 01/31/2014  3:25 AM  Trach Changed Yes 01/30/2014  6:00 PM  Emergency Equipment at bedside Yes 01/31/2014 12:07 PM     Care Needs Suture removal post-op day #7 :  (sutures have been removed)   Respiratory Therapy O2 Device: Ventilator FiO2 (%): 40 % SpO2: 97 % Education:  (not needed at this time) Who was educated?:  (none needed at this time) Follow up recommendations:  (will follow as needed) Respiratory barriers to progression:  (per CSW - plan is discharge to vent snf)    Speech Language Pathology  SLP chart review complete: Patient currently receiving at SLP services (In line PMSV) Patient may use Passy-Muir Speech Valve: with SLP only (and RT) PMSV Supervision: Full Proceed with Objective Swallowing Evaluation:  (Possible in the future) Follow up Recommendations: Skilled Nursing facility SLP barriers to progression: Secretions (fatigue)   Physical Therapy Ambulation/Gait assistance:  (pt unable) PT Recommendation/Assessment: Patient needs continued PT services Follow Up Recommendations: SNF PT equipment: None recommended by PT    Occupational Therapy OT Recommendation/Assessment: Patient needs continued OT Services Follow Up Recommendations: SNF, Supervision/Assistance - 24 hour    Nutritional Patient's Current Diet: Tube feeding Tube Feeding: Vital AF 1.2 Cal Tube Feeding Frequency: Continuous Tube Feeding Strength: Full strength    Case Management/Social Work Level of patient care prior to hospitalization:  Home-Self care Living status: Spouse Insurance payer: Other (comment) ([employer] Medicare) Barriers to progression: [place] Medicaid Plan to address barriers: in process Anticipated discharge disposition: SNF/Assisted  living facility    Provider                                                      [place] Team/Provider                           Recommendations [place] Team Members Present-  Ciro Backer, RT, Alvino Blood, SW, Molli Barrows, RD, Herbie [place], SLP    None. Plan to discharge to [place] vent/SNF          Merrily Tegeler, Jaci Carrel (scribe for team) 01/31/2014, 1:43 PM

## 2014-01-31 NOTE — Progress Notes (Signed)
Speech Language Pathology Treatment: Dale Taylor Speaking valve  Patient Details Name: Dale Taylor MRN: [id number] DOB: 07-06-1940 Today's Date: 01/31/2014 Time: 2637-8588 SLP Time Calculation (min) (ACUTE ONLY): 25 min  Assessment / Plan / Recommendation Clinical Impression  Pt seen for in-line PMSV therapeutic trials with RT to assist with vent management. Pt demonstrated improved tolerance of valve this session with decreased secretions,coughing and distress and improved subjective tolerance per pt, though time with valve still brief due to pt report of pain and readiness for rest.   Pt demonstrated upper airway patency following cuff deflation (Expiratory volume dropped from 554-254, pt phonating), despite new Shiley 6 XLT. With valve placement pt initially quite short of breath with accessory breathing and decreased O2 sats after initial vent adjustments. Inspiratory volume increased slightly more to 900 bringing PIP to 27 (baseline prior to cuff deflation, though typically pt at 24 per RT) and setting PEEP at 3 with significant improvement. O2 sats increased to 93 and RR decreased to 14. Vocal quality still breathy at times, though pt improved breath support independently when making purposeful requests. Overall, pt progressing well with trials, will continue efforts.    HPI HPI: 74 y.o. male admitted with respiratory failure; tracheostomy, acute on chronic renal failure, right lung collapse, HCAP and UTI. The patient has a history of prostate cancer, PAF, CAD.  Intubated 12/20-12/24; reintubated12/29; trach 1/5; continues on vent. Orders received for inline PMV.  Pt meets criteria for inline valve trials.       Pertinent Vitals    SLP Plan  Continue with current plan of care    Recommendations        Patient may use Passy-Muir Speech Valve: with SLP only ((and RT)) PMSV Supervision: Full       Follow up Recommendations: Skilled Nursing facility Plan: Continue with current plan of care     GO    [facility], MA CCC-SLP [phone]  Dale Taylor 01/31/2014, 2:53 PM

## 2014-02-01 LAB — COMPREHENSIVE METABOLIC PANEL
ALT: 63 U/L — ABNORMAL HIGH (ref 0–53)
ANION GAP: 7 (ref 5–15)
AST: 76 U/L — ABNORMAL HIGH (ref 0–37)
Albumin: 2.4 g/dL — ABNORMAL LOW (ref 3.5–5.2)
Alkaline Phosphatase: 353 U/L — ABNORMAL HIGH (ref 39–117)
BUN: 58 mg/dL — ABNORMAL HIGH (ref 6–23)
CALCIUM: 8 mg/dL — AB (ref 8.4–10.5)
CO2: 31 mmol/L (ref 19–32)
Chloride: 96 mEq/L (ref 96–112)
Creatinine, Ser: 1.38 mg/dL — ABNORMAL HIGH (ref 0.50–1.35)
GFR calc Af Amer: 57 mL/min — ABNORMAL LOW (ref >90)
GFR calc non Af Amer: 49 mL/min — ABNORMAL LOW (ref >90)
GLUCOSE: 139 mg/dL — AB (ref 70–99)
Potassium: 4.7 mmol/L (ref 3.5–5.1)
SODIUM: 134 mmol/L — AB (ref 135–145)
TOTAL PROTEIN: 5.7 g/dL — AB (ref 6.0–8.3)
Total Bilirubin: 0.6 mg/dL (ref 0.3–1.2)

## 2014-02-01 LAB — GLUCOSE, CAPILLARY
GLUCOSE-CAPILLARY: 101 mg/dL — AB (ref 70–99)
GLUCOSE-CAPILLARY: 160 mg/dL — AB (ref 70–99)
Glucose-Capillary: 77 mg/dL (ref 70–99)
Glucose-Capillary: 158 mg/dL — ABNORMAL HIGH (ref 70–99)
Glucose-Capillary: 147 mg/dL — ABNORMAL HIGH (ref 70–99)
Glucose-Capillary: 147 mg/dL — ABNORMAL HIGH (ref 70–99)
Glucose-Capillary: 205 mg/dL — ABNORMAL HIGH (ref 70–99)

## 2014-02-01 LAB — CULTURE, RESPIRATORY

## 2014-02-01 LAB — POTASSIUM
POTASSIUM: 4.9 mmol/L (ref 3.5–5.1)
POTASSIUM: 4.8 mmol/L (ref 3.5–5.1)
Potassium: 4.8 mmol/L (ref 3.5–5.1)
Potassium: 5.1 mmol/L (ref 3.5–5.1)

## 2014-02-01 LAB — CULTURE, RESPIRATORY W GRAM STAIN

## 2014-02-01 MED ORDER — CEFAZOLIN SODIUM 1-5 GM-% IV SOLN
1.0000 g | Freq: Three times a day (TID) | INTRAVENOUS | Status: DC
  Administered 2014-02-01 – 2014-02-04 (×9): 1 g via INTRAVENOUS
  Filled 2014-02-01 (×12): qty 50

## 2014-02-01 MED ORDER — HYDROMORPHONE HCL 2 MG PO TABS
4.0000 mg | ORAL_TABLET | ORAL | Status: DC | PRN
Start: 2014-02-01 — End: 2014-02-04
  Administered 2014-02-01 – 2014-02-04 (×16): 4 mg via ORAL
  Filled 2014-02-01 (×16): qty 2

## 2014-02-01 NOTE — Progress Notes (Signed)
Received call from nursing that patient wanted to be removed from vent. Her reportedly has told this to multiple staff in unit. Reviewed SLP note stating that he is ready to "go to heaven".  I think this is certainly reasonable given his situation. I advised to call primary team.  Family will need to be made aware of desire, if not already done. I have unfortunately not been able to have much connection to family (timing issues with my and their availability).  We have very limited palliative care services with weather issues today.  If there is a question we can help answer feel free to contact our team.  As long as family is aware and had chance to talk with Francee Piccolo, I do not see any issue with comfort care.  He has had trouble with fatigue using inline speaking valve, so I would suspect he will probably need continuous opioid infusion as well as PRN dosing. PRN benzo's will also likely be needed.    Doran Clay D.O. Palliative Medicine Team at [facility]  Pager: [phone] Team Phone: [phone]

## 2014-02-01 NOTE — Progress Notes (Signed)
Patient expressed that he was tired of being on the ventilator. RT advised the patient that the ventilator was helping him breathe and if he was to be taken off the ventilator he would possibly not be able to breathe on his own. Patient expressed that he understood the circumstances. RT advised patient that his concerns would be passed along to the RN.  RT made RN aware.

## 2014-02-01 NOTE — Progress Notes (Signed)
ANTIBIOTIC CONSULT NOTE - INITIAL  Pharmacy Consult for Ancef Indication: pneumonia (MSSA)  No Known Allergies  Patient Measurements: Height: 5\' 11"  (180.3 cm) Weight: 199 lb 1.2 oz (90.3 kg) IBW/kg (Calculated) : 75.3 Vital Signs: Temp: 98.2 F (36.8 C) (01/22 1243) Temp Source: Oral (01/22 1243) BP: 150/67 mmHg (01/22 1243) Pulse Rate: 73 (01/22 1243) Intake/Output from previous day: 01/21 0701 - 01/22 0700 In: 2090 [NG/GT:1940; IV Piggyback:150] Out: 950 [Urine:950] Intake/Output from this shift: Total I/O In: 10 [I.V.:10] Out: 550 [Urine:550]  Labs:  Recent Labs  01/30/14 0545 01/30/14 0900 01/31/14 0658 02/01/14 0600  WBC 16.8* 19.9* 14.2*  --   HGB 7.7* 8.1* 7.7*  --   PLT 302 371 316  --   CREATININE 1.42* 1.34 1.33 1.38*   Estimated Creatinine Clearance: 50.8 mL/min (by C-G formula based on Cr of 1.38). No results for input(s): VANCOTROUGH, VANCOPEAK, VANCORANDOM, GENTTROUGH, GENTPEAK, GENTRANDOM, TOBRATROUGH, TOBRAPEAK, TOBRARND, AMIKACINPEAK, AMIKACINTROU, AMIKACIN in the last 72 hours.   Microbiology: Recent Results (from the past 720 hour(s))  Culture, respiratory (NON-Expectorated)     Status: None   Collection Time: 01/08/14 11:22 AM  Result Value Ref Range Status   Specimen Description TRACHEAL ASPIRATE  Final   Special Requests Normal  Final   Gram Stain   Final    FEW WBC PRESENT,BOTH PMN AND MONONUCLEAR RARE SQUAMOUS EPITHELIAL CELLS PRESENT NO ORGANISMS SEEN Performed at [facility]    Culture   Final    Non-Pathogenic Oropharyngeal-type Flora Isolated. Performed at [facility]    Report Status 01/10/2014 FINAL  Final  Clostridium Difficile by PCR     Status: None   Collection Time: 01/14/14 11:35 PM  Result Value Ref Range Status   C difficile by pcr NEGATIVE NEGATIVE Final  Culture, bal-quantitative     Status: None   Collection Time: 01/16/14  5:20 PM  Result Value Ref Range Status   Specimen Description  BRONCHIAL ALVEOLAR LAVAGE  Final   Special Requests NONE  Final   Gram Stain   Final    FEW WBC PRESENT,BOTH PMN AND MONONUCLEAR RARE SQUAMOUS EPITHELIAL CELLS PRESENT NO ORGANISMS SEEN Performed at [place]   Final    50,000 COLONIES/ML Performed at [facility]    Culture   Final    STAPHYLOCOCCUS AUREUS Note: RIFAMPIN AND GENTAMICIN SHOULD NOT BE USED AS SINGLE DRUGS FOR TREATMENT OF STAPH INFECTIONS. This organism DOES NOT demonstrate inducible Clindamycin resistance in vitro. Performed at [facility]    Report Status 01/20/2014 FINAL  Final   Organism ID, Bacteria STAPHYLOCOCCUS AUREUS  Final      Susceptibility   Staphylococcus aureus - MIC*    CLINDAMYCIN <=0.25 SENSITIVE Sensitive     ERYTHROMYCIN >=8 RESISTANT Resistant     GENTAMICIN <=0.5 SENSITIVE Sensitive     LEVOFLOXACIN >=8 RESISTANT Resistant     OXACILLIN 0.5 SENSITIVE Sensitive     PENICILLIN >=0.5 RESISTANT Resistant     RIFAMPIN <=0.5 SENSITIVE Sensitive     TRIMETH/SULFA <=10 SENSITIVE Sensitive     VANCOMYCIN 1 SENSITIVE Sensitive     TETRACYCLINE <=1 SENSITIVE Sensitive     MOXIFLOXACIN 2 RESISTANT Resistant     * STAPHYLOCOCCUS AUREUS  Culture, Urine     Status: None   Collection Time: 01/16/14  8:58 PM  Result Value Ref Range Status   Specimen Description URINE, SUPRAPUBIC  Final   Special Requests NONE  Final  Colony Count   Final    >=100,000 COLONIES/ML Performed at [facility]    Culture   Final    Multiple bacterial morphotypes present, none predominant. Suggest appropriate recollection if clinically indicated. Performed at [facility]    Report Status 01/17/2014 FINAL  Final  Culture, blood (routine x 2)     Status: None   Collection Time: 01/16/14 10:46 PM  Result Value Ref Range Status   Specimen Description BLOOD RIGHT ARM  Final   Special Requests BOTTLES DRAWN AEROBIC AND ANAEROBIC 10CC  Final   Culture   Final     NO GROWTH 5 DAYS Performed at [facility]    Report Status 01/23/2014 FINAL  Final  Culture, blood (routine x 2)     Status: None   Collection Time: 01/16/14 10:52 PM  Result Value Ref Range Status   Specimen Description BLOOD RIGHT HAND  Final   Special Requests BOTTLES DRAWN AEROBIC ONLY [place]  Final   Culture   Final    NO GROWTH 5 DAYS Performed at [facility]    Report Status 01/23/2014 FINAL  Final  Culture, respiratory (NON-Expectorated)     Status: None   Collection Time: 01/28/14 12:05 PM  Result Value Ref Range Status   Specimen Description TRACHEAL ASPIRATE  Final   Special Requests NONE  Final   Gram Stain   Final    FEW WBC PRESENT,BOTH PMN AND MONONUCLEAR RARE SQUAMOUS EPITHELIAL CELLS PRESENT RARE GRAM POSITIVE COCCI IN PAIRS Performed at [facility]    Culture   Final    FEW STAPHYLOCOCCUS AUREUS Note: RIFAMPIN AND GENTAMICIN SHOULD NOT BE USED AS SINGLE DRUGS FOR TREATMENT OF STAPH INFECTIONS. This organism DOES NOT demonstrate inducible Clindamycin resistance in vitro. Performed at [facility]    Report Status 02/01/2014 FINAL  Final   Organism ID, Bacteria STAPHYLOCOCCUS AUREUS  Final      Susceptibility   Staphylococcus aureus - MIC*    CLINDAMYCIN <=0.25 SENSITIVE Sensitive     ERYTHROMYCIN >=8 RESISTANT Resistant     GENTAMICIN <=0.5 SENSITIVE Sensitive     LEVOFLOXACIN >=8 RESISTANT Resistant     OXACILLIN 0.5 SENSITIVE Sensitive     PENICILLIN >=0.5 RESISTANT Resistant     RIFAMPIN <=0.5 SENSITIVE Sensitive     TRIMETH/SULFA <=10 SENSITIVE Sensitive     VANCOMYCIN 1 SENSITIVE Sensitive     TETRACYCLINE <=1 SENSITIVE Sensitive     MOXIFLOXACIN 2 RESISTANT Resistant     * FEW STAPHYLOCOCCUS AUREUS    Medical History: Past Medical History  Diagnosis Date  . Hypertension   . Hypercholesterolemia   . GERD (gastroesophageal reflux disease)   . Dyspnea on exertion   . Claudication in peripheral vascular disease    . PVD (peripheral vascular disease)   . S/P renal artery angioplasty 2009--- W/ STENT X1  TO LEFT COMMON ILIAC    . ED (erectile dysfunction)   . History of BPH   . Frequency of urination   . Urgency of urination   . Nocturia   . Chronic gouty arthritis FEET  . Diabetes mellitus     type II-- ORAL MED  . Prostate cancer SCHEDULED FOR RADIOACTIVE SEED IMPLANTS OF PROSTATE  05-18-2011    FOLLOWED BY DR [facility] AND DR Valere Dross  . CML (chronic myelocytic leukemia) FOLLOWED BY DR LEWIS ([place])    TAKES SPRYCEL PO MED FOR TX--  PT STATES STABLE  NO SYMPTOMS  .  Iron deficiency anemia   . Chronic renal insufficiency   . PAD (peripheral artery disease)   . Cardiovascular disease with arteriosclerosis   . Blood transfusion LAST TIME NOV 2012  --  X2  UNITS  (FOR ANEMIA AND THROMBOCYTOPENIA  . Leukocytosis   . Peripheral edema   . History of thrombocytopenia   . Heart murmur   . CHF (congestive heart failure) ADMISSION NOV 2012  [facility])  REQUESTED RECORDS    DIATOSLIC CHF WITH LEFT VENTRICULAR IMPAIRED RELAXATION-----   CARDIOLOGIST----  DR Dallie Piles  . Hypertension   . Arthritis   . History of angina   . Acute renal failure Admitted to [facility] 06/06/12 with hip fracture; creatinine 4.3, K 7.8. Aldactone and lisinopril stopped    Medications:  Anti-infectives    Start     Dose/Rate Route Frequency Ordered Stop   01/31/14 0045  vancomycin (VANCOCIN) IVPB 750 mg/150 ml premix  Status:  Discontinued     750 mg150 mL/hr over 60 Minutes Intravenous Every 12 hours 01/30/14 1235 02/01/14 1314   01/30/14 1245  vancomycin (VANCOCIN) 1,500 mg in sodium chloride 0.9 % 500 mL IVPB     1,500 mg250 mL/hr over 120 Minutes Intravenous  Once 01/30/14 1235 01/30/14 1541   01/21/14 2200  ceFAZolin (ANCEF) IVPB 2 g/50 mL premix     2 g100 mL/hr over 30 Minutes Intravenous 3 times per day 01/21/14 1603 01/25/14 2359   01/21/14 1600  ceFAZolin (ANCEF) IVPB 2 g/50 mL premix     2  g100 mL/hr over 30 Minutes Intravenous On call 01/19/14 0939 01/21/14 1658   01/21/14 0800  ceFAZolin (ANCEF) IVPB 1 g/50 mL premix  Status:  Discontinued     1 g100 mL/hr over 30 Minutes Intravenous 3 times per day 01/20/14 1656 01/21/14 1603   01/18/14 2000  vancomycin (VANCOCIN) IVPB 750 mg/150 ml premix  Status:  Discontinued     750 mg150 mL/hr over 60 Minutes Intravenous Every 12 hours 01/18/14 1304 01/20/14 1656   01/18/14 0900  ceFEPIme (MAXIPIME) 1 g in dextrose 5 % 50 mL IVPB  Status:  Discontinued     1 g100 mL/hr over 30 Minutes Intravenous Every 24 hours 01/17/14 1132 01/20/14 1656   01/18/14 0800  vancomycin (VANCOCIN) 1,250 mg in sodium chloride 0.9 % 250 mL IVPB  Status:  Discontinued     1,250 mg166.7 mL/hr over 90 Minutes Intravenous Every 24 hours 01/17/14 1130 01/18/14 1303   01/17/14 0930  vancomycin (VANCOCIN) IVPB 750 mg/150 ml premix  Status:  Discontinued     750 mg150 mL/hr over 60 Minutes Intravenous Every 12 hours 01/16/14 2056 01/17/14 1130   01/16/14 2130  vancomycin (VANCOCIN) 1,500 mg in sodium chloride 0.9 % 500 mL IVPB     1,500 mg250 mL/hr over 120 Minutes Intravenous  Once 01/16/14 2056 01/16/14 2330   01/16/14 2100  ceFEPIme (MAXIPIME) 1 g in dextrose 5 % 50 mL IVPB  Status:  Discontinued     1 g100 mL/hr over 30 Minutes Intravenous Every 12 hours 01/16/14 2056 01/17/14 1132   01/05/14 2200  ceFEPIme (MAXIPIME) 1 g in dextrose 5 % 50 mL IVPB     1 g100 mL/hr over 30 Minutes Intravenous Every 12 hours 01/05/14 1446 01/07/14 2156   01/01/14 1330  ceFEPIme (MAXIPIME) 1 g in dextrose 5 % 50 mL IVPB  Status:  Discontinued     1 g100 mL/hr over 30 Minutes Intravenous Every 24 hours 01/01/14 1235 01/05/14  1446   12/31/13 1400  vancomycin (VANCOCIN) IVPB 1000 mg/200 mL premix  Status:  Discontinued     1,000 mg200 mL/hr over 60 Minutes Intravenous Every 24 hours 12/25/2013 1319 12/31/13 0905   12/31/13 1200  ceFEPIme (MAXIPIME) 1 g in dextrose 5 % 50 mL IVPB  Status:   Discontinued     1 g100 mL/hr over 30 Minutes Intravenous Every 24 hours 12/15/2013 1906 12/31/13 1317   12/31/13 1000  vancomycin (VANCOCIN) 1,500 mg in sodium chloride 0.9 % 500 mL IVPB  Status:  Discontinued     1,500 mg250 mL/hr over 120 Minutes Intravenous Every 48 hours 12/31/13 0905 12/31/13 1317   01/10/2014 1330  vancomycin (VANCOCIN) IVPB 1000 mg/200 mL premix     1,000 mg200 mL/hr over 60 Minutes Intravenous  Once 01/08/2014 1315 12/22/2013 1605   12/23/2013 1300  ceFEPIme (MAXIPIME) 1 g in dextrose 5 % 50 mL IVPB     1 g100 mL/hr over 30 Minutes Intravenous  Once 12/25/2013 1248 12/29/2013 1343     Assessment: 74 year old male on vancomycin for pneumonia now with culture results and sensitivities of MSSA to change to Ancef with planned duration of 14 days total of therapy from time of vancomycin restart. WBC 14.2, SCr 1.38/est CrCl ~50. Tmax 99.2.   Goal of Therapy:  clinical resolution of infection  Plan:  Discontinue vancomycin. Change to Ancef 1g IV every 8 hours for total of 14 days of antibiotics.   Sloan Leiter, PharmD, BCPS Clinical Pharmacist [phone] 02/01/2014,1:15 PM

## 2014-02-01 NOTE — Progress Notes (Signed)
Occupational Therapy Treatment Patient Details Name: Dale Taylor MRN: [id number] DOB: August 13, 1940 Today's Date: 02/01/2014    History of present illness Dale Taylor is a 74 y.o. male presenting with acute renal failure according to patient's nursing facility. PMH is significant for hypertension, Type 2 diabetes, chronic diastolic heart failure, hyperkalemia, HLD, prostate cancer, iron deficiency anemia, A. fib with RVR. Intubated 12/22, Extubated 12/24. Re-intubated 12/29.   OT comments  Pt alert and participatory in UE exercises and grooming at bed level.  Pt tearful and stated he was tired of being in the be and dependent on the ventilator.  Will continue to follow.  Follow Up Recommendations  SNF;Supervision/Assistance - 24 hour    Equipment Recommendations       Recommendations for Other Services      Precautions / Restrictions Precautions Precautions: Fall Precaution Comments: vent, panda       Mobility Bed Mobility Overal bed mobility: Needs Assistance Bed Mobility: Rolling Rolling: +2 for physical assistance;Max assist         General bed mobility comments: reached for rail  Transfers                      Balance                                   ADL Overall ADL's : Needs assistance/impaired Eating/Feeding: NPO   Grooming: Wash/dry face;Oral care;Minimal assistance;Bed level Grooming Details (indicate cue type and reason): washed face, used tissues when tearful, oral care with toothette followed by use of  Yankauer suction.                                      Vision                     Perception     Praxis      Cognition   Behavior During Therapy:  (tearful) Overall Cognitive Status: Impaired/Different from baseline Area of Impairment: Following commands;Attention   Current Attention Level: Sustained    Following Commands: Follows one step commands with increased time     Problem Solving: Slow  processing;Requires verbal cues General Comments: Pt much more alert today.    Extremity/Trunk Assessment               Exercises General Exercises - Upper Extremity Shoulder Flexion: AAROM;Both;10 reps;Supine Shoulder ABduction: AAROM;Both;10 reps;Supine Shoulder Horizontal ABduction: AAROM;Both;10 reps;Supine Shoulder Horizontal ADduction: AAROM;Both;10 reps;Supine Elbow Flexion: AROM;Both;10 reps;Supine Elbow Extension: AROM;Both;10 reps;Supine Digit Composite Flexion: Strengthening;Both;10 reps;Supine Composite Extension: Strengthening;Both;10 reps;Supine   Shoulder Instructions       General Comments      Pertinent Vitals/ Pain       Pain Assessment: Faces Faces Pain Scale: Hurts even more Pain Location: head Pain Descriptors / Indicators: Aching Pain Intervention(s): RN gave pain meds during session;Monitored during session  Home Living                                          Prior Functioning/Environment              Frequency Min 2X/week     Progress Toward Goals  OT Goals(current goals can now be found in the care  plan section)  Progress towards OT goals: Progressing toward goals  Acute Rehab OT Goals Patient Stated Goal: to get [place] Discharge plan remains appropriate    Co-evaluation                 End of Session     Activity Tolerance Patient tolerated treatment well   Patient Left in bed;with call bell/phone within reach   Nurse Communication  (pt tearful, states he is tired of being in the bed)        Time: 4818-5909 OT Time Calculation (min): 23 min  Charges: OT General Charges $OT Visit: 1 Procedure OT Treatments $Self Care/Home Management : 8-22 mins $Therapeutic Exercise: 8-22 mins  Malka So 02/01/2014, 10:03 AM  [phone]

## 2014-02-01 NOTE — Progress Notes (Signed)
Speech Language Pathology Treatment: Nada Boozer Speaking valve  Patient Details Name: Dale Taylor MRN: [id number] DOB: 1940/05/03 Today's Date: 02/01/2014 Time: 5366-4403 SLP Time Calculation (min) (ACUTE ONLY): 24 min  Assessment / Plan / Recommendation Clinical Impression  Pt seen for in-line PMV trials alongside RT, who managed ventilator settings. Adequate drop in expiratory volume and peak pressure as well as audible phonation all indicative of upper airway patency. Initial drop in SpO2 to 85% was managed by increasing inspiratory volume to return peak pressure to baseline level (30-31). PEEP was decreased from 5 to 3. Pt tolerated PMSV for 22 minutes with VS stable and adequate, deep phonation achieved. Pt overall exhibited decreased effort as compared to previous date, however breathing became more labored as time passed, and the valve was ultimately removed to allow pt rest.  Of note, with valve in place pt was communicating his desire to "go to heaven." Pt was requesting to be removed from the ventilator, understanding the implications, endorsing chronic pain and feeling tired. RN made aware and paged primary MD services to attempt to facilitate communication regarding [place] and pt wishes with valve still in place. Unfortunately valve had to be removed due to fatigue, however SLP provided contact information so that valve can hopefully be placed again when MD is available.   HPI HPI: 74 y.o. male admitted with respiratory failure; tracheostomy, acute on chronic renal failure, right lung collapse, HCAP and UTI. The patient has a history of prostate cancer, PAF, CAD.  Intubated 12/20-12/24; reintubated12/29; trach 1/5; continues on vent. Orders received for inline PMV.  Pt meets criteria for inline valve trials.       Pertinent Vitals Pain Assessment:  ("pain all over", RN aware) Faces Pain Scale: Hurts even more Pain Location: head Pain Descriptors / Indicators: Aching Pain Intervention(s):  RN gave pain meds during session;Monitored during session  SLP Plan  Continue with current plan of care    Recommendations        Patient may use Passy-Muir Speech Valve: with SLP only (and RT) PMSV Supervision: Full       Follow up Recommendations: Skilled Nursing facility Plan: Continue with current plan of care    GO     Germain Osgood, M.A. CCC-SLP [phone]  Germain Osgood 02/01/2014, 11:18 AM

## 2014-02-01 NOTE — Progress Notes (Signed)
PULMONARY / CRITICAL CARE MEDICINE   Name: Dale Taylor MRN: [id number] DOB: October 21, 1940    ADMISSION DATE:  01/03/2014 CONSULTATION DATE:  12/20  REFERRING MD :  FPTS  CHIEF COMPLAINT:  Respiratory failure    INITIAL PRESENTATION:  74 yo male CRI, prostate ca admitted with A on C renal failure, hyperkalemia, HCAP, UTI.    STUDIES/EVENTS:  12/20 Admitted to [facility] wit dx of PNA 12/20 Transferred to ICU with respiratory acidosis, hypersomnolence. PCCM consultation and assumption of care. Treated initially with NPPV 12/21 PAF with RVR. Amiodarone started. Converted to NSR. Cards consult obtained. Recommended continuing current rx. No anticoagulation given high risk 12/22 Increased dyspnea. White out of R hemithorax on CXR. Intubated. Copious purulent secretions from ETT.  12/24 Extubated 12/25 recurrent mucus plugging 12/27 Bronchoscopy for removal of airway secretions (Sood). Bronchomalacia noted 12/29 Re-intubation  01/02 Family conference. Discussed options and pt wished to proceed with trach tube placement and likely LTACH 01/04 Hyper K+, brady, d/ced lopressor. Treated hyperK+. 01/05 Recurrent R lung collapse by CXR. Tracheostomy tube placed. 01/06 Increasing left lung interstitial opacities suspicious for edema, improved aeration RLL. No pnx. 01/06 Recurrent R lung collapse. Repeat bronchoscopy with removal of airway secretions. BAL specimen sent for cytology > atypical cells in background of extensive inflammation 01/07 Transferred to vent SDU bed 01/11 PEG placed 01/14 start PMV trials 1/18 minimally tolerates inline speaking valve trials.  STUDIES: 12/21 TTE >>   EF 40 to 32%, grade 1 diastolic dysfx 44/01 Renal US >>   No hydronephrosis 12/23 CT chest >>  extensive pleural thickening on R. R lung with reduced volume. Bilateral R>L AS dz  INDWELLING DEVICES:: ETT 12/22 >> 12/24, 12/29 >> 01/06 Trach tube 01/06 >> 1/21 changed to 6 XLT distal >>> PEG 1/11 >> RT PICC 1/14  >>  MICRO DATA: Resp (BAL) 01/06 >> MSSA Sputum 1/18>>>sa>>>  ANTIMICROBIALS:  Vanc 12/20 >> 12/22, 01/06 >> 1/10 Cefepime 12/20 >> 12/28, 01/06 >> 1/10 Ancef 1/11 >>1/15  vanc 1/20>>>  SUBJECTIVE: Comfortable    VITAL SIGNS: Temp:  [98.5 F (36.9 C)-99.2 F (37.3 C)] 99.2 F (37.3 C) (01/22 0809) Pulse Rate:  [70-78] 78 (01/22 0809) Resp:  [15-25] 20 (01/22 0809) BP: (129-160)/(49-77) 153/58 mmHg (01/22 0809) SpO2:  [92 %-100 %] 96 % (01/22 0809) FiO2 (%):  [40 %] 40 % (01/22 1126) Weight:  [90.3 kg (199 lb 1.2 oz)] 90.3 kg (199 lb 1.2 oz) (01/22 0449)  Vent Mode:  [-] PRVC FiO2 (%):  [40 %] 40 % Set Rate:  [14 bmp] 14 bmp Vt Set:  [600 mL] 600 mL PEEP:  [5 cmH20] 5 cmH20 Plateau Pressure:  [18 cmH20-28 cmH20] 28 cmH20   INTAKE / OUTPUT:  Intake/Output Summary (Last 24 hours) at 02/01/14 1231 Last data filed at 02/01/14 0932  Gross per 24 hour  Intake   1775 ml  Output    650 ml  Net   1125 ml    PHYSICAL EXAMINATION: General: Calm, comfortable in bed on vent Neuro : Follows commands, no focal def. Interactive  HEENT: Skin erosion at inferior border of trach with tan secretions-->secretions better  Cardiovascular: Regular Lungs: scattered rhonchi; improved air movement  Abdomen:  Obese, soft, +bs- active, suprapubic catheter, PEG c/d  Ext:  Warm, no edema, compression hose in place  LABS:  Recent Labs Lab 01/30/14 0900  01/31/14 0658  01/31/14 2244 02/01/14 0258 02/01/14 0600  NA 133*  --  136  --   --   --  134*  K 6.7*  < > 5.0  < > 5.0 4.8 4.7  CL 98  --  100  --   --   --  96  CO2 26  --  30  --   --   --  31  BUN 57*  --  57*  --   --   --  58*  CREATININE 1.34  --  1.33  --   --   --  1.38*  GLUCOSE 134*  --  177*  --   --   --  139*  < > = values in this interval not displayed.  Recent Labs Lab 01/30/14 0545 01/30/14 0900 01/31/14 0658  HGB 7.7* 8.1* 7.7*  HCT 24.2* 25.4* 24.2*  WBC 16.8* 19.9* 14.2*  PLT 302 371 316    No  results found.   ASSESSMENT / PLAN:   Acute on chronic hypercarbic respiratory failure, in setting of HCAP and purulent bronchitis (MSSA). 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 Recurrent 1/16, better 1/17 with mucus plug removal.  Chronic R pleural dz. Tracheostomy status >> stoma erosion. SA PNA >he now desires to come off vent. Says he is sick of suffering.  Plan:   Continue vent support for now, awaiting family Continue nebulized BDs  Continue PT efforts Continue in-line speaking valve trials if he changes his mind and wants to cont vent support Started vanc 1/20, change to ancef-->14d   Hx of CAD, HTN, HLD, CHF, PVD. PAF this hospitalization. Deemed poor candidate for chronic anticoagulation. Plan:  Continue ASA, lipitor Holding clopidogrel  Continue amiodarone >> changed to 200 mg daily on 1/15 Continue lopressor   Acute on chronic renal failure (baseline scr 1.35)-->stable on 1/20 Persistent Hypernatremia-->improved BPH, prostate cancer. Hyperkalemia, slow improvement but not at goal Plan:   Holding Proscar, flomax, tasigna for now Repeat K every 4 hrs until K < 4.5  Tracheostomy associated dysphagia. Hx of GERD. S/p PEG 1/11. Plan:   SUP: Famotidine Resumed Tube feeds.  Acute metabolic encephalopathy, resolved. Chronic pain > improved 1/19 with addition of fentanyl patch and scheduled Apap as advised by Palliative  Chronic HA- seems to be occipital, radiating bilat to vertex. Severe deconditioning. 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 Plan:  Lovenox for DVT prevention Monitor CBC intermittently  DM 2, controlled. Hx of gout. P:   Continue SSI  Discussion  Looks better. He now shares however that he does not want to continue full support any longer. He says he is  sick of suffering and understands that he may die sooner with out the vent. This is almost completely the opposite of his desire 48 hrs ago... I have contacted his family who desire to be here. Will plan on stopping vent support soon unless he has a change of heart after talking with them. If his desire remains the same will transition to more comfort oriented approach after family arrives   Erick Colace ACNP-BC [place] Pager # [phone] OR # [phone] if no answer   Attending Note:  I have examined patient, reviewed labs, studies and notes. I have discussed the case with [name], and I agree with the data and plans as amended above. Mr Saiki told his nurse this am, and then repeated clearly to [name], that he doesn't want  to suffer, doesn't want to continue MV any longer. Both we and his family were surprised by but are accepting of this decision. I will plan to support him with MV long enough for his family to come to be with him when he is transitioned off MV. At that point we will concentrate on his comfort. Independent critical care time is 45 minutes.   Baltazar Apo, MD, PhD 02/01/2014, 1:22 PM [facility] [phone] or if no answer [phone]

## 2014-02-02 LAB — GLUCOSE, CAPILLARY
GLUCOSE-CAPILLARY: 142 mg/dL — AB (ref 70–99)
GLUCOSE-CAPILLARY: 147 mg/dL — AB (ref 70–99)
GLUCOSE-CAPILLARY: 156 mg/dL — AB (ref 70–99)
Glucose-Capillary: 157 mg/dL — ABNORMAL HIGH (ref 70–99)
Glucose-Capillary: 110 mg/dL — ABNORMAL HIGH (ref 70–99)
Glucose-Capillary: 132 mg/dL — ABNORMAL HIGH (ref 70–99)

## 2014-02-02 LAB — POTASSIUM
POTASSIUM: 5.6 mmol/L — AB (ref 3.5–5.1)
Potassium: 5.1 mmol/L (ref 3.5–5.1)
Potassium: 4.9 mmol/L (ref 3.5–5.1)
Potassium: 4.9 mmol/L (ref 3.5–5.1)

## 2014-02-02 NOTE — Progress Notes (Signed)
PULMONARY / CRITICAL CARE MEDICINE   Name: Dale Taylor MRN: [id number] DOB: 1940-08-14    ADMISSION DATE:  12/13/2013 CONSULTATION DATE:  12/20  REFERRING MD :  FPTS  CHIEF COMPLAINT:  Respiratory failure    INITIAL PRESENTATION:  74 yo male CRI, prostate ca admitted with A on C renal failure, hyperkalemia, HCAP, UTI.    STUDIES/EVENTS:  12/20 Admitted to [facility] wit dx of PNA 12/20 Transferred to ICU with respiratory acidosis, hypersomnolence. PCCM consultation and assumption of care. Treated initially with NPPV 12/21 PAF with RVR. Amiodarone started. Converted to NSR. Cards consult obtained. Recommended continuing current rx. No anticoagulation given high risk 12/22 Increased dyspnea. White out of R hemithorax on CXR. Intubated. Copious purulent secretions from ETT.  12/24 Extubated 12/25 recurrent mucus plugging 12/27 Bronchoscopy for removal of airway secretions (Sood). Bronchomalacia noted 12/29 Re-intubation  01/02 Family conference. Discussed options and pt wished to proceed with trach tube placement and likely LTACH 01/04 Hyper K+, brady, d/ced lopressor. Treated hyperK+. 01/05 Recurrent R lung collapse by CXR. Tracheostomy tube placed. 01/06 Increasing left lung interstitial opacities suspicious for edema, improved aeration RLL. No pnx. 01/06 Recurrent R lung collapse. Repeat bronchoscopy with removal of airway secretions. BAL specimen sent for cytology > atypical cells in background of extensive inflammation 01/07 Transferred to vent SDU bed 01/11 PEG placed 01/14 start PMV trials 1/18 minimally tolerates inline speaking valve trials.  STUDIES: 12/21 TTE >>   EF 40 to 14%, grade 1 diastolic dysfx 97/02 Renal US >>   No hydronephrosis 12/23 CT chest >>  extensive pleural thickening on R. R lung with reduced volume. Bilateral R>L AS dz  INDWELLING DEVICES:: ETT 12/22 >> 12/24, 12/29 >> 01/06 Trach tube 01/06 >> 1/21 changed to 6 XLT distal >>> PEG 1/11 >> RT PICC 1/14  >>  MICRO DATA: Resp (BAL) 01/06 >> MSSA Sputum 1/18> MSSA   ANTIMICROBIALS:  Vanc 12/20 >> 12/22, 01/06 >> 1/10 Cefepime 12/20 >> 12/28, 01/06 >> 1/10 Ancef 1/11 >>1/15  vanc 1/20> 1/22 Ancef 1/22 > 14 days planned  SUBJECTIVE: Comfortable    VITAL SIGNS: Temp:  [98 F (36.7 C)-98.9 F (37.2 C)] 98.1 F (36.7 C) (01/23 0829) Pulse Rate:  [65-80] 65 (01/23 0829) Resp:  [7-32] 14 (01/23 0829) BP: (129-164)/(55-71) 129/57 mmHg (01/23 0829) SpO2:  [94 %-100 %] 96 % (01/23 0829) FiO2 (%):  [40 %] 40 % (01/23 0829) Weight:  [199 lb 11.8 oz (90.6 kg)] 199 lb 11.8 oz (90.6 kg) (01/23 0456)  Vent Mode:  [-] PRVC FiO2 (%):  [40 %] 40 % Set Rate:  [14 bmp] 14 bmp Vt Set:  [600 mL] 600 mL PEEP:  [5 cmH20] 5 cmH20 Plateau Pressure:  [26 cmH20-28 cmH20] 26 cmH20   INTAKE / OUTPUT:  Intake/Output Summary (Last 24 hours) at 02/02/14 0959 Last data filed at 02/02/14 0913  Gross per 24 hour  Intake   1740 ml  Output   1950 ml  Net   -210 ml    PHYSICAL EXAMINATION: General: Calm, comfortable in bed on vent Neuro : Follows commands, no focal def., nods appropriately   HEENT: Skin erosion at inferior border of trach   Cardiovascular: Regular Lungs: scattered rhonchi; improved air movement  Abdomen:  Obese, soft, +bs- active, suprapubic catheter, PEG c/d  Ext:  Warm, no edema, compression hose in place  LABS:  Recent Labs Lab 01/30/14 0900  01/31/14 0658  02/01/14 0600  02/01/14 2242 02/02/14 0327 02/02/14 0619  NA 133*  --  136  --  134*  --   --   --   --   K 6.7*  < > 5.0  < > 4.7  < > 4.8 5.1 4.9  CL 98  --  100  --  96  --   --   --   --   CO2 26  --  30  --  31  --   --   --   --   BUN 57*  --  57*  --  58*  --   --   --   --   CREATININE 1.34  --  1.33  --  1.38*  --   --   --   --   GLUCOSE 134*  --  177*  --  139*  --   --   --   --   < > = values in this interval not displayed.  Recent Labs Lab 01/30/14 0545 01/30/14 0900 01/31/14 0658  HGB 7.7*  8.1* 7.7*  HCT 24.2* 25.4* 24.2*  WBC 16.8* 19.9* 14.2*  PLT 302 371 316    No results found.   ASSESSMENT / PLAN:   Acute on chronic hypercarbic respiratory failure, in setting of HCAP and purulent bronchitis (MSSA). 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 Recurrent 1/16, better 1/17 with mucus plug removal.  Chronic R pleural dz. Tracheostomy status >> stoma erosion. SA PNA >he now desires to come off vent. Says he is sick of suffering.  Plan:   Continue vent support for now, awaiting family Continue nebulized BDs  Continue PT efforts Continue in-line speaking valve trials if he changes his mind and wants to cont vent support Started vanc 1/20, changed to ancef-1/22 and plan 14 days total   Hx of CAD, HTN, HLD, CHF, PVD. PAF this hospitalization. Deemed poor candidate for chronic anticoagulation. Plan:  Continue ASA, lipitor Holding clopidogrel  Continue amiodarone >> changed to 200 mg daily on 1/15 Continue lopressor   Acute on chronic renal failure (baseline scr 1.35)-->stable on 1/20 Persistent Hypernatremia-->improved BPH, prostate cancer. Hyperkalemia, slow improvement but not at goal Plan:   Holding Proscar, flomax, tasigna for now Repeat K every 4 hrs until K < 4.5  Tracheostomy associated dysphagia. Hx of GERD. S/p PEG 1/11. Plan:   SUP: Famotidine Resumed Tube feeds.  Acute metabolic encephalopathy, resolved. Chronic pain > improved 1/19 with addition of fentanyl patch and scheduled Apap as advised by Palliative  Chronic HA- seems to be occipital, radiating bilat to vertex. Severe deconditioning. 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 Plan:  Lovenox for DVT prevention Monitor CBC intermittently  DM 2, controlled. Hx of gout. P:   Continue  SSI     no family at bedside, plan is to get their buy in and then grant pt wish to wean off vent with opioids to control dyspnea   Discussed with nursing, no fm likely today due to weather issues, so no change rx for now   [name], MD Pulmonary and [place] [phone] After 5:30 PM or weekends, call [phone]

## 2014-02-03 LAB — GLUCOSE, CAPILLARY
GLUCOSE-CAPILLARY: 116 mg/dL — AB (ref 70–99)
GLUCOSE-CAPILLARY: 122 mg/dL — AB (ref 70–99)
Glucose-Capillary: 112 mg/dL — ABNORMAL HIGH (ref 70–99)
Glucose-Capillary: 112 mg/dL — ABNORMAL HIGH (ref 70–99)
Glucose-Capillary: 152 mg/dL — ABNORMAL HIGH (ref 70–99)
Glucose-Capillary: 165 mg/dL — ABNORMAL HIGH (ref 70–99)

## 2014-02-03 NOTE — Progress Notes (Signed)
Family all left pt's room with no request to speak with doctor after nurse reinforcing to let her know if family wanted to speak with doctor and what about as Dr. Melvyn Novas left pt progress note stating awaiting family all together and a meeting to speak with MD. Pt's daughter called from outside the hospital after leaving now requesting to speak with MD. Hulen Skains and notified Dr. Viona Gilmore PCCM doctor and gave number daughter left to call. Daughter's name is Cotenia and number is [phone].

## 2014-02-03 NOTE — Progress Notes (Signed)
Took over care of pt at 1400 as day  Nurse had to leave. Pt is on continuous vent. Pt's family all here. Nurse asked family if they wished to speak with a doctor and they stated no not at this time. Pt's blood pressure is soft. Pt is emotional and crying at times and communicating with hand gestures and body language with his family. Suctioned pt and he has a large amount of very thick yellow mucous in his trach and inline. Changed out inner canula and still could here rhonchi and mucous. Requested respiratory to come and change inline and pt receiving a breathing treatment. Pt is doing better now. Will monitor.

## 2014-02-03 NOTE — Progress Notes (Signed)
Call from daughter Cottina =- she indicated desire for terminal wean when all family gather. It will be February 09, 2014 aftrer 12pm   Dr. Brand Males, M.D., [facility].C.P Pulmonary and Critical Care Medicine Staff Physician [place] [facility] Pager: [phone], If no answer or between  15:00h - 7:00h: call [phone]  02/03/2014 5:44 PM

## 2014-02-03 NOTE — Progress Notes (Signed)
PULMONARY / CRITICAL CARE MEDICINE   Name: Dale Taylor MRN: [id number] DOB: Jul 14, 1940    ADMISSION DATE:  12/13/2013 CONSULTATION DATE:  12/20  REFERRING MD :  FPTS  CHIEF COMPLAINT:  Respiratory failure    INITIAL PRESENTATION:  74 yo male CRI, prostate ca admitted with A on C renal failure, hyperkalemia, HCAP, UTI.    STUDIES/EVENTS:  12/20 Admitted to [facility] wit dx of PNA 12/20 Transferred to ICU with respiratory acidosis, hypersomnolence. PCCM consultation and assumption of care. Treated initially with NPPV 12/21 PAF with RVR. Amiodarone started. Converted to NSR. Cards consult obtained. Recommended continuing current rx. No anticoagulation given high risk 12/22 Increased dyspnea. White out of R hemithorax on CXR. Intubated. Copious purulent secretions from ETT.  12/24 Extubated 12/25 recurrent mucus plugging 12/27 Bronchoscopy for removal of airway secretions (Sood). Bronchomalacia noted 12/29 Re-intubation  01/02 Family conference. Discussed options and pt wished to proceed with trach tube placement and likely LTACH 01/04 Hyper K+, brady, d/ced lopressor. Treated hyperK+. 01/05 Recurrent R lung collapse by CXR. Tracheostomy tube placed. 01/06 Increasing left lung interstitial opacities suspicious for edema, improved aeration RLL. No pnx. 01/06 Recurrent R lung collapse. Repeat bronchoscopy with removal of airway secretions. BAL specimen sent for cytology > atypical cells in background of extensive inflammation 01/07 Transferred to vent SDU bed 01/11 PEG placed 01/14 start PMV trials 1/18 minimally tolerates inline speaking valve trials.  STUDIES: 12/21 TTE >>   EF 40 to 03%, grade 1 diastolic dysfx 47/42 Renal US >>   No hydronephrosis 12/23 CT chest >>  extensive pleural thickening on R. R lung with reduced volume. Bilateral R>L AS dz  INDWELLING DEVICES:: ETT 12/22 >> 12/24, 12/29 >> 01/06 Trach tube 01/06 >> 1/21 changed to 6 XLT distal >>> PEG 1/11 >> RT PICC 1/14  >>  MICRO DATA: Resp (BAL) 01/06 >> MSSA Sputum 1/18> MSSA   ANTIMICROBIALS:  Vanc 12/20 >> 12/22, 01/06 >> 1/10 Cefepime 12/20 >> 12/28, 01/06 >> 1/10 Ancef 1/11 >>1/15  vanc 1/20> 1/22 Ancef 1/22 > 14 days planned  SUBJECTIVE: Comfortable on present vent settings/ sleeping/ no problems overnight per nursing    VITAL SIGNS: Temp:  [98 F (36.7 C)-98.7 F (37.1 C)] 98.3 F (36.8 C) (01/24 0900) Pulse Rate:  [58-73] 58 (01/24 0900) Resp:  [11-59] 16 (01/24 0900) BP: (103-158)/(55-71) 103/63 mmHg (01/24 0900) SpO2:  [91 %-99 %] 95 % (01/24 0900) FiO2 (%):  [40 %] 40 % (01/24 0827) Weight:  [205 lb 14.6 oz (93.4 kg)] 205 lb 14.6 oz (93.4 kg) (01/24 0600)  Vent Mode:  [-] PRVC FiO2 (%):  [40 %] 40 % Set Rate:  [14 bmp] 14 bmp Vt Set:  [600 mL] 600 mL PEEP:  [5 cmH20] 5 cmH20 Plateau Pressure:  [21 cmH20-31 cmH20] 21 cmH20   INTAKE / OUTPUT:  Intake/Output Summary (Last 24 hours) at 02/03/14 5956 Last data filed at 02/03/14 0800  Gross per 24 hour  Intake   3235 ml  Output   1000 ml  Net   2235 ml    PHYSICAL EXAMINATION: General: Calm, comfortable in bed on vent Neuro : asleep HEENT: Skin erosion at inferior border of trach   Cardiovascular: Regular Lungs: scattered rhonchi; improved air movement  Abdomen:  Obese, soft, +bs- active, suprapubic catheter, PEG c/d  Ext:  Warm, no edema, compression hose in place  LABS:  Recent Labs Lab 01/30/14 0900  01/31/14 0658  02/01/14 0600  02/02/14 0619 02/02/14 1110 02/02/14 1458  NA 133*  --  136  --  134*  --   --   --   --   K 6.7*  < > 5.0  < > 4.7  < > 4.9 4.9 5.6*  CL 98  --  100  --  96  --   --   --   --   CO2 26  --  30  --  31  --   --   --   --   BUN 57*  --  57*  --  58*  --   --   --   --   CREATININE 1.34  --  1.33  --  1.38*  --   --   --   --   GLUCOSE 134*  --  177*  --  139*  --   --   --   --   < > = values in this interval not displayed.  Recent Labs Lab 01/30/14 0545 01/30/14 0900  01/31/14 0658  HGB 7.7* 8.1* 7.7*  HCT 24.2* 25.4* 24.2*  WBC 16.8* 19.9* 14.2*  PLT 302 371 316    No results found.   ASSESSMENT / PLAN:   Acute on chronic hypercarbic respiratory failure, in setting of HCAP and purulent bronchitis (MSSA). 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 Recurrent 1/16, better 1/17 with mucus plug removal.  Chronic R pleural dz. Tracheostomy status >> stoma erosion. SA PNA >he now desires to come off vent. Says he is sick of suffering.  Plan:   Continue vent support for now, awaiting family Continue nebulized BDs  Continue PT efforts Continue in-line speaking valve trials if he changes his mind and wants to cont vent support Started vanc 1/20, changed to ancef-1/22 and plan 14 days total   Hx of CAD, HTN, HLD, CHF, PVD. PAF this hospitalization. Deemed poor candidate for chronic anticoagulation. Plan:  Continue ASA, lipitor Holding clopidogrel  Continue amiodarone >> changed to 200 mg daily on 1/15 Continue lopressor   Acute on chronic renal failure (baseline scr 1.35)-->stable on 1/20 Persistent Hypernatremia-->improved BPH, prostate cancer. Hyperkalemia variable/mild/ no treatment needed  Plan:   Holding Proscar, flomax, tasigna for now    Tracheostomy associated dysphagia. Hx of GERD. S/p PEG 1/11. Plan:   SUP: Famotidine Resumed Tube feeds.  Acute metabolic encephalopathy, resolved. Chronic pain > improved 1/19 with addition of fentanyl patch and scheduled Apap as advised by Palliative  Chronic HA- seems to be occipital, radiating bilat to vertex. Severe deconditioning. 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 Plan:  Lovenox for DVT prevention Monitor CBC intermittently  DM 2, controlled. Hx of gout. P:   Continue  SSI    Awaiting fm to arrive re terminal weaning - in meantime no escalation of care anticipated    [name], MD Pulmonary and [place] [phone] After 5:30 PM or weekends, call [phone]

## 2014-02-04 LAB — GLUCOSE, CAPILLARY
Glucose-Capillary: 127 mg/dL — ABNORMAL HIGH (ref 70–99)
Glucose-Capillary: 109 mg/dL — ABNORMAL HIGH (ref 70–99)
Glucose-Capillary: 142 mg/dL — ABNORMAL HIGH (ref 70–99)

## 2014-02-04 MED ORDER — MORPHINE SULFATE 25 MG/ML IV SOLN
10.0000 mg/h | INTRAVENOUS | Status: DC
  Administered 2014-02-04: 10 mg/h via INTRAVENOUS
  Filled 2014-02-04: qty 10

## 2014-02-04 MED ORDER — MORPHINE BOLUS VIA INFUSION
5.0000 mg | INTRAVENOUS | Status: DC | PRN
  Administered 2014-02-04: 5 mg via INTRAVENOUS
  Administered 2014-02-04 (×2): 10 mg via INTRAVENOUS
  Filled 2014-02-04 (×4): qty 20

## 2014-02-11 NOTE — Progress Notes (Addendum)
Pt has no respirations no breathing  No heart beat when auscultated family at bedside  2nd [place] verified above

## 2014-02-11 NOTE — Progress Notes (Signed)
Resp therapy here now d/c vent all family present at bedside

## 2014-02-11 NOTE — Progress Notes (Signed)
Pt's niece here to see pt. Took robe home that was pt's only personal belonging left. Family wants dentures to go to [place] home with pt.

## 2014-02-11 NOTE — Clinical Social Work Note (Signed)
CSW informed that patient has changed their mind and is now considering palliative care.  Final disposition to be determined at 2pm with family at bedside.  CSW will continue to follow and will keep Vent SNF facility updated to patient decision.  Domenica Reamer, [place] Social Worker [phone]

## 2014-02-11 NOTE — Progress Notes (Signed)
Long discussion w/ family. Goals of care verified.  Plan Will start morphine gtt, then d/c vent from room.   Erick Colace ACNP-BC [place] Pager # [phone] OR # [phone] if no answer

## 2014-02-11 NOTE — Progress Notes (Signed)
Rep from [place] here. Transported pt to [facility] to sign pt out. Dentures with pt in his mouth

## 2014-02-11 NOTE — Progress Notes (Signed)
PT Cancellation Note  Patient Details Name: Dale Taylor MRN: [id number] DOB: June 12, 1940   Cancelled Treatment:     Patient for comfort measures, compassionate withdrawal from ventilator support. No further acute PT needs, PT to sign off.   [name] 02/19/14, 3:57 PM Alben Deeds, [place] DPT  [phone]

## 2014-02-11 NOTE — Progress Notes (Signed)
Patient taken off and disconnected from ventilator per Withdrawal orders. [place]ine has been given and will be monitored by RN at this time.  Patient is comfortable at this time.

## 2014-02-11 NOTE — Progress Notes (Signed)
Started MS gtt at 10cc/hr with a 10 mg Bolus. Family and Chaplain at bedside

## 2014-02-11 NOTE — Progress Notes (Signed)
PULMONARY / CRITICAL CARE MEDICINE   Name: Dale Taylor MRN: [id number] DOB: [birth year]    ADMISSION DATE:  12/15/2013 CONSULTATION DATE:  12/20  REFERRING MD :  FPTS  CHIEF COMPLAINT:  Respiratory failure    INITIAL PRESENTATION:  74 yo male CRI, prostate ca admitted with A on C renal failure, hyperkalemia, HCAP, UTI.    STUDIES/EVENTS:  12/20 Admitted to [facility] wit dx of PNA 12/20 Transferred to ICU with respiratory acidosis, hypersomnolence. PCCM consultation and assumption of care. Treated initially with NPPV 12/21 PAF with RVR. Amiodarone started. Converted to NSR. Cards consult obtained. Recommended continuing current rx. No anticoagulation given high risk 12/22 Increased dyspnea. White out of R hemithorax on CXR. Intubated. Copious purulent secretions from ETT.  12/24 Extubated 12/25 recurrent mucus plugging 12/27 Bronchoscopy for removal of airway secretions (Sood). Bronchomalacia noted 12/29 Re-intubation  01/02 Family conference. Discussed options and pt wished to proceed with trach tube placement and likely LTACH 01/04 Hyper K+, brady, d/ced lopressor. Treated hyperK+. 01/05 Recurrent R lung collapse by CXR. Tracheostomy tube placed. 01/06 Increasing left lung interstitial opacities suspicious for edema, improved aeration RLL. No pnx. 01/06 Recurrent R lung collapse. Repeat bronchoscopy with removal of airway secretions. BAL specimen sent for cytology > atypical cells in background of extensive inflammation 01/07 Transferred to vent SDU bed 01/11 PEG placed 01/14 start PMV trials 1/18 minimally tolerates inline speaking valve trials.  STUDIES: 12/21 TTE >>   EF 40 to 70%, grade 1 diastolic dysfx 62/37 Renal US >>   No hydronephrosis 12/23 CT chest >>  extensive pleural thickening on R. R lung with reduced volume. Bilateral R>L AS dz  INDWELLING DEVICES:: ETT 12/22 >> 12/24, 12/29 >> 01/06 Trach tube 01/06 >> 1/21 changed to 6 XLT distal >>> PEG 1/11 >> RT PICC 1/14  >>  MICRO DATA: Resp (BAL) 01/06 >> MSSA Sputum 1/18> MSSA   ANTIMICROBIALS:  Vanc 12/20 >> 12/22, 01/06 >> 1/10 Cefepime 12/20 >> 12/28, 01/06 >> 1/10 Ancef 1/11 >>1/15  vanc 1/20> 1/22 Ancef 1/22 > 14 days planned  SUBJECTIVE: He reports pain to me  VITAL SIGNS: Temp:  [97.7 F (36.5 C)-99.2 F (37.3 C)] 97.7 F (36.5 C) (01/25 0754) Pulse Rate:  [54-94] 59 (01/25 1015) Resp:  [0-25] 25 (01/25 0830) BP: (84-123)/(57-77) 123/59 mmHg (01/25 1015) SpO2:  [92 %-97 %] 93 % (01/25 0830) FiO2 (%):  [40 %] 40 % (01/25 0830) Weight:  [93.4 kg (205 lb 14.6 oz)] 93.4 kg (205 lb 14.6 oz) (01/25 0500)  Vent Mode:  [-] PRVC FiO2 (%):  [40 %] 40 % Set Rate:  [14 bmp] 14 bmp Vt Set:  [600 mL] 600 mL PEEP:  [5 cmH20] 5 cmH20 Plateau Pressure:  [26 cmH20] 26 cmH20   INTAKE / OUTPUT:  Intake/Output Summary (Last 24 hours) at 2014-02-25 1114 Last data filed at 2014-02-25 1100  Gross per 24 hour  Intake   1765 ml  Output   1450 ml  Net    315 ml    PHYSICAL EXAMINATION: General:appears to have pain Neuro : follows commands, moves uper ext HEENT: Skin erosion at inferior border of trach - mild, no sig dc  Cardiovascular: Regular s1 s2 rr Lungs: scattered rhonchi unchanged Abdomen:  Obese, soft, +bs- active, suprapubic catheter, PEG c/d  Ext:  Warm, no edema, compression hose in place  LABS:  Recent Labs Lab 01/30/14 0900  01/31/14 0658  02/01/14 0600  02/02/14 0619 02/02/14 1110 02/02/14 1458  NA 133*  --  136  --  134*  --   --   --   --   K 6.7*  < > 5.0  < > 4.7  < > 4.9 4.9 5.6*  CL 98  --  100  --  96  --   --   --   --   CO2 26  --  30  --  31  --   --   --   --   BUN 57*  --  57*  --  58*  --   --   --   --   CREATININE 1.34  --  1.33  --  1.38*  --   --   --   --   GLUCOSE 134*  --  177*  --  139*  --   --   --   --   < > = values in this interval not displayed.  Recent Labs Lab 01/30/14 0545 01/30/14 0900 01/31/14 0658  HGB 7.7* 8.1* 7.7*  HCT 24.2*  25.4* 24.2*  WBC 16.8* 19.9* 14.2*  PLT 302 371 316    No results found.   ASSESSMENT / PLAN:   Acute on chronic hypercarbic respiratory failure, in setting of HCAP and purulent bronchitis (MSSA). 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 Recurrent 1/16, better 1/17 with mucus plug removal.  Chronic R pleural dz. Tracheostomy status >> stoma erosion. SA PNA >he now desires to come off vent. Says he is sick of suffering.  Plan:   Continue vent support for now, awaiting family Continue nebulized BDs  Continue in-line speaking valve trials if he changes his mind and wants to cont vent support Started vanc 1/20, changed to ancef-1/22 and plan 14 days total Will assess cpap 5 ps 5 , for morphine needs likely for comfort care today Will need likely morphine infusion started prior, especially with pain reported  Hx of CAD, HTN, HLD, CHF, PVD. PAF this hospitalization. Deemed poor candidate for chronic anticoagulation. Plan:  Continue ASA, lipitor Holding clopidogrel  Continue amiodarone 200 mg daily Continue lopressor   Acute on chronic renal failure (baseline scr 1.35)-->stable on 1/20 Persistent Hypernatremia-->improved BPH, prostate cancer. Hyperkalemia variable/mild/ no treatment needed  Plan:   Holding Proscar, flomax, tasigna for now  Tracheostomy associated dysphagia. Hx of GERD. S/p PEG 1/11. Plan:   SUP: Famotidine Resumed Tube feeds- if for comfort , will dc.  Acute metabolic encephalopathy, resolved. Chronic pain > improved 1/19 with addition of fentanyl patch and scheduled Apap as advised by Palliative  Chronic HA- seems to be occipital, radiating bilat to vertex. Severe deconditioning. 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 Plan:  Lovenox for DVT prevention - likely  to dc if for full comfort care Monitor CBC intermittently  DM 2, controlled. Hx of gout. P:   Continue SSI    Awaiting fm to arrive re terminal weaning - in meantime no escalation of care anticipated   Called daughter and updated sister in room. Await response from daughter to change to full NCB and start full comfort care  Lavon Paganini. Titus Mould, MD, [place] Pgr: [facility]

## 2014-02-11 NOTE — Progress Notes (Signed)
ANTIBIOTIC CONSULT NOTE - Follow-up  Pharmacy Consult for Ancef Indication: pneumonia (MSSA)  No Known Allergies  Patient Measurements: Height: 5\' 11"  (180.3 cm) Weight: 205 lb 14.6 oz (93.4 kg) IBW/kg (Calculated) : 75.3 Vital Signs: Temp: 97.7 F (36.5 C) (01/25 0754) Temp Source: Axillary (01/25 0754) BP: 123/59 mmHg (01/25 1015) Pulse Rate: 59 (01/25 1015) Intake/Output from previous day: 01/24 0701 - 01/25 0700 In: 490 [NG/GT:390] Out: 1150 [Urine:1150] Intake/Output from this shift:    Labs: No results for input(s): WBC, HGB, PLT, LABCREA, CREATININE in the last 72 hours. Estimated Creatinine Clearance: 55.6 mL/min (by C-G formula based on Cr of 1.38). No results for input(s): VANCOTROUGH, VANCOPEAK, VANCORANDOM, GENTTROUGH, GENTPEAK, GENTRANDOM, TOBRATROUGH, TOBRAPEAK, TOBRARND, AMIKACINPEAK, AMIKACINTROU, AMIKACIN in the last 72 hours.   Microbiology: Recent Results (from the past 720 hour(s))  Culture, respiratory (NON-Expectorated)     Status: None   Collection Time: 01/08/14 11:22 AM  Result Value Ref Range Status   Specimen Description TRACHEAL ASPIRATE  Final   Special Requests Normal  Final   Gram Stain   Final    FEW WBC PRESENT,BOTH PMN AND MONONUCLEAR RARE SQUAMOUS EPITHELIAL CELLS PRESENT NO ORGANISMS SEEN Performed at [facility]    Culture   Final    Non-Pathogenic Oropharyngeal-type Flora Isolated. Performed at [facility]    Report Status 01/10/2014 FINAL  Final  Clostridium Difficile by PCR     Status: None   Collection Time: 01/14/14 11:35 PM  Result Value Ref Range Status   C difficile by pcr NEGATIVE NEGATIVE Final  Culture, bal-quantitative     Status: None   Collection Time: 01/16/14  5:20 PM  Result Value Ref Range Status   Specimen Description BRONCHIAL ALVEOLAR LAVAGE  Final   Special Requests NONE  Final   Gram Stain   Final    FEW WBC PRESENT,BOTH PMN AND MONONUCLEAR RARE SQUAMOUS EPITHELIAL CELLS PRESENT NO  ORGANISMS SEEN Performed at [place]   Final    50,000 COLONIES/ML Performed at [facility]    Culture   Final    STAPHYLOCOCCUS AUREUS Note: RIFAMPIN AND GENTAMICIN SHOULD NOT BE USED AS SINGLE DRUGS FOR TREATMENT OF STAPH INFECTIONS. This organism DOES NOT demonstrate inducible Clindamycin resistance in vitro. Performed at [facility]    Report Status 01/20/2014 FINAL  Final   Organism ID, Bacteria STAPHYLOCOCCUS AUREUS  Final      Susceptibility   Staphylococcus aureus - MIC*    CLINDAMYCIN <=0.25 SENSITIVE Sensitive     ERYTHROMYCIN >=8 RESISTANT Resistant     GENTAMICIN <=0.5 SENSITIVE Sensitive     LEVOFLOXACIN >=8 RESISTANT Resistant     OXACILLIN 0.5 SENSITIVE Sensitive     PENICILLIN >=0.5 RESISTANT Resistant     RIFAMPIN <=0.5 SENSITIVE Sensitive     TRIMETH/SULFA <=10 SENSITIVE Sensitive     VANCOMYCIN 1 SENSITIVE Sensitive     TETRACYCLINE <=1 SENSITIVE Sensitive     MOXIFLOXACIN 2 RESISTANT Resistant     * STAPHYLOCOCCUS AUREUS  Culture, Urine     Status: None   Collection Time: 01/16/14  8:58 PM  Result Value Ref Range Status   Specimen Description URINE, SUPRAPUBIC  Final   Special Requests NONE  Final   Colony Count   Final    >=100,000 COLONIES/ML Performed at [facility]    Culture   Final    Multiple bacterial morphotypes present, none predominant. Suggest appropriate recollection if clinically indicated.  Performed at [facility]    Report Status 01/17/2014 FINAL  Final  Culture, blood (routine x 2)     Status: None   Collection Time: 01/16/14 10:46 PM  Result Value Ref Range Status   Specimen Description BLOOD RIGHT ARM  Final   Special Requests BOTTLES DRAWN AEROBIC AND ANAEROBIC 10CC  Final   Culture   Final    NO GROWTH 5 DAYS Performed at [facility]    Report Status 01/23/2014 FINAL  Final  Culture, blood (routine x 2)     Status: None   Collection Time: 01/16/14  10:52 PM  Result Value Ref Range Status   Specimen Description BLOOD RIGHT HAND  Final   Special Requests BOTTLES DRAWN AEROBIC ONLY [place]  Final   Culture   Final    NO GROWTH 5 DAYS Performed at [facility]    Report Status 01/23/2014 FINAL  Final  Culture, respiratory (NON-Expectorated)     Status: None   Collection Time: 01/28/14 12:05 PM  Result Value Ref Range Status   Specimen Description TRACHEAL ASPIRATE  Final   Special Requests NONE  Final   Gram Stain   Final    FEW WBC PRESENT,BOTH PMN AND MONONUCLEAR RARE SQUAMOUS EPITHELIAL CELLS PRESENT RARE GRAM POSITIVE COCCI IN PAIRS Performed at [facility]    Culture   Final    FEW STAPHYLOCOCCUS AUREUS Note: RIFAMPIN AND GENTAMICIN SHOULD NOT BE USED AS SINGLE DRUGS FOR TREATMENT OF STAPH INFECTIONS. This organism DOES NOT demonstrate inducible Clindamycin resistance in vitro. Performed at [facility]    Report Status 02/01/2014 FINAL  Final   Organism ID, Bacteria STAPHYLOCOCCUS AUREUS  Final      Susceptibility   Staphylococcus aureus - MIC*    CLINDAMYCIN <=0.25 SENSITIVE Sensitive     ERYTHROMYCIN >=8 RESISTANT Resistant     GENTAMICIN <=0.5 SENSITIVE Sensitive     LEVOFLOXACIN >=8 RESISTANT Resistant     OXACILLIN 0.5 SENSITIVE Sensitive     PENICILLIN >=0.5 RESISTANT Resistant     RIFAMPIN <=0.5 SENSITIVE Sensitive     TRIMETH/SULFA <=10 SENSITIVE Sensitive     VANCOMYCIN 1 SENSITIVE Sensitive     TETRACYCLINE <=1 SENSITIVE Sensitive     MOXIFLOXACIN 2 RESISTANT Resistant     * FEW STAPHYLOCOCCUS AUREUS    Assessment: 74 year old male on Ancef for MSSA in trach 1/18. Afeb. WBC down to 14.2 on 1/21. Afeb.  **Plan for terminal wean today**  Goal of Therapy:  clinical resolution of infection  Plan:  1. Ancef 1gm IV q8h 2. Noted plan for terminal wean today when all family arrives.  Sherlon Handing, PharmD, BCPS Clinical pharmacist, pager [phone] 2014/02/07,10:57 AM

## 2014-02-11 NOTE — Progress Notes (Signed)
NUTRITION FOLLOW UP  Intervention:   Awaiting on family decision per care: For now continue Vital AF 1.2 at 65 ml/h to provide 1872 kcals, 117 gm protein, 1265 ml free water daily.  Continue free water flushes.  Nutrition Dx:   Inadequate oral intake related to inability to eat as evidenced by NPO status, ongoing.  Goal:   Intake to meet >90% of estimated nutrition needs. Met  Monitor:   TF tolerance/adequacy, weight trend, labs, respiratory status.  Assessment:   74yo male with extensive PMH including hx CML, prostate ca, HTN, chronic dCHF, chronic suprapubic catheter, DM and CKD presented 12/20 from SNF with concern for "kidney problems". Admitted by [facility] teaching service with acute on chronic renal failure with hyperkalemia, HCAP +/- worsening R effusion and UTI. PCCM consulted r/t respiratory acidosis and increasing somnolence.   Tracheostomy 1/5.  PEG placement 1/11.  -Pt with Vital AF 1.2 infusing at goal rate of 65 ml/hr. Pt has been tolerating tube feedings. Currently awaiting family decision as they are now considering palliative care. Final decision to be determined today. For now will continue with current tube feeding orders.  Patient is currently intubated on ventilator support MV: 13.1, 11.4 L/min Temp (24hrs), Avg:98.6 F (37 C), Min:97.7 F (36.5 C), Max:99.2 F (37.3 C)  Propofol: none  Labs and medications reviewed.  Height: Ht Readings from Last 1 Encounters:  01/08/14 '5\' 11"'  (1.803 m)    Weight Status:   Wt Readings from Last 1 Encounters:  02-11-2014 205 lb 14.6 oz (93.4 kg)   01/08/14 211 lb 6.7 oz (95.9 kg)   01/02/14 223 lb 12.3 oz (101.5 kg)  *weight trending down*  Body mass index is 28.73 kg/(m^2).  Re-estimated needs:  Kcal: 1757 Protein: 115-125 gm Fluid: 2 L  Skin: skin tear to buttocks, +1 generalized edema  Diet Order:   NPO   Intake/Output Summary (Last 24 hours) at 2014/02/11 1400 Last data filed at 2014-02-11 1300  Gross  per 24 hour  Intake   2195 ml  Output   1450 ml  Net    745 ml    Last BM: 1/24   Labs:   Recent Labs Lab 01/30/14 0900  01/31/14 0658  02/01/14 0600  02/02/14 0619 02/02/14 1110 02/02/14 1458  NA 133*  --  136  --  134*  --   --   --   --   K 6.7*  < > 5.0  < > 4.7  < > 4.9 4.9 5.6*  CL 98  --  100  --  96  --   --   --   --   CO2 26  --  30  --  31  --   --   --   --   BUN 57*  --  57*  --  58*  --   --   --   --   CREATININE 1.34  --  1.33  --  1.38*  --   --   --   --   CALCIUM 8.8  --  8.4  --  8.0*  --   --   --   --   GLUCOSE 134*  --  177*  --  139*  --   --   --   --   < > = values in this interval not displayed.  CBG (last 3)   Recent Labs  2014/02/11 0028 Feb 11, 2014 0434 11-Feb-2014 0752  GLUCAP 127* 109* 142*  Scheduled Meds: . acetaminophen (TYLENOL) oral liquid 160 mg/5 mL  650 mg Per Tube 4 times per day  . albuterol  2.5 mg Nebulization Q6H  . amiodarone  200 mg Oral Daily  . antiseptic oral rinse  7 mL Mouth Rinse QID  . aspirin  81 mg Per Tube Daily  . atorvastatin  20 mg Per Tube q1800  .  ceFAZolin (ANCEF) IV  1 g Intravenous 3 times per day  . chlorhexidine  15 mL Mouth Rinse BID  . enoxaparin (LOVENOX) injection  40 mg Subcutaneous Q24H  . famotidine  20 mg Per Tube BID  . fentaNYL  75 mcg Transdermal Q72H  . free water  300 mL Per Tube Q4H  . insulin aspart  0-15 Units Subcutaneous 6 times per day  . metoprolol  25 mg Per Tube BID  . sodium chloride  10-40 mL Intracatheter Q12H    Continuous Infusions: . feeding supplement (VITAL AF 1.2 CAL) 1,000 mL (02/03/14 1823)     Kallie Locks, MS, RD, LDN Pager # [phone] After hours/ weekend pager # [phone]

## 2014-02-11 NOTE — Progress Notes (Signed)
Patient no longer breathing.  No heart rate or respiratory rate noted on monitor. No heart sounds on auscultation.  Confirmed by this nurse and Latricia Heft, RN.  Family present at bedside.

## 2014-02-11 NOTE — Progress Notes (Signed)
Numerous family members here holding pt's hand, talking , Comfort cart here for pt's family call to spiritual care Family asked for Bible and given.

## 2014-02-11 NOTE — Progress Notes (Signed)
Pt grabbing staffs hand - frowning looks sad & is a little tearful. Hand at trach site saying no.  Spoke to pt comfroting and told him I could give him pain meds - nodded  head yes

## 2014-02-11 NOTE — Progress Notes (Signed)
Chaplain visited with Mr. Lefebre family and prayed with them concerning his passing.   Family requested prayer, "a prayer for strength to get through this".   Family is gathered around Mr. Rorke consoling and supporting one another, while sharing parting words with him.   Pt's niece noted that this is particularly difficult for the family to accept. Family is currently grieving. Emotional and spiritual support given.   Delford Field, Chaplain 02/08/14

## 2014-02-11 DEATH — deceased

## 2014-02-12 NOTE — Discharge Summary (Signed)
Dale Taylor, Dale Taylor                 ACCOUNT NO.:  [id number]  MEDICAL RECORD NO.:  [id number]  LOCATION:  2C09C                        FACILITY:  [facility]  PHYSICIAN:  Raylene Miyamoto, MD DATE OF BIRTH:  [birth year]  DATE OF ADMISSION:  12/26/2013 DATE OF DISCHARGE:  February 10, 2014                              DISCHARGE SUMMARY   DEATH SUMMARY  This is a 74 year old African American male with history of chronic renal insufficiency, prostate cancer admitted with acute on chronic renal failure with hyperkalemia, hospital-acquired pneumonia, and urinary tract infection.  Had a long hospital course in the intensive care unit after being transferred to the ICU at [facility] with respiratory acidosis, hypersomnolence.  PCCM was consulted at that time, was knee initially treated with noninvasive mask ventilation, but failed that and required intubation, had paroxysmal atrial fibrillation also with rapid ventricular response being treated with amiodarone. Hospitalization significant for basically re-occurrent mucus plugging, collapse of the right hemithorax requiring re-occurrent  bronchoscopy first on January 07, 2015, and repeated again throughout the hospitalization with re-occurrent pneumonia, re-occurrent collapse.  The patient required tracheostomy and PEG tube placement and continued to not progress, was transferred to the step-down unit where he essentially met his wishes to not want to be continued on his current circumstances of multiorgan failure, tracheostomy, PEG tube feeding, and very poor quality of life with very poor prognosis for progressing.  His Infectious Disease did grow respiratory culture with MSSA, which is being treated aggressively.  The patient opted for comfort care and expired.  FINAL DIAGNOSES UPON DEATH: 1. Pneumonia. 2. Re-occurrent lung collapse. 3. Acute on chronic renal failure. 4. Status post tracheostomy. 5. Status post PEG secondary to  dysphagia secondary to tracheostomy.     Raylene Miyamoto, MD     DJF/MEDQ  D:  02/11/2014  T:  02/12/2014  Job:  [id number]

## 2016-09-21 IMAGING — CR DG CHEST 1V PORT
1 series · 1 of 1 positions shown · non-contrast
Comparison: 12/30/2013 and earlier.

CLINICAL DATA: 73-year-old male with respiratory failure,
Healthcare associated pneumonia. Remote history of right lower
lobectomy. Chronic heart failure. Initial encounter.

EXAM:
PORTABLE CHEST - 1 VIEW

[AP]
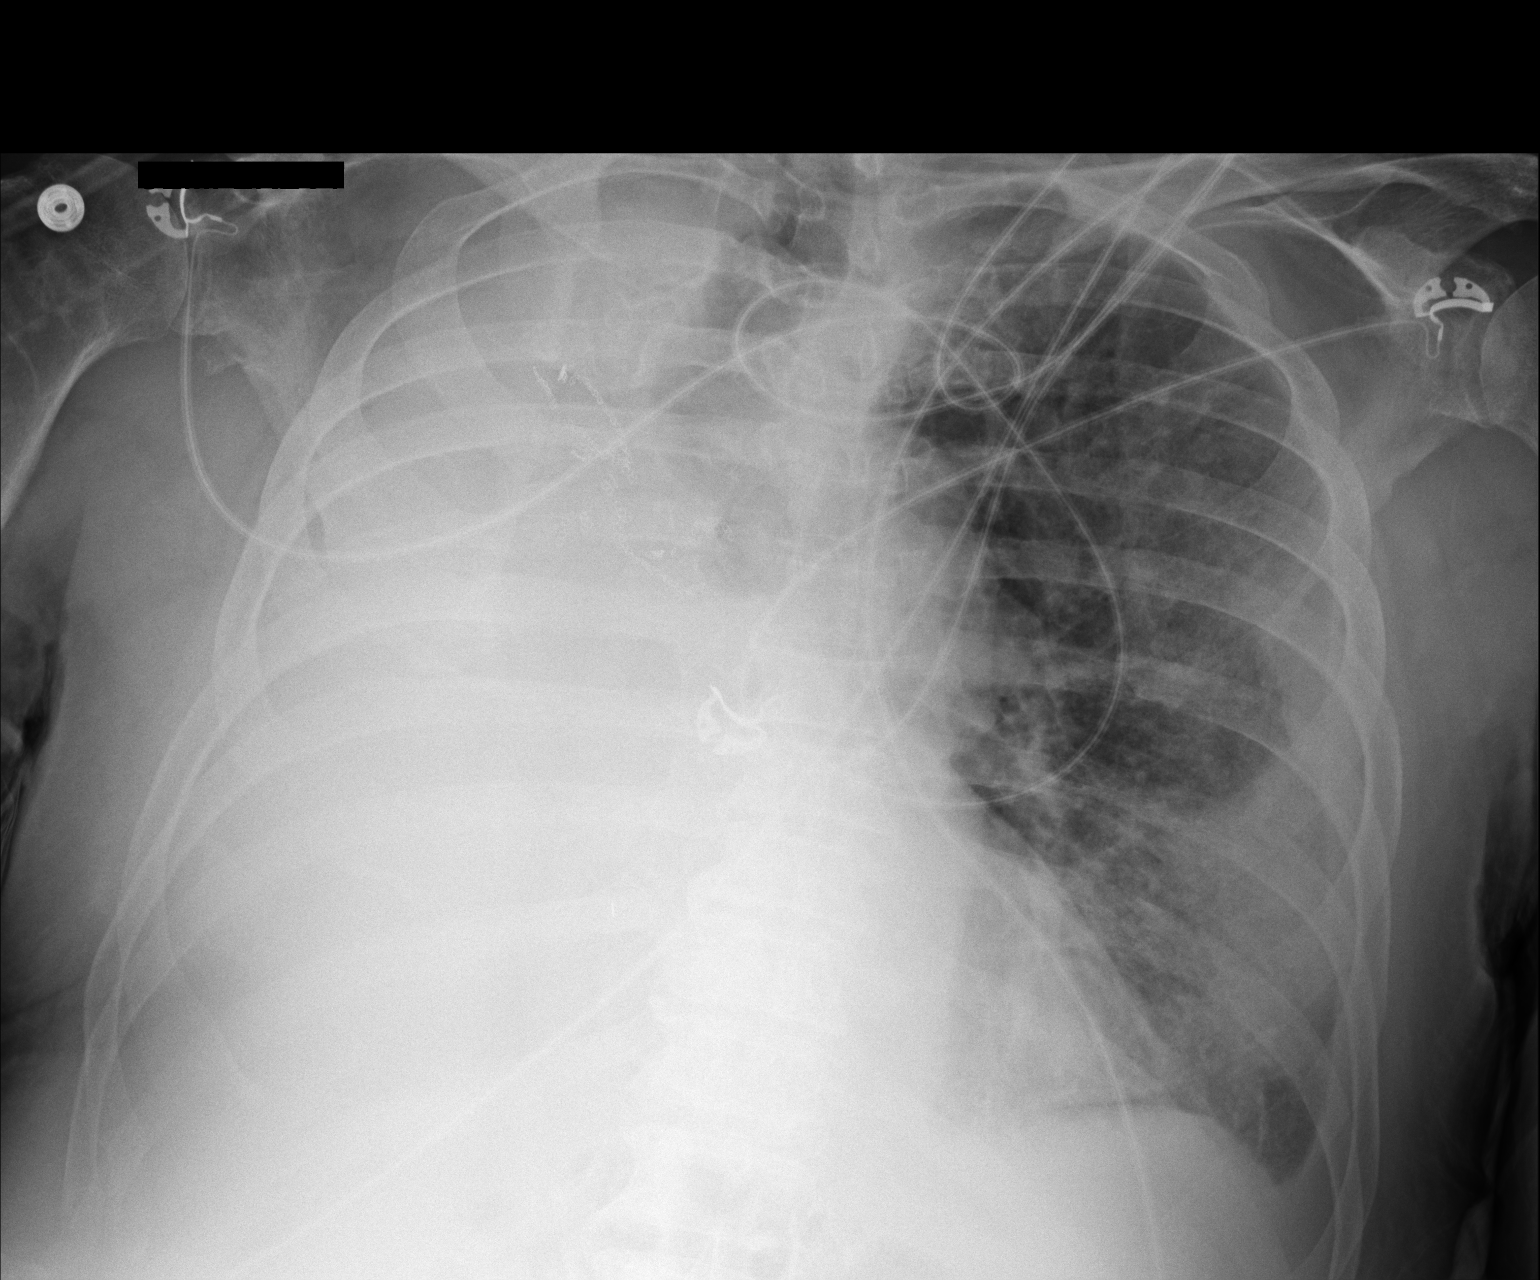

[1 of 1 positions shown; findings below may reference images not displayed]

FINDINGS: Portable AP semi upright view at 6711 hrs. Interval recurrent
complete opacification of the right hemi thorax. Staple lines re-
identified about the right hilum. New confluent peripheral left
lower lung opacity, superimposed on small partially loculated
pleural effusion (as seen on 12/01/2013 CT). No superimposed
pneumothorax. Visualized mediastinal contours are stable. Stable
tracheal air column.
IMPRESSION: 1. Recurrent complete opacification of the right hemi thorax.
2. New confluent left lower lung opacity suspicious for aspiration
or progression of pneumonia.

## 2016-09-26 IMAGING — CR DG CHEST 1V PORT
1 series · 1 of 1 positions shown · non-contrast
Comparison: 01/05/2014

CLINICAL DATA: Mucous plugging lobe bronchi

EXAM:
PORTABLE CHEST - 1 VIEW

[portable]
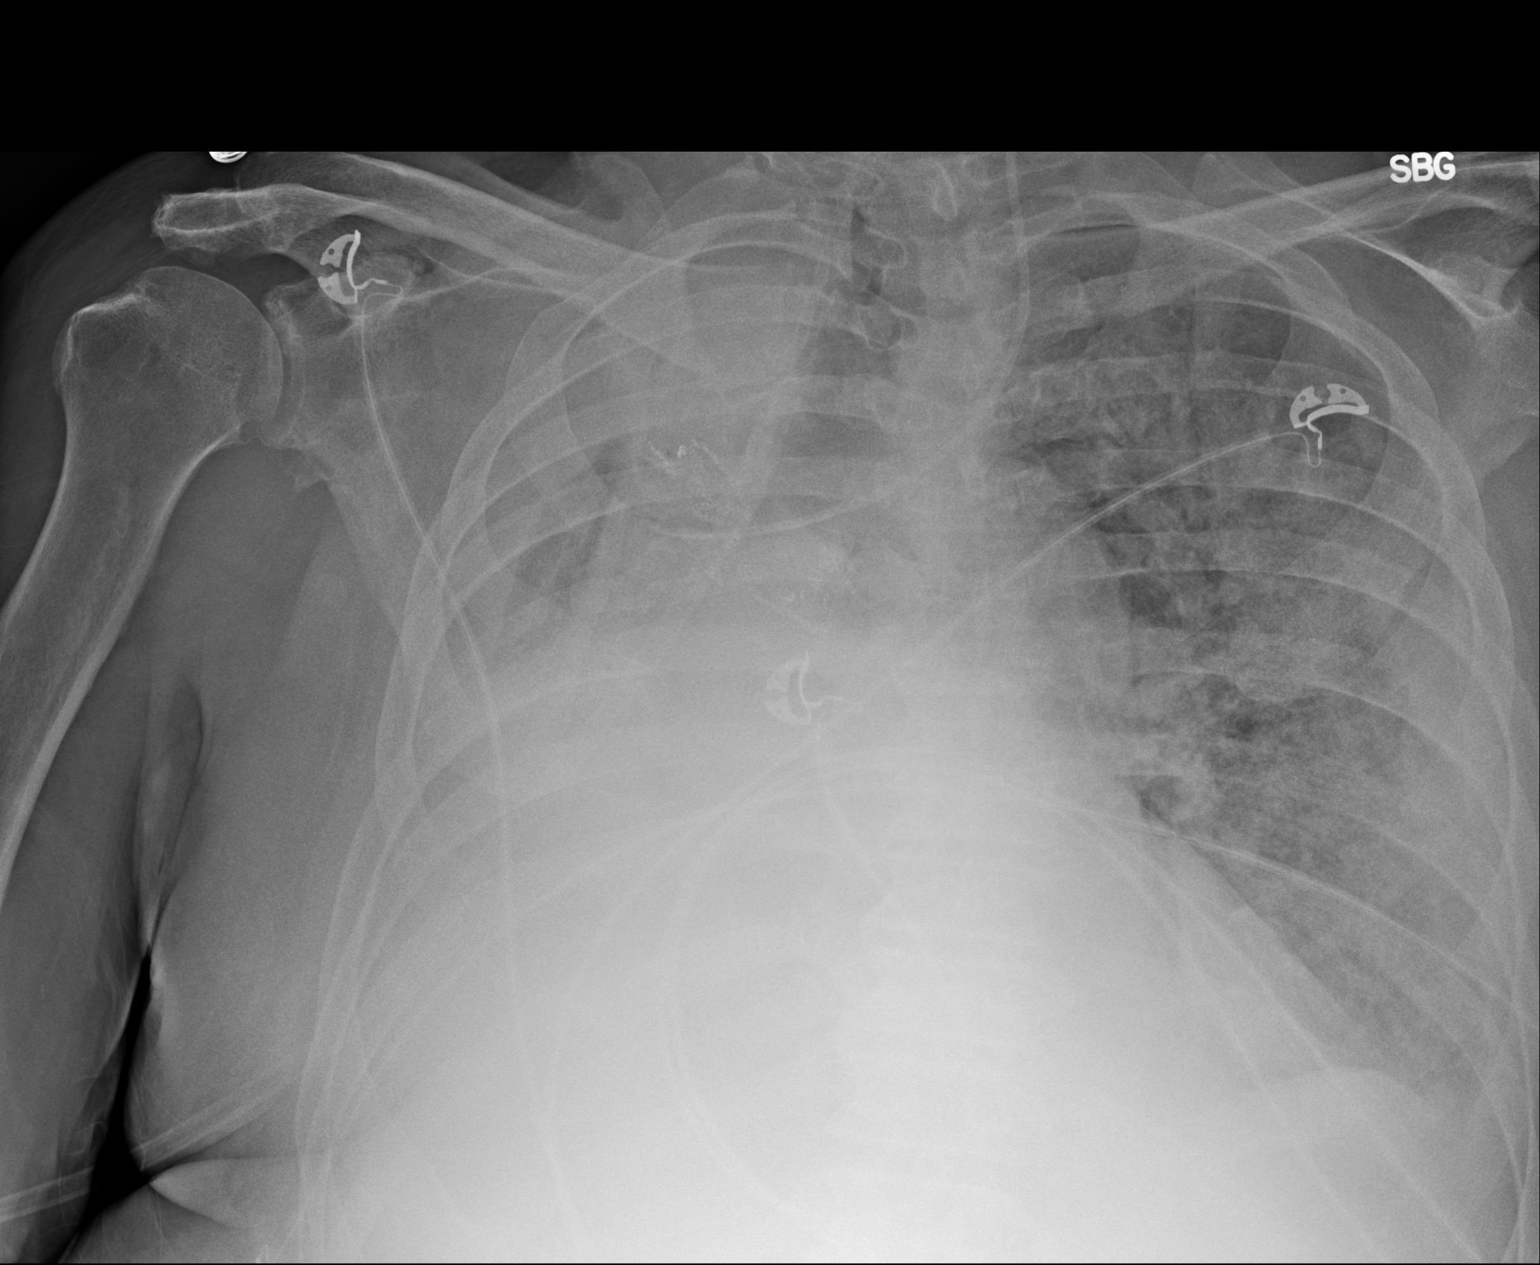

[1 of 1 positions shown; findings below may reference images not displayed]

FINDINGS: Cardiomediastinal silhouette is stable. Persistent almost complete
opacification of the right hemi thorax. Stable postsurgical changes
right perihilar region. There is worsening in aeration of left lung
probable due to edema, infiltrate with small left pleural effusion.
Stable left IJ catheter position. Stable loculated peripheral right
pleural effusion.
IMPRESSION: Persistent almost complete opacification of the right hemi thorax.
Stable postsurgical changes right perihilar region. There is
worsening in aeration of left lung probable due to edema or
infiltrate with small left pleural effusion.

## 2016-09-27 IMAGING — CR DG CHEST 1V PORT
1 series · 1 of 1 positions shown · non-contrast
Comparison: 01/06/2014.

CLINICAL DATA: Mucous plugging.

EXAM:
PORTABLE CHEST - 1 VIEW

[AP]
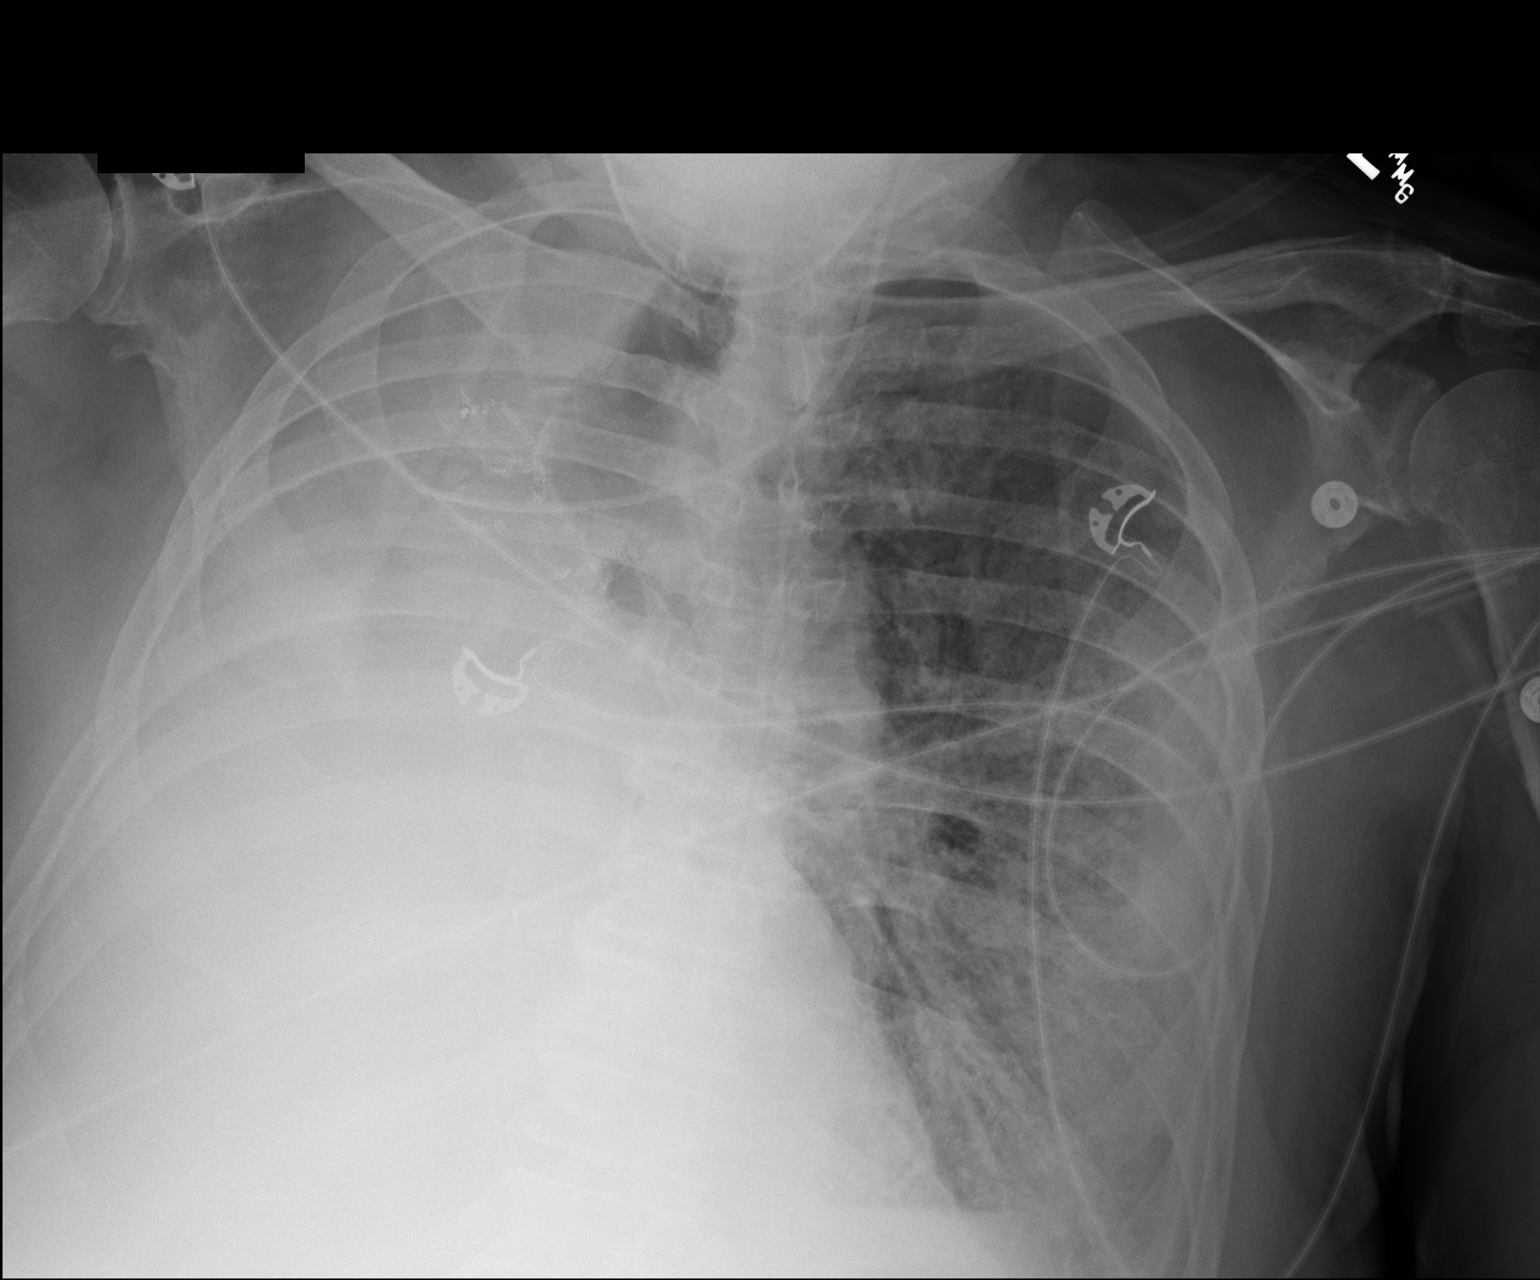

[1 of 1 positions shown; findings below may reference images not displayed]

FINDINGS: Left IJ line in stable position. Postsurgical changes right hilum.
Progressive atelectasis right lung. Right pleural effusion. Diffuse
mild infiltrate left lung. Heart size is difficult to evaluate due
to atelectasis of the right lung and shift of the heart mediastinum
to the right. No acute bony abnormality.
IMPRESSION: 1. Left IJ line in stable position.
2. Progressive atelectasis of the right lung. Persistent right
pleural effusion.
3. Diffuse left lung pulmonary infiltrate.

## 2016-10-12 IMAGING — CR DG CHEST 1V PORT
2 series · 2 of 2 positions shown · non-contrast
Comparison: 01/19/2014, 01/18/2014.

CLINICAL DATA: Shortness of breath.  Pneumonia.

EXAM:
PORTABLE CHEST - 1 VIEW

[AP (1 of 2)]
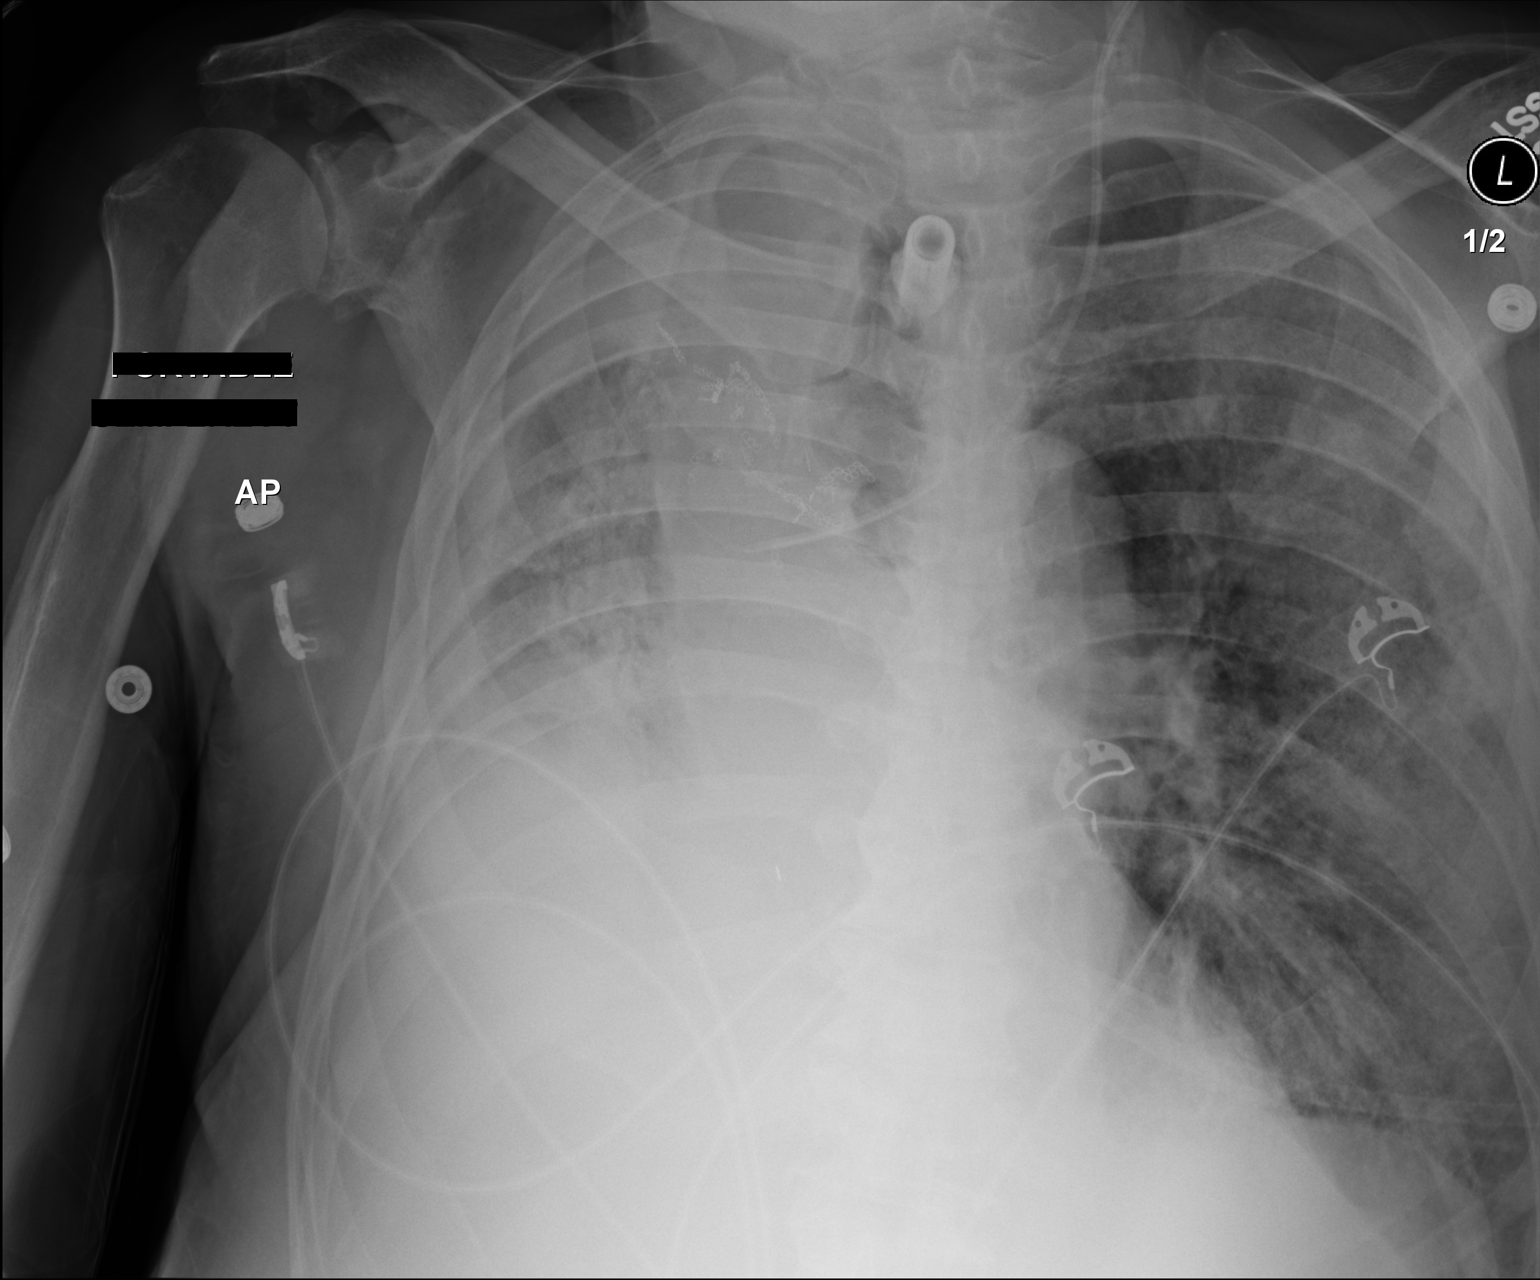

[AP (2 of 2)]
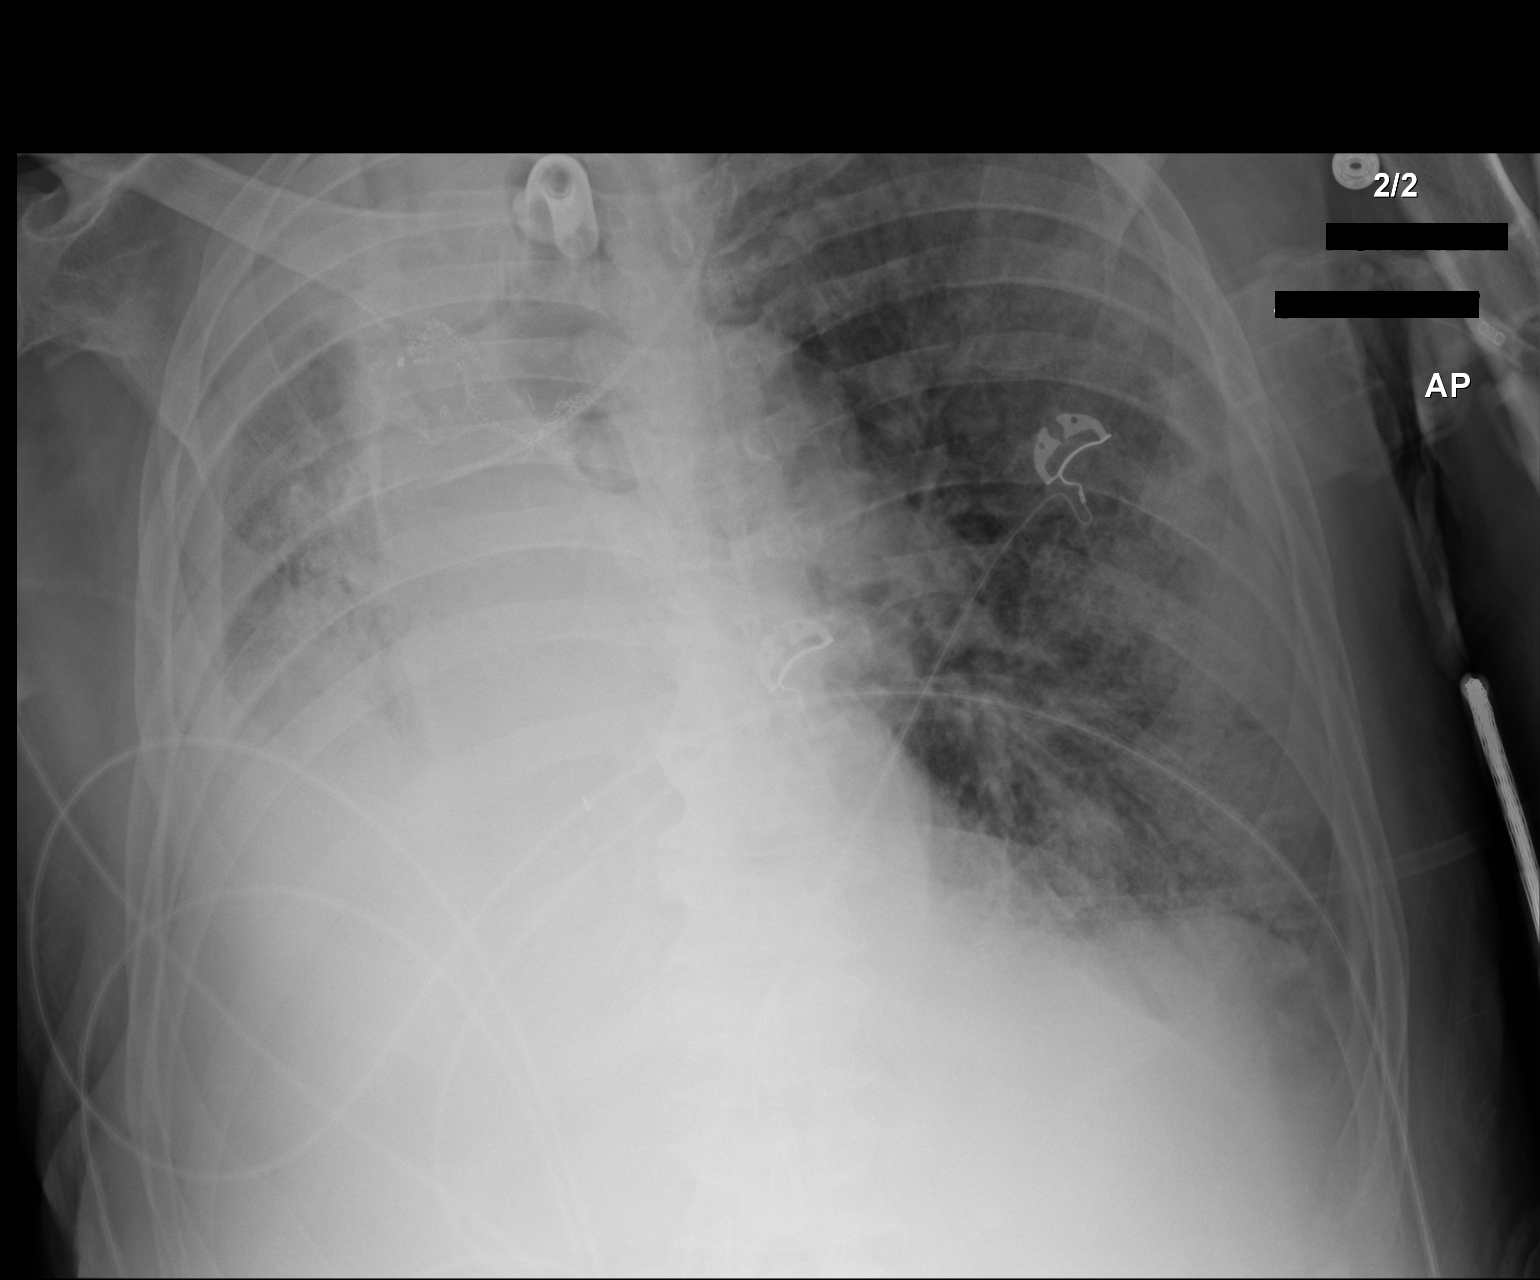

[2 of 2 positions shown; findings below may reference images not displayed]

FINDINGS: Tracheostomy tube and left IJ line in stable position. Surgical
sutures noted over the right perihilar and upper lobe region.
Persistent opacification right in the thorax secondary to prominent
right pleural effusion and right lung atelectasis and/or infiltrate.
Persistent patchy infiltrates in the left upper and left lower
lobes. No evidence of cardiomegaly or pulmonary venous congestion.
No pneumothorax. No acute osseous abnormality.
IMPRESSION: 1. Tracheostomy tube and left IJ line stable position.
2. Postsurgical changes right upper lobe.
3. Persistent opacification of the right hemithorax secondary to
large right pleural effusion and right lung atelectasis/infiltrate .
4. Patchy infiltrates again noted in the left upper and left lower
lobes.
